# Patient Record
Sex: Male | Born: 1965
Health system: Southern US, Community
[De-identification: ages and names within clinical notes are randomized; demographics above are authoritative.]

## PROBLEM LIST (undated history)

## (undated) DIAGNOSIS — R7303 Prediabetes: Secondary | ICD-10-CM

## (undated) DIAGNOSIS — N529 Male erectile dysfunction, unspecified: Secondary | ICD-10-CM

## (undated) DIAGNOSIS — Z973 Presence of spectacles and contact lenses: Secondary | ICD-10-CM

## (undated) DIAGNOSIS — K219 Gastro-esophageal reflux disease without esophagitis: Secondary | ICD-10-CM

## (undated) DIAGNOSIS — G8929 Other chronic pain: Secondary | ICD-10-CM

## (undated) DIAGNOSIS — I10 Essential (primary) hypertension: Secondary | ICD-10-CM

## (undated) DIAGNOSIS — C61 Malignant neoplasm of prostate: Secondary | ICD-10-CM

## (undated) DIAGNOSIS — M1990 Unspecified osteoarthritis, unspecified site: Secondary | ICD-10-CM

## (undated) DIAGNOSIS — N1330 Unspecified hydronephrosis: Secondary | ICD-10-CM

## (undated) DIAGNOSIS — C801 Malignant (primary) neoplasm, unspecified: Secondary | ICD-10-CM

## (undated) DIAGNOSIS — M1711 Unilateral primary osteoarthritis, right knee: Secondary | ICD-10-CM

## (undated) HISTORY — PX: CHOLECYSTECTOMY: SHX55

## (undated) HISTORY — DX: Malignant (primary) neoplasm, unspecified: C80.1

## (undated) HISTORY — DX: Essential (primary) hypertension: I10

---

## 2003-10-16 ENCOUNTER — Encounter: Payer: Self-pay | Admitting: Emergency Medicine

## 2003-10-16 ENCOUNTER — Encounter: Payer: Self-pay | Admitting: Internal Medicine

## 2003-10-16 ENCOUNTER — Inpatient Hospital Stay (HOSPITAL_COMMUNITY): Admission: EM | Admit: 2003-10-16 | Discharge: 2003-10-18 | Payer: Self-pay | Admitting: Emergency Medicine

## 2003-10-17 ENCOUNTER — Encounter: Payer: Self-pay | Admitting: Cardiology

## 2003-12-14 ENCOUNTER — Emergency Department (HOSPITAL_COMMUNITY): Admission: EM | Admit: 2003-12-14 | Discharge: 2003-12-15 | Payer: Self-pay | Admitting: *Deleted

## 2003-12-30 ENCOUNTER — Emergency Department (HOSPITAL_COMMUNITY): Admission: AD | Admit: 2003-12-30 | Discharge: 2003-12-30 | Payer: Self-pay | Admitting: Family Medicine

## 2006-01-01 ENCOUNTER — Ambulatory Visit: Payer: Self-pay | Admitting: Nurse Practitioner

## 2006-01-13 ENCOUNTER — Ambulatory Visit: Payer: Self-pay | Admitting: Gastroenterology

## 2006-01-27 ENCOUNTER — Ambulatory Visit: Payer: Self-pay | Admitting: Nurse Practitioner

## 2006-01-30 ENCOUNTER — Ambulatory Visit: Payer: Self-pay | Admitting: *Deleted

## 2006-02-04 ENCOUNTER — Encounter (INDEPENDENT_AMBULATORY_CARE_PROVIDER_SITE_OTHER): Payer: Self-pay | Admitting: Specialist

## 2006-02-04 ENCOUNTER — Ambulatory Visit: Payer: Self-pay | Admitting: Gastroenterology

## 2006-02-04 HISTORY — PX: COLONOSCOPY: SHX174

## 2007-08-02 ENCOUNTER — Emergency Department (HOSPITAL_COMMUNITY): Admission: EM | Admit: 2007-08-02 | Discharge: 2007-08-02 | Payer: Self-pay | Admitting: Emergency Medicine

## 2007-08-30 ENCOUNTER — Emergency Department (HOSPITAL_COMMUNITY): Admission: EM | Admit: 2007-08-30 | Discharge: 2007-08-30 | Payer: Self-pay | Admitting: Emergency Medicine

## 2007-10-21 ENCOUNTER — Ambulatory Visit: Payer: Self-pay | Admitting: Internal Medicine

## 2008-01-14 ENCOUNTER — Ambulatory Visit: Payer: Self-pay | Admitting: Internal Medicine

## 2008-01-17 ENCOUNTER — Ambulatory Visit: Payer: Self-pay | Admitting: Internal Medicine

## 2008-01-20 ENCOUNTER — Ambulatory Visit (HOSPITAL_COMMUNITY): Admission: RE | Admit: 2008-01-20 | Discharge: 2008-01-20 | Payer: Self-pay | Admitting: General Surgery

## 2008-01-20 ENCOUNTER — Encounter (INDEPENDENT_AMBULATORY_CARE_PROVIDER_SITE_OTHER): Payer: Self-pay | Admitting: General Surgery

## 2008-01-20 HISTORY — PX: LAPAROSCOPIC CHOLECYSTECTOMY: SUR755

## 2008-04-21 ENCOUNTER — Ambulatory Visit: Payer: Self-pay | Admitting: Internal Medicine

## 2008-04-21 ENCOUNTER — Encounter (INDEPENDENT_AMBULATORY_CARE_PROVIDER_SITE_OTHER): Payer: Self-pay | Admitting: Nurse Practitioner

## 2008-04-21 LAB — CONVERTED CEMR LAB
ALT: 72 units/L — ABNORMAL HIGH (ref 0–53)
AST: 36 units/L (ref 0–37)
Albumin: 4.4 g/dL (ref 3.5–5.2)
Alkaline Phosphatase: 83 units/L (ref 39–117)
Basophils Absolute: 0.1 10*3/uL (ref 0.0–0.1)
Basophils Relative: 1 % (ref 0–1)
Eosinophils Absolute: 0.2 10*3/uL (ref 0.0–0.7)
Eosinophils Relative: 2 % (ref 0–5)
HCT: 49.8 % (ref 39.0–52.0)
LDL Cholesterol: 86 mg/dL (ref 0–99)
MCV: 90.5 fL (ref 78.0–100.0)
Neutrophils Relative %: 53 % (ref 43–77)
Platelets: 282 10*3/uL (ref 150–400)
Potassium: 5.2 meq/L (ref 3.5–5.3)
RDW: 13.5 % (ref 11.5–15.5)
Sodium: 137 meq/L (ref 135–145)
TSH: 0.9 microintl units/mL (ref 0.350–5.50)
Total Bilirubin: 0.5 mg/dL (ref 0.3–1.2)
Total Protein: 8.3 g/dL (ref 6.0–8.3)
Triglycerides: 115 mg/dL (ref <150)
VLDL: 23 mg/dL (ref 0–40)
WBC: 7.6 10*3/uL (ref 4.0–10.5)

## 2008-09-12 ENCOUNTER — Emergency Department (HOSPITAL_COMMUNITY): Admission: EM | Admit: 2008-09-12 | Discharge: 2008-09-13 | Payer: Self-pay | Admitting: Emergency Medicine

## 2009-10-10 ENCOUNTER — Emergency Department (HOSPITAL_COMMUNITY): Admission: EM | Admit: 2009-10-10 | Discharge: 2009-10-10 | Payer: Self-pay | Admitting: Emergency Medicine

## 2009-10-13 ENCOUNTER — Emergency Department (HOSPITAL_COMMUNITY): Admission: EM | Admit: 2009-10-13 | Discharge: 2009-10-13 | Payer: Self-pay | Admitting: Emergency Medicine

## 2010-02-26 ENCOUNTER — Observation Stay (HOSPITAL_COMMUNITY): Admission: EM | Admit: 2010-02-26 | Discharge: 2010-02-28 | Payer: Self-pay | Admitting: Emergency Medicine

## 2010-02-26 ENCOUNTER — Ambulatory Visit: Payer: Self-pay | Admitting: Internal Medicine

## 2010-02-28 ENCOUNTER — Encounter: Payer: Self-pay | Admitting: Internal Medicine

## 2011-03-23 LAB — COMPREHENSIVE METABOLIC PANEL
ALT: 40 U/L (ref 0–53)
AST: 31 U/L (ref 0–37)
Alkaline Phosphatase: 92 U/L (ref 39–117)
CO2: 25 mEq/L (ref 19–32)
Calcium: 9.1 mg/dL (ref 8.4–10.5)
Chloride: 100 mEq/L (ref 96–112)
GFR calc non Af Amer: >60 mL/min (ref >60)
Glucose, Bld: 81 mg/dL (ref 70–99)
Sodium: 137 mEq/L (ref 135–145)
Total Bilirubin: 1 mg/dL (ref 0.3–1.2)

## 2011-03-23 LAB — LIPID PANEL
HDL: 67 mg/dL (ref >39)
Total CHOL/HDL Ratio: 1.7 RATIO
Triglycerides: 67 mg/dL (ref <150)
VLDL: 13 mg/dL (ref 0–40)

## 2011-03-23 LAB — CBC
HCT: 47.5 % (ref 39.0–52.0)
Hemoglobin: 16.3 g/dL (ref 13.0–17.0)
Hemoglobin: 16.5 g/dL (ref 13.0–17.0)
MCHC: 34 g/dL (ref 30.0–36.0)
MCV: 90.7 fL (ref 78.0–100.0)
RBC: 5.15 MIL/uL (ref 4.22–5.81)
RBC: 5.36 MIL/uL (ref 4.22–5.81)

## 2011-03-23 LAB — POCT I-STAT, CHEM 8
Calcium, Ion: 1.03 mmol/L — ABNORMAL LOW (ref 1.12–1.32)
Chloride: 104 mEq/L (ref 96–112)
HCT: 53 % — ABNORMAL HIGH (ref 39.0–52.0)
Hemoglobin: 18 g/dL — ABNORMAL HIGH (ref 13.0–17.0)
Potassium: 3.4 mEq/L — ABNORMAL LOW (ref 3.5–5.1)

## 2011-03-23 LAB — BASIC METABOLIC PANEL
BUN: 11 mg/dL (ref 6–23)
CO2: 25 mEq/L (ref 19–32)
Calcium: 9 mg/dL (ref 8.4–10.5)
Chloride: 103 mEq/L (ref 96–112)
Chloride: 107 mEq/L (ref 96–112)
Creatinine, Ser: 1.13 mg/dL (ref 0.4–1.5)
GFR calc Af Amer: >60 mL/min (ref >60)
GFR calc non Af Amer: >60 mL/min (ref >60)
Glucose, Bld: 90 mg/dL (ref 70–99)
Potassium: 4.2 mEq/L (ref 3.5–5.1)
Sodium: 134 mEq/L — ABNORMAL LOW (ref 135–145)

## 2011-03-23 LAB — URINE DRUGS OF ABUSE SCREEN W ALC, ROUTINE (REF LAB)
Amphetamine Screen, Ur: NEGATIVE
Barbiturate Quant, Ur: NEGATIVE
Ethyl Alcohol: <10 mg/dL (ref <10)
Methadone: NEGATIVE
Phencyclidine (PCP): NEGATIVE

## 2011-03-23 LAB — LIPASE, BLOOD: Lipase: 26 U/L (ref 11–59)

## 2011-03-23 LAB — URINALYSIS, ROUTINE W REFLEX MICROSCOPIC
Bilirubin Urine: NEGATIVE
Ketones, ur: 15 mg/dL — AB
Protein, ur: NEGATIVE mg/dL
pH: 5.5 (ref 5.0–8.0)

## 2011-03-23 LAB — POCT CARDIAC MARKERS: Myoglobin, poc: 128 ng/mL (ref 12–200)

## 2011-03-23 LAB — CARDIAC PANEL(CRET KIN+CKTOT+MB+TROPI)
Relative Index: 1 (ref 0.0–2.5)
Relative Index: 1.1 (ref 0.0–2.5)
Total CK: 311 U/L — ABNORMAL HIGH (ref 7–232)

## 2011-03-23 LAB — OPIATE, QUANTITATIVE, URINE
Oxycodone, ur: NEGATIVE NG/ML
Oxymorphone: NEGATIVE NG/ML

## 2011-03-23 LAB — BRAIN NATRIURETIC PEPTIDE: Pro B Natriuretic peptide (BNP): <30 pg/mL (ref 0.0–100.0)

## 2011-05-13 NOTE — Op Note (Signed)
[name], [name]                ACCOUNT NO.:  [id number]   MEDICAL RECORD NO.:  [id number]          PATIENT TYPE:  AMB   LOCATION:  SDS                          FACILITY:  [facility]   PHYSICIAN:  Cherylynn Ridges, M.D.    DATE OF BIRTH:  [birth year]   DATE OF PROCEDURE:  01/20/2008  DATE OF DISCHARGE:                               OPERATIVE REPORT   PREOPERATIVE DIAGNOSES:  Symptomatic gallstones with liver lesion.   POSTOPERATIVE DIAGNOSES:  Symptomatic gallstones with liver lesion.   PROCEDURE:  Laparoscopic cholecystectomy with cholangiogram.   SURGEON:  Dr. Lindie Spruce.   ASSISTANT:  Dr.  Zachery Dakins.   ANESTHESIA:  General endotracheal.   ESTIMATED BLOOD LOSS:  Less than 20 mL.   COMPLICATIONS:  No complications.   CONDITION:  Good.   FINDINGS:  Gallbladder with adhesions and a normal intraoperative  cholangiogram.  A liver lesion could not be visualized.   INDICATIONS FOR OPERATION:  The patient is a 45 year old whose been  evaluated several times for symptomatic gallstones who now comes in for  an elective laparoscopic cholecystectomy and possible liver biopsy based  on the mass seen on the CT scan done back in August 2008.   OPERATION:  The patient was taken to the operating room, placed on table  in supine position.  After an adequate endotracheal anesthetic was  administered, he was prepped and draped in the usual sterile manner  exposing the midline and right upper quadrant.   The patient had a small partially reducible umbilical hernia.  We made a  supraumbilical curvilinear incision using a #11 blade taken down to the  midline opening.  We are able to get into the peritoneal cavity  partially through the periumbilical hernia which was dissected free and  the fascia controlled with Kocher clamps. We enlarged this opening with  the 15 blade and made a pursestring suture of #0 Vicryl around the  fascial opening. We bluntly dissected down to the peritoneal cavity with  a  Kelly clamp and once we had done this, we passed a Hassan cannula into  the peritoneal cavity which was secured in place with the pursestring  suture.   Carbon dioxide gas was insufflated into the peritoneal cavity up to a  maximal intra-abdominal pressure of 15 mmHg.  The patient was placed in  reverse Trendelenburg and the left-side was tilted down.  Two right  upper quadrant and 5-mm cannulas and a subxiphoid 11/12 mm cannula  passed under direct vision into the peritoneal cavity.  Once this was  done, we started the dissection.   The dome of the gallbladder was retracted towards the anterior abdominal  wall and the right upper quadrant.  There were omental adhesions to the  body of the gallbladder which were dissected away using electrocautery  and blunt dissection.  Further down towards the infundibulum, there were  adhesions pulling up on the duodenum which were dissected away again  with using careful electrocautery and blunt dissection taking care not  to injure the duodenum.  We were subsequently able to mobilize the  infundibulum and then dissect  out the triangle of Calot and hepatic  duodenal triangle identifying both the cystic duct and the cystic  artery.  These were clipped proximally and distally.  The gallbladder  side clip was placed and then a cholecystodocotomy made using  laparoscopic scissors through which a Cook catheter which had been  passed through the anterior abdominal wall was passed in order to  perform the cholangiogram.  The cholangiogram showed good flow into the  duodenum, good proximal filling, no intraductal filling defects and no  ductal dilatation.   Once the cholangiogram was completed, we removed the clip securing the  cholangiocatheter in place and then removed the catheter. We clipped the  distal cystic duct x3 and transected it. The cystic artery was dissected  and a clip proximal and distally with double clips and then transected.  We then  dissected out the gallbladder from its bed with minimal  difficulty not entering into the gallbladder at all.  We retrieved the  gallbladder from the supraumbilical site with minimal difficulty and no  need to enlarge the fascial opening.   Once the gallbladder was removed.  We used a fascial stitch which was in  place to secure the Brainards cannula to close off the fascial opening.  There was no remaining umbilical hernia, although there was entrapped, I  guess, omentum in the umbilicus itself.   Once this was completed, we irrigated with saline solution.  There was  good hemostasis in the hepatic bed.  The lateral cannula sites were  closed using Dermabond. A 0.25% Marcaine with epi was injected at all  sites.  The supraumbilical and subxiphoid sites were closed using  running subcuticular stitch of 4-0 Vicryl.  Again Dermabond was applied  to the skin level there and then Steri-Strips and Tegaderm applied.  All  needle counts, sponge counts and instrument counts were correct.      Cherylynn Ridges, M.D.  Electronically Signed     JOW/MEDQ  D:  01/20/2008  T:  01/20/2008  Job:  [id number]

## 2011-05-16 NOTE — Discharge Summary (Signed)
NAMEJAKORY, [name] NO.:  [id number]   MEDICAL RECORD NO.:  [id number]                   PATIENT TYPE:  INP   LOCATION:  3715                                 FACILITY:  [facility]   PHYSICIAN:  Franklyn Lor, MD                      DATE OF BIRTH:  1966/11/18   DATE OF ADMISSION:  10/16/2003  DATE OF DISCHARGE:  10/18/2003                                 DISCHARGE SUMMARY   CONSULTING PHYSICIAN:  Dr. Juanda Chance of [facility].   DISCHARGE DIAGNOSES:  Chest pain, nonanginal type, new onset.   DISCHARGE MEDICATIONS:  1. Protonix 40 mg 1 p.o. daily.  2. Tylenol 350 mg 1-2 tabs p.o. q.4-6h. as needed for pain.   HISTORY OF PRESENTING ILLNESS:  Mr. [name] is a pleasant, 45 year old  African-American male with no primary care who presented to [facility]  Emergency Department on October 16, 2003 with chief complaint of throbbing  chest pain x1 week.  He reported that the pain was substernal, it comes and  goes. It is not alleviated or exacerbated by any known triggers and does not  radiate. He stated that he woke, the day of admission with this chest pain,  and noted a generalized weakness and sense of feeling dizzy.  He denied  nausea, vomiting, or diarrhea; and denied sick contacts or recent travel.   ADMISSION PHYSICAL EXAMINATION:  VITAL SIGNS: Temperature 98.6, pulse 72,  blood pressure 138/84, respirations 22.  O2 saturation 99% on room air.  GENERAL:  In general the patient was pleasant African-American male in no  apparent distress.  HEENT:  Normal.  CARDIOVASCULAR:  Regular rate and rhythm.  Soft S1, prominent S2.  No  murmurs, rubs, or gallops.  No sternal tenderness.  LUNGS:  Lungs were clear to auscultation bilaterally.  There was no CVA  tenderness.  ABDOMEN:  Somewhat firm, nondistended, only vague diffuse tenderness,  positive bowel sounds.  EXTREMITIES: No edema in the extremities.  2+ pulses, no calf tenderness.   LABS:  EKG at the  time of admission showed normal sinus rhythm per Dr.  Juanda Chance; small inferior Q waves were noted but not considered abnormal.   HOSPITAL COURSE BY PROBLEM:  Problem 1:  CHEST PAIN.  Patient admitted, had  cardiac enzymes x3 which were negative.  Had a 2-D echocardiogram which  showed 55-65% ejection fraction, otherwise normal.  TSH was 1.722 which was  normal.  Urine drug screen negative.  Fasting lipid panel showed cholesterol  185, triglycerides 283, HDL cholesterol 43, LDL cholesterol 85.  Sodium 135,  potassium 4.6, chloride 103, CO2 27, glucose 107, BUN 12, creatinine 1.1,  calcium 9.3.  AST 44, ALT 86, alkaline phosphatase 79, total bilirubin 0.7.  PT 11.2, INR 0.7, PTT 31, D-dimmer 0.27.  White count 8.1, hemoglobin 15.1,  hematocrit 44.7, platelets 292.  The patient had chest  x-ray which showed  stable cardiomegaly, no acute abnormalities.   During the course of his stay the patient experienced only minimal chest  pain and was not clearly able to reproduce his signs or symptoms while an  inpatient.  He was cleared from a cardiac standpoint and Dr. Juanda Chance felt  that an outpatient Cardiolite stress test would be appropriate.    DISPOSITION:  1. The patient scheduled for outpatient Cardiolite on October 21 at 1     o'clock at [address], [facility].  2. The patient was to call [facility] at     [phone] for follow up appointment in 1-2 weeks.                                                Franklyn Lor, MD    TD/MEDQ  D:  10/28/2003  T:  10/28/2003  Job:  [id number]   cc:   Dr. Juanda Chance

## 2011-05-16 NOTE — Consult Note (Signed)
[name], [name] NO.:  [id number]   MEDICAL RECORD NO.:  [id number]                   PATIENT TYPE:  INP   LOCATION:  3715                                 FACILITY:  [facility]   PHYSICIAN:  Charlies Constable, M.D.                  DATE OF BIRTH:  [birth year]   DATE OF CONSULTATION:  10/16/2003  DATE OF DISCHARGE:                                   CONSULTATION   CONSULTING PHYSICIAN:  Dineen Kid. Reche Dixon, M.D.   REASON FOR CONSULTATION:  Evaluation of chest pain.   CLINICAL HISTORY:  Mr. [name] is a 45 year old African-American man with no  prior history of known heart disease who has been having intermittent chest  pain over the past week.  He describes the pain as a substernal throbbing  with sometimes radiation to his back.  The symptoms may last about 30  minutes and then resolve.  There appears to be no relation to exertion.  He  does say he gets short of breath, but has had no nausea or diaphoresis with  these.  He had worse pain today and came to the hospital emergency room and  was admitted for further evaluation.   PAST MEDICAL HISTORY:  Negative for hypertension, diabetes, and  hyperlipidemia.   CURRENT MEDICATIONS:  He is currently on no medications.   PAST SURGICAL HISTORY:  He has had no prior surgery.   SOCIAL HISTORY:  He lives in [place] with his wife.  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  He has been a previous three pack a year smoker  but he quit two years ago.   FAMILY HISTORY:  Positive in his mother died at age 36 of an arrhythmia  after having a pacemaker.  There is no other family history of heart  disease.   For details of family history, social history, and review of systems, please  see [name], P.A. [facility] complete note.   PHYSICAL EXAMINATION:  VITAL SIGNS:  Blood pressure 138/84, pulse 72 and  regular.  NECK:  There was no venous distention.  The carotid pulses were full without  bruits.  CHEST:   Clear.  CARDIAC:  Rhythm was regular.  The first and second heart sounds were  normal.  There were no murmurs or gallops.  CHEST:  Clear without rales or rhonchi.  ABDOMEN:  Soft with normal bowel sounds.  There is no hepatosplenomegaly.  EXTREMITIES:  The peripheral pulses were full and there is no peripheral  edema.  MUSCULOSKELETAL:  No deformities.  NEUROLOGIC:  No focal neurological signs.  SKIN:  Warm and dry.   LABORATORIES:  ECG showed small inferior Q-waves which were not felt to be  abnormal and there were very minor nonspecific ST-T changes.   Troponins were negative x3 and CK-MBs were negative x3.   IMPRESSION:  Chest pain somewhat atypical for  ischemia.   RECOMMENDATIONS:  The patient's symptoms are somewhat atypical for ischemia  in that they are nonexertional and throbbing in nature.  They do not sound  particularly suggestive of GERD or musculoskeletal symptoms either.  I agree  with serial ECGs, enzymes, and recommend that we do a 2-D echocardiogram to  evaluate him for structural heart disease, especially since his mother died  of a probable arrhythmia and if these tests are negative, then will plan an  outpatient Cardiolite scan to evaluate him for an ischemic etiology of his  symptoms.                                               Charlies Constable, M.D.    BB/MEDQ  D:  10/16/2003  T:  10/16/2003  Job:  [id number]

## 2011-09-18 LAB — CBC
HCT: 46.6
Hemoglobin: 15.9
MCHC: 34.1
MCV: 87.8
RBC: 5.31
RDW: 13.1

## 2011-09-18 LAB — DIFFERENTIAL
Basophils Relative: 1
Eosinophils Absolute: 0.2
Eosinophils Relative: 2
Monocytes Absolute: 0.9
Monocytes Relative: 12
Neutrophils Relative %: 54

## 2011-09-18 LAB — HEPATIC FUNCTION PANEL
AST: 53 — ABNORMAL HIGH
Albumin: 3.7
Bilirubin, Direct: 0.4 — ABNORMAL HIGH
Indirect Bilirubin: 0.6
Total Bilirubin: 1
Total Protein: 7.6

## 2011-09-18 LAB — BASIC METABOLIC PANEL
CO2: 31
Chloride: 98
Creatinine, Ser: 1.14
GFR calc Af Amer: >60

## 2011-10-10 LAB — I-STAT 8, (EC8 V) (CONVERTED LAB)
Acid-base deficit: 3 — ABNORMAL HIGH
BUN: 18
Bicarbonate: 23.6
Chloride: 104
HCT: 54 — ABNORMAL HIGH
Hemoglobin: 18.4 — ABNORMAL HIGH
Operator id: 279831
Sodium: 136

## 2011-10-10 LAB — DIFFERENTIAL
Basophils Absolute: 0
Lymphocytes Relative: 16
Lymphs Abs: 1.8
Neutrophils Relative %: 74

## 2011-10-10 LAB — AMYLASE: Amylase: 98

## 2011-10-10 LAB — POCT I-STAT CREATININE: Creatinine, Ser: 1.1

## 2011-10-10 LAB — HEPATIC FUNCTION PANEL
ALT: 34
Albumin: 3.9
Alkaline Phosphatase: 76
Total Protein: 7.5

## 2011-10-10 LAB — LIPASE, BLOOD: Lipase: 30

## 2011-10-10 LAB — CBC
Platelets: 268
WBC: 11 — ABNORMAL HIGH

## 2011-10-13 LAB — DIFFERENTIAL
Eosinophils Absolute: 0.1
Eosinophils Relative: 1
Lymphocytes Relative: 18
Lymphs Abs: 1.5
Monocytes Absolute: 0.8 — ABNORMAL HIGH
Monocytes Relative: 9

## 2011-10-13 LAB — URINALYSIS, ROUTINE W REFLEX MICROSCOPIC
Bilirubin Urine: NEGATIVE
Glucose, UA: NEGATIVE
Hgb urine dipstick: NEGATIVE
Ketones, ur: NEGATIVE
Nitrite: NEGATIVE
Specific Gravity, Urine: 1.021
pH: 6

## 2011-10-13 LAB — COMPREHENSIVE METABOLIC PANEL
ALT: 36
AST: 22
Albumin: 3.7
CO2: 25
Calcium: 9.5
Creatinine, Ser: 1.22
GFR calc Af Amer: >60
GFR calc non Af Amer: >60
Sodium: 135
Total Protein: 7

## 2011-10-13 LAB — LIPASE, BLOOD: Lipase: 29

## 2011-10-13 LAB — CBC
MCHC: 34.1
MCV: 89.9
Platelets: 290
RBC: 5.64

## 2012-09-13 ENCOUNTER — Encounter: Payer: Self-pay | Admitting: Gastroenterology

## 2016-03-17 ENCOUNTER — Emergency Department (INDEPENDENT_AMBULATORY_CARE_PROVIDER_SITE_OTHER)
Admission: EM | Admit: 2016-03-17 | Discharge: 2016-03-17 | Disposition: A | Discharge status: Home / Self Care | Payer: Self-pay | Source: Home / Self Care | Attending: Family Medicine | Admitting: Family Medicine

## 2016-03-17 ENCOUNTER — Encounter (HOSPITAL_COMMUNITY): Payer: Self-pay | Admitting: Emergency Medicine

## 2016-03-17 DIAGNOSIS — R0789 Other chest pain: Secondary | ICD-10-CM

## 2016-03-17 MED ORDER — IBUPROFEN 800 MG PO TABS: 800.0000 mg | ORAL_TABLET | Freq: Three times a day (TID) | ORAL | Status: DC

## 2016-03-17 MED ORDER — CYCLOBENZAPRINE HCL 5 MG PO TABS: 5.0000 mg | ORAL_TABLET | Freq: Three times a day (TID) | ORAL | Status: DC | PRN

## 2016-03-17 NOTE — ED Notes (Signed)
Reports right side pain that started around 11:oo am today.  Patient was vacuuming out a car when pain started.  Cough, deep breathing or movement makes pain extremely bad.  Last bm was today and normal. Denies urinary symptoms

## 2016-03-17 NOTE — ED Provider Notes (Signed)
CSN: [id number]     Arrival date & time 03/17/16  1729 History   First MD Initiated Contact with Patient 03/17/16 1855     Chief Complaint  Patient presents with  . Flank Pain   (Consider location/radiation/quality/duration/timing/severity/associated sxs/prior Treatment) Patient is a 50 y.o. male presenting with flank pain. The history is provided by the patient.  Flank Pain This is a new problem. The current episode started 6 to 12 hours ago (onset while cleaning car this am.). The problem has not changed since onset.Pertinent negatives include no abdominal pain and no shortness of breath.    History reviewed. No pertinent past medical history. Past Surgical History  Procedure Laterality Date  . Cholecystectomy     No family history on file. Social History  Substance Use Topics  . Smoking status: Never Smoker   . Smokeless tobacco: None  . Alcohol Use: No    Review of Systems  Constitutional: Negative.   HENT: Negative.   Respiratory: Negative for shortness of breath.   Cardiovascular: Negative.  Negative for palpitations and leg swelling.  Gastrointestinal: Negative.  Negative for abdominal pain.  Genitourinary: Positive for flank pain.  All other systems reviewed and are negative.   Allergies  Review of patient's allergies indicates no known allergies.  Home Medications   Prior to Admission medications   Medication Sig Start Date End Date Taking? Authorizing Provider  cyclobenzaprine (FLEXERIL) 5 MG tablet Take 1 tablet (5 mg total) by mouth 3 (three) times daily as needed for muscle spasms. 03/17/16   Billy Fischer, MD  ibuprofen (ADVIL,MOTRIN) 800 MG tablet Take 1 tablet (800 mg total) by mouth 3 (three) times daily. For side pain 03/17/16   Billy Fischer, MD   Meds Ordered and Administered this Visit  Medications - No data to display  BP 177/95 mmHg  Pulse 79  Temp(Src) 98.3 F (36.8 C) (Oral)  Resp 12  SpO2 96% No data found.   Physical Exam   Constitutional: He is oriented to person, place, and time. He appears well-developed and well-nourished. No distress.  Neck: Normal range of motion. Neck supple.  Cardiovascular: Normal rate, regular rhythm, normal heart sounds and intact distal pulses.   Pulmonary/Chest: Effort normal and breath sounds normal. He exhibits tenderness.  Abdominal: Soft. Bowel sounds are normal. He exhibits no distension. There is no tenderness.  Neurological: He is alert and oriented to person, place, and time.  Skin: Skin is warm and dry.  Nursing note and vitals reviewed.   ED Course  Procedures (including critical care time)  Labs Review Labs Reviewed - No data to display  Imaging Review No results found.   Visual Acuity Review  Right Eye Distance:   Left Eye Distance:   Bilateral Distance:    Right Eye Near:   Left Eye Near:    Bilateral Near:         MDM   1. Right-sided chest wall pain        Billy Fischer, MD 03/17/16 1924

## 2016-07-23 ENCOUNTER — Encounter (HOSPITAL_COMMUNITY): Payer: Self-pay | Admitting: Family Medicine

## 2016-07-23 ENCOUNTER — Ambulatory Visit (HOSPITAL_COMMUNITY)
Admission: EM | Admit: 2016-07-23 | Discharge: 2016-07-23 | Disposition: A | Discharge status: Home / Self Care | Payer: Self-pay | Attending: Family Medicine | Admitting: Family Medicine

## 2016-07-23 DIAGNOSIS — I10 Essential (primary) hypertension: Secondary | ICD-10-CM

## 2016-07-23 MED ORDER — AMLODIPINE BESYLATE 5 MG PO TABS: 5.0000 mg | ORAL_TABLET | Freq: Every day | ORAL | 0 refills | Status: DC

## 2016-07-23 NOTE — ED Triage Notes (Signed)
Pt here for hypertension. sts that he hasn't been on BP meds since 2002. sts last night he had some numbness to right side and dizziness and checked BP and was 170/75. sts sometimes he has headaches.

## 2016-07-23 NOTE — ED Provider Notes (Signed)
CSN: [id number]     Arrival date & time 07/23/16  1328 History   First MD Initiated Contact with Patient 07/23/16 1420     Chief Complaint  Patient presents with  . Hypertension   (Consider location/radiation/quality/duration/timing/severity/associated sxs/prior Treatment) Patient states he has been having intermittant numbness in hands and shoulders on occasion.  He is concerned about HTN.  He has no PCP.  He is not having any numbnesss today.   The history is provided by the patient.  Hypertension  This is a new problem. The current episode started less than 1 hour ago. The problem occurs constantly. The problem has not changed since onset.Nothing aggravates the symptoms. Nothing relieves the symptoms. He has tried nothing for the symptoms.    History reviewed. No pertinent past medical history. Past Surgical History:  Procedure Laterality Date  . CHOLECYSTECTOMY     History reviewed. No pertinent family history. Social History  Substance Use Topics  . Smoking status: Never Smoker  . Smokeless tobacco: Never Used  . Alcohol use No    Review of Systems  Constitutional: Negative.   HENT: Negative.   Eyes: Negative.   Respiratory: Negative.   Cardiovascular: Negative.   Gastrointestinal: Negative.   Endocrine: Negative.   Genitourinary: Negative.   Musculoskeletal: Negative.   Skin: Negative.   Allergic/Immunologic: Negative.   Neurological: Positive for numbness.  Hematological: Negative.   Psychiatric/Behavioral: Negative.     Allergies  Review of patient's allergies indicates no known allergies.  Home Medications   Prior to Admission medications   Medication Sig Start Date End Date Taking? Authorizing Provider  amLODipine (NORVASC) 5 MG tablet Take 1 tablet (5 mg total) by mouth daily. 07/23/16   Lysbeth Penner, FNP  cyclobenzaprine (FLEXERIL) 5 MG tablet Take 1 tablet (5 mg total) by mouth 3 (three) times daily as needed for muscle spasms. 03/17/16   Billy Fischer, MD  ibuprofen (ADVIL,MOTRIN) 800 MG tablet Take 1 tablet (800 mg total) by mouth 3 (three) times daily. For side pain 03/17/16   Billy Fischer, MD   Meds Ordered and Administered this Visit  Medications - No data to display  BP (!) 158/103 (BP Location: Right Arm)   Pulse 69   Temp 98.1 F (36.7 C) (Oral)   Resp 18   Ht 5\' 10"  (1.778 m)   Wt 230 lb (104.3 kg)   SpO2 98%   BMI 33.00 kg/m  No data found.   Physical Exam  Constitutional: He is oriented to person, place, and time. He appears well-developed and well-nourished.  HENT:  Head: Normocephalic and atraumatic.  Eyes: EOM are normal. Pupils are equal, round, and reactive to light.  Neck: Normal range of motion. Neck supple.  Cardiovascular: Normal rate, regular rhythm and normal heart sounds.   Pulmonary/Chest: Effort normal and breath sounds normal.  Abdominal: Soft. Bowel sounds are normal.  Musculoskeletal: Normal range of motion.  Neurological: He is alert and oriented to person, place, and time. He has normal reflexes.    Urgent Care Course   Clinical Course    Procedures (including critical care time)  Labs Review Labs Reviewed - No data to display  Imaging Review No results found.   Visual Acuity Review  Right Eye Distance:   Left Eye Distance:   Bilateral Distance:    Right Eye Near:   Left Eye Near:    Bilateral Near:         MDM   1. Essential hypertension  Amlodipine 5mg  one po qd #14.  Need to get appointment with PCP.     Lysbeth Penner, FNP 07/23/16 1431

## 2016-09-04 ENCOUNTER — Encounter: Payer: Self-pay | Admitting: Family Medicine

## 2016-09-04 ENCOUNTER — Ambulatory Visit (INDEPENDENT_AMBULATORY_CARE_PROVIDER_SITE_OTHER): Payer: Self-pay | Admitting: Family Medicine

## 2016-09-04 VITALS — BP 160/92 | HR 74 | Temp 98.1°F | Resp 16 | Ht 70.0 in | Wt 247.0 lb

## 2016-09-04 DIAGNOSIS — Z131 Encounter for screening for diabetes mellitus: Secondary | ICD-10-CM

## 2016-09-04 DIAGNOSIS — I10 Essential (primary) hypertension: Secondary | ICD-10-CM

## 2016-09-04 LAB — COMPLETE METABOLIC PANEL WITH GFR
ALBUMIN: 4 g/dL (ref 3.6–5.1)
ALT: 45 U/L (ref 9–46)
AST: 26 U/L (ref 10–35)
Alkaline Phosphatase: 86 U/L (ref 40–115)
BILIRUBIN TOTAL: 0.4 mg/dL (ref 0.2–1.2)
BUN: 15 mg/dL (ref 7–25)
CALCIUM: 9.4 mg/dL (ref 8.6–10.3)
CHLORIDE: 102 mmol/L (ref 98–110)
CO2: 27 mmol/L (ref 20–31)
Creat: 0.86 mg/dL (ref 0.70–1.33)
GFR, Est African American: >89 mL/min (ref >=60)
Glucose, Bld: 86 mg/dL (ref 65–99)
POTASSIUM: 4.1 mmol/L (ref 3.5–5.3)
Sodium: 135 mmol/L (ref 135–146)
Total Protein: 7.6 g/dL (ref 6.1–8.1)

## 2016-09-04 LAB — CBC WITH DIFFERENTIAL/PLATELET
BASOS ABS: 80 {cells}/uL (ref 0–200)
Basophils Relative: 1 %
EOS ABS: 160 {cells}/uL (ref 15–500)
Eosinophils Relative: 2 %
HCT: 43.9 % (ref 38.5–50.0)
Hemoglobin: 14.8 g/dL (ref 13.2–17.1)
LYMPHS PCT: 27 %
Lymphs Abs: 2160 cells/uL (ref 850–3900)
MCH: 29.2 pg (ref 27.0–33.0)
MCHC: 33.7 g/dL (ref 32.0–36.0)
MCV: 86.8 fL (ref 80.0–100.0)
MONOS PCT: 11 %
MPV: 10.3 fL (ref 7.5–12.5)
Monocytes Absolute: 880 cells/uL (ref 200–950)
Neutro Abs: 4720 cells/uL (ref 1500–7800)
Neutrophils Relative %: 59 %
PLATELETS: 296 10*3/uL (ref 140–400)
RBC: 5.06 MIL/uL (ref 4.20–5.80)
RDW: 13.9 % (ref 11.0–15.0)
WBC: 8 10*3/uL (ref 3.8–10.8)

## 2016-09-04 LAB — LIPID PANEL
CHOL/HDL RATIO: 1.9 ratio (ref <=5.0)
Cholesterol: 130 mg/dL (ref 125–200)
HDL: 67 mg/dL (ref >=40)
LDL Cholesterol: 38 mg/dL (ref <130)
Triglycerides: 127 mg/dL (ref <150)
VLDL: 25 mg/dL (ref <30)

## 2016-09-04 MED ORDER — AMLODIPINE BESYLATE 10 MG PO TABS
10.0000 mg | ORAL_TABLET | Freq: Every day | ORAL | 1 refills | Status: DC
Start: 2016-09-04 — End: 2017-01-02

## 2016-09-04 MED FILL — AMLODIPINE BESYLATE 10 MG T: 10 | 90 days supply | Qty: 90 | Fill #0

## 2016-09-04 NOTE — Patient Instructions (Addendum)
Come in for nurse visit for BP check in 2 weeks. Continue low salt diet Try to exercise 3-5 days a week.

## 2016-09-04 NOTE — Progress Notes (Signed)
[name], is a 50 y.o. male  [id number]  [id number]  DOB - 1966-05-26  CC:  Chief Complaint  Patient presents with  . Hypertension       HPI: [name] is a 50 y.o. male here to establish care. He has a history of untreated hypertension. He was seen at Urgent Care and was started on amlodipine 5 mg and referred here. He was only given a 2 week supply and has been out for about 3 weeks. His BP today off medication is 160/92. He denies other complaints except for a soft subq lump on his neck.   Health Maintenance: He is in need of Tdap, flu, colon cancer and prostate cancer screening. We will address that at a future visit. He denies tobacco, alcohol or drug use. He gets no regular exercise but does try to follow a low salt diet.   No Known Allergies Past Medical History:  Diagnosis Date  . Hypertension    Current Outpatient Prescriptions on File Prior to Visit  Medication Sig Dispense Refill  . cyclobenzaprine (FLEXERIL) 5 MG tablet Take 1 tablet (5 mg total) by mouth 3 (three) times daily as needed for muscle spasms. (Patient not taking: Reported on 09/04/2016) 30 tablet 0  . ibuprofen (ADVIL,MOTRIN) 800 MG tablet Take 1 tablet (800 mg total) by mouth 3 (three) times daily. For side pain (Patient not taking: Reported on 09/04/2016) 30 tablet 0   No current facility-administered medications on file prior to visit.    Family History  Problem Relation Age of Onset  . Cancer Father     prostate   . Cancer Maternal Grandfather     prostate   Social History   Social History  . Marital status: Married    Spouse name: N/A  . Number of children: N/A  . Years of education: N/A   Occupational History  . Not on file.   Social History Main Topics  . Smoking status: Former Research scientist (life sciences)  . Smokeless tobacco: Never Used  . Alcohol use No  . Drug use: No  . Sexual activity: Yes   Other Topics Concern  . Not on file   Social History Narrative  . No narrative on file     Review of Systems: Constitutional: Negative Skin: Negative except for lump on neck, present for 4-5 years.  HENT: Negative  Eyes: Negative  Neck: Negative Respiratory: Negative Cardiovascular: Negative Gastrointestinal: Negative Genitourinary: Negative  Musculoskeletal: Negative   Neurological: Negative for Hematological: Negative  Psychiatric/Behavioral: Negative    Objective:   Vitals:   09/04/16 0906  BP: (!) 160/92  Pulse: 74  Resp: 16  Temp: 98.1 F (36.7 C)    Physical Exam: Constitutional: Patient appears well-developed and well-nourished. No distress. HENT: Normocephalic, atraumatic, External right and left ear normal. Oropharynx is clear and moist.  Eyes: Conjunctivae and EOM are normal. PERRLA, no scleral icterus. Neck: Normal ROM. Neck supple. No lymphadenopathy, No thyromegaly. CVS: RRR, S1/S2 +, no murmurs, no gallops, no rubs Pulmonary: Effort and breath sounds normal, no stridor, rhonchi, wheezes, rales.  Abdominal: Soft. Normoactive BS,, no distension, tenderness, rebound or guarding.  Musculoskeletal: Normal range of motion. No edema and no tenderness.  Neuro: Alert.Normal muscle tone coordination. Non-focal Skin: Skin is warm and dry. No rash noted. Not diaphoretic. No erythema. No pallor.There is a soft subq lump aroung 1 inch in diameter, consistent with a lipoma.  Psychiatric: Normal mood and affect. Behavior, judgment, thought content normal.  Lab Results  Component  Value Date   WBC 9.2 02/28/2010   HGB 16.5 02/28/2010   HCT 48.6 02/28/2010   MCV 90.7 02/28/2010   PLT 255 02/28/2010   Lab Results  Component Value Date   CREATININE 1.07 02/28/2010   BUN 11 02/28/2010   NA 134 (L) 02/28/2010   K 3.8 02/28/2010   CL 103 02/28/2010   CO2 25 02/28/2010    Lab Results  Component Value Date   HGBA1C  02/26/2010    6.0 (NOTE) The ADA recommends the following therapeutic goal for glycemic control related to Hgb A1c measurement: Goal of  therapy: <6.5 Hgb A1c  Reference: American Diabetes Association: Clinical Practice Recommendations 2010, Diabetes Care, 2010, 33: (Suppl  1).   Lipid Panel     Component Value Date/Time   CHOL  02/26/2010 1311    113        ATP III CLASSIFICATION:  <200     mg/dL   Desirable  200-239  mg/dL   Borderline High  >=240    mg/dL   High          TRIG 67 02/26/2010 1311   HDL 67 02/26/2010 1311   CHOLHDL 1.7 02/26/2010 1311   VLDL 13 02/26/2010 1311   LDLCALC  02/26/2010 1311    33        Total Cholesterol/HDL:CHD Risk Coronary Heart Disease Risk Table                     Men   Women  1/2 Average Risk   3.4   3.3  Average Risk       5.0   4.4  2 X Average Risk   9.6   7.1  3 X Average Risk  23.4   11.0        Use the calculated Patient Ratio above and the CHD Risk Table to determine the patient's CHD Risk.        ATP III CLASSIFICATION (LDL):  <100     mg/dL   Optimal  100-129  mg/dL   Near or Above                    Optimal  130-159  mg/dL   Borderline  160-189  mg/dL   High  >190     mg/dL   Very High       Assessment and plan:   1. Essential hypertension  - COMPLETE METABOLIC PANEL WITH GFR - CBC with Differential - Lipid panel - Hemoglobin A1c -re-enforced low salt diet. -recommend exercising 3-5 days a week.  2. Screening for diabetes mellitus  - Hemoglobin A1c   Return in about 2 weeks (around 09/18/2016) for BP check with nurse..  The patient was given clear instructions to go to ER or return to medical center if symptoms don't improve, worsen or new problems develop. The patient verbalized understanding.    Micheline Chapman FNP  09/04/2016, 9:40 AM

## 2016-09-05 LAB — HEMOGLOBIN A1C
HEMOGLOBIN A1C: 5.7 % — AB (ref <5.7)
MEAN PLASMA GLUCOSE: 117 mg/dL

## 2016-09-05 NOTE — Progress Notes (Signed)
Called, patient unavailable at this time. Will try later. Thanks!

## 2016-09-19 ENCOUNTER — Ambulatory Visit: Payer: Self-pay

## 2016-09-19 VITALS — BP 135/85 | HR 82

## 2016-09-19 DIAGNOSIS — I10 Essential (primary) hypertension: Secondary | ICD-10-CM

## 2016-12-30 MED FILL — AMLODIPINE BESYLATE 10 MG T: 10 | 90 days supply | Qty: 90 | Fill #1

## 2017-01-02 ENCOUNTER — Encounter: Payer: Self-pay | Admitting: Family Medicine

## 2017-01-02 ENCOUNTER — Ambulatory Visit (INDEPENDENT_AMBULATORY_CARE_PROVIDER_SITE_OTHER): Payer: Self-pay | Admitting: Family Medicine

## 2017-01-02 VITALS — BP 140/78 | HR 76 | Temp 98.1°F | Resp 16 | Ht 70.0 in | Wt 257.0 lb

## 2017-01-02 DIAGNOSIS — R7303 Prediabetes: Secondary | ICD-10-CM

## 2017-01-02 DIAGNOSIS — H6123 Impacted cerumen, bilateral: Secondary | ICD-10-CM

## 2017-01-02 DIAGNOSIS — M25531 Pain in right wrist: Secondary | ICD-10-CM

## 2017-01-02 DIAGNOSIS — Z23 Encounter for immunization: Secondary | ICD-10-CM

## 2017-01-02 DIAGNOSIS — I10 Essential (primary) hypertension: Secondary | ICD-10-CM

## 2017-01-02 DIAGNOSIS — H6120 Impacted cerumen, unspecified ear: Secondary | ICD-10-CM | POA: Insufficient documentation

## 2017-01-02 LAB — BASIC METABOLIC PANEL
BUN: 11 mg/dL (ref 7–25)
CHLORIDE: 100 mmol/L (ref 98–110)
CO2: 29 mmol/L (ref 20–31)
CREATININE: 0.87 mg/dL (ref 0.70–1.33)
Calcium: 9.7 mg/dL (ref 8.6–10.3)
GLUCOSE: 49 mg/dL — AB (ref 65–99)
POTASSIUM: 4.8 mmol/L (ref 3.5–5.3)
Sodium: 137 mmol/L (ref 135–146)

## 2017-01-02 LAB — POCT GLYCOSYLATED HEMOGLOBIN (HGB A1C): Hemoglobin A1C: 5.7

## 2017-01-02 MED ORDER — KETOROLAC TROMETHAMINE 60 MG/2ML IM SOLN
60.0000 mg | Freq: Once | INTRAMUSCULAR | Status: AC
  Administered 2017-01-02: 60 mg via INTRAMUSCULAR

## 2017-01-02 MED ORDER — MELOXICAM 7.5 MG PO TABS: 7.5000 mg | ORAL_TABLET | Freq: Every day | ORAL | 0 refills | Status: DC

## 2017-01-02 MED ORDER — AMLODIPINE BESYLATE 10 MG PO TABS
10.0000 mg | ORAL_TABLET | Freq: Every day | ORAL | 1 refills | Status: DC
Start: 2017-01-02 — End: 2017-04-02

## 2017-01-02 MED FILL — ?MELOXICAM 7.5 MG TABLET: 7.5 | 30 days supply | Qty: 30 | Fill #0

## 2017-01-02 NOTE — Patient Instructions (Addendum)
Earwax Buildup Your ears make a substance called earwax. It may also be called cerumen. Sometimes, too much earwax builds up in your ear canal. This can cause ear pain and make it harder for you to hear. CAUSES This condition is caused by too much earwax production or buildup. RISK FACTORS The following factors may make you more likely to develop this condition:  Cleaning your ears often with swabs.  Having narrow ear canals.  Having earwax that is overly thick or sticky.  Having eczema.  Being dehydrated. SYMPTOMS Symptoms of this condition include:  Reduced hearing.  Ear drainage.  Ear pain.  Ear itch.  A feeling of fullness in the ear or feeling that the ear is plugged.  Ringing in the ear.  Coughing. DIAGNOSIS Your health care provider can diagnose this condition based on your symptoms and medical history. Your health care provider will also do an ear exam to look inside your ear with a scope (otoscope). You may also have a hearing test. TREATMENT Treatment for this condition includes:  Over-the-counter or prescription ear drops to soften the earwax.  Earwax removal by a health care provider. This may be done:  By flushing the ear with body-temperature water.  With a medical instrument that has a loop at the end (earwax curette).  With a suction device. HOME CARE INSTRUCTIONS  Take over-the-counter and prescription medicines only as told by your health care provider.  Do not put any objects, including an ear swab, into your ear. You can clean the opening of your ear canal with a washcloth.  Drink enough water to keep your urine clear or pale yellow.  If you have frequent earwax buildup or you use hearing aids, consider seeing your health care provider every 6-12 months for routine preventive ear cleanings. Keep all follow-up visits as told by your health care provider. SEEK MEDICAL CARE IF:  You have ear pain.  Your condition does not improve with  treatment.  You have hearing loss.  You have blood, pus, or other fluid coming from your ear. This information is not intended to replace advice given to you by your health care provider. Make sure you discuss any questions you have with your health care provider. Document Released: 01/22/2005 Document Revised: 04/07/2016 Document Reviewed: 08/01/2015 Elsevier Interactive Patient Education  2017 [place] [facility] Introduction Joint pain can be caused by many things. The joint can be bruised, infected, weak from aging, or sore from exercise. The pain will probably go away if you follow your doctor's instructions for home care. If your joint pain continues, more tests may be needed to help find the cause of your condition. Follow these instructions at home: Watch your condition for any changes. Follow these instructions as told to lessen the pain that you are feeling:  Take medicines only as told by your doctor.  Rest the sore joint for as long as told by your doctor. If your doctor tells you to, raise (elevate) the painful joint above the level of your heart while you are sitting or lying down.  Do not do things that cause pain or make the pain worse.  If told, put ice on the painful area:  Put ice in a plastic bag.  Place a towel between your skin and the bag.  Leave the ice on for 20 minutes, 2-3 times per day.  Wear an elastic bandage, splint, or sling as told by your doctor. Loosen the bandage or splint if your fingers  or toes lose feeling (become numb) and tingle, or if they turn cold and blue.  Begin exercising or stretching the joint as told by your doctor. Ask your doctor what types of exercise are safe for you.  Keep all follow-up visits as told by your doctor. This is important. Contact a doctor if:  Your pain gets worse and medicine does not help it.  Your joint pain does not get better in 3 days.  You have more bruising or swelling.  You have a  fever.  You lose 10 pounds (4.5 kg) or more without trying. Get help right away if:  You are not able to move the joint.  Your fingers or toes become numb or they turn cold and blue. This information is not intended to replace advice given to you by your health care provider. Make sure you discuss any questions you have with your health care provider. Document Released: 12/03/2009 Document Revised: 05/22/2016 Document Reviewed: 09/26/2014  2017 Elsevier  Managing Your Hypertension Hypertension is commonly called high blood pressure. Blood pressure is a measurement of how strongly your blood is pressing against the walls of your arteries. Arteries are blood vessels that carry blood from your heart throughout your body. Blood pressure does not stay the same. It rises when you are active, excited, or nervous. It lowers when you are sleeping or relaxed. If the numbers that measure your blood pressure stay above normal most of the time, you are at risk for health problems. Hypertension is a long-term (chronic) condition in which blood pressure is elevated. This condition often has no signs or symptoms. The cause of the condition is usually not known. What are blood pressure readings? A blood pressure reading is recorded as two numbers, such as "120 over 80" (or 120/80). The first ("top") number is called the systolic pressure. It is a measure of the pressure in your arteries as the heart beats. The second ("bottom") number is called the diastolic pressure. It is a measure of the pressure in your arteries as the heart relaxes between beats. What does my blood pressure reading mean? Blood pressure is classified into four stages. Based on your blood pressure reading, your health care provider may use the following stages to determine what type of treatment, if any, is needed. Systolic pressure and diastolic pressure are measured in a unit called mm Hg. Normal  Systolic pressure: below 123456.  Diastolic  pressure: below 80. Prehypertension  Systolic pressure: 123456.  Diastolic pressure: [id number]. Hypertension stage 1  Systolic pressure: A999333.  Diastolic pressure: A999333. Hypertension stage 2  Systolic pressure: 0000000 or above.  Diastolic pressure: 123XX123 or above. What health risks are associated with hypertension? Managing your hypertension is an important responsibility. Uncontrolled hypertension can lead to:  A heart attack.  A stroke.  A weakened blood vessel (aneurysm).  Heart failure.  Kidney damage.  Eye damage.  Metabolic syndrome.  Memory and concentration problems. What changes can I make to manage my hypertension? Hypertension can be managed effectively by making lifestyle changes and possibly by taking medicines. Your health care provider will help you come up with a plan to bring your blood pressure within a normal range. Your plan should include the following: Monitoring  Monitor your blood pressure at home as told by your health care provider. Your personal target blood pressure may vary depending on your medical conditions, your age, and other factors.  Have your blood pressure rechecked as told by your health care provider. Lifestyle  Lose weight if necessary.  Get at least 30-45 minutes of aerobic exercise at least 4 times a week.  Do not use any products that contain nicotine or tobacco, such as cigarettes and e-cigarettes. If you need help quitting, ask your health care provider.  Learn ways to reduce stress.  Control any chronic conditions, such as high cholesterol or diabetes. Eating and drinking  Follow the DASH diet. This diet is high in fruits, vegetables, and whole grains. It is low in salt, red meat, and added sugars.  Keep your sodium intake below 2,300 mg per day.  Limit alcoholic beverages. Communication  Review all the medicines you take with your health care provider because there may be side effects or interactions.  Talk  with your health care provider about your diet, exercise habits, and other lifestyle factors that may be contributing to hypertension.  See your health care provider regularly. Your health care provider can help you create and adjust your plan for managing hypertension. Will I need medicine to control my blood pressure? Your health care provider may prescribe medicine if lifestyle changes are not enough to get your blood pressure under control, and if one of the following is true:  You are 41-22 years of age, and your systolic blood pressure is 140 or higher.  You are 45 years of age or older, and your systolic blood pressure is 150 or higher.  Your diastolic blood pressure is 90 or higher.  You have diabetes, and your systolic blood pressure is over [id number] or your diastolic blood pressure is over 90.  You have kidney disease, and your blood pressure is above 140/90.  You have heart disease or a history of stroke, and your blood pressure is 140/90 or higher. Take medicines only as told by your health care provider. Follow the directions carefully. Blood pressure medicines must be taken as prescribed. The medicine does not work as well when you skip doses. Skipping doses also puts you at risk for problems. Contact a health care provider if:  You think you are having a reaction to medicines you have taken.  You have repeated (recurrent) headaches.  You feel dizzy.  You have swelling in your ankles.  You have trouble with your vision. Get help right away if:  You develop a severe headache or confusion.  You have unusual weakness or numbness, or you feel faint.  You have severe pain in your chest or abdomen.  You vomit repeatedly.  You have trouble breathing. This information is not intended to replace advice given to you by your health care provider. Make sure you discuss any questions you have with your health care provider. Document Released: 09/08/2012 Document Revised:  08/19/2016 Document Reviewed: 03/14/2016 Elsevier Interactive Patient Education  2017 [employer].

## 2017-01-02 NOTE — Progress Notes (Signed)
Subjective:    Patient ID: [name], male    DOB: 1966-07-28, 51 y.o.   MRN: [id number]  Hypertension  This is a chronic problem. The current episode started more than 1 year ago. The problem is controlled. Pertinent negatives include no anxiety, blurred vision, chest pain, headaches, malaise/fatigue, neck pain, orthopnea, palpitations, peripheral edema, PND, shortness of breath or sweats. There are no associated agents to hypertension. Risk factors for coronary artery disease include obesity and sedentary lifestyle. Past treatments include calcium channel blockers. There are no compliance problems.  There is no history of angina, kidney disease, CAD/MI, CVA, heart failure, left ventricular hypertrophy, PVD, renovascular disease, retinopathy or a thyroid problem. There is no history of chronic renal disease, coarctation of the aorta, hyperaldosteronism, hypercortisolism, hyperparathyroidism, a hypertension causing med, pheochromocytoma or sleep apnea.  Arm Pain   The pain is present in the right forearm. The quality of the pain is described as aching. The pain does not radiate. The pain is at a severity of 7/10. The pain is moderate. The pain has been constant since the incident. Pertinent negatives include no chest pain, muscle weakness, numbness or tingling. He has tried nothing for the symptoms.    Past Medical History:  Diagnosis Date  . Hypertension    Social History   Social History  . Marital status: Married    Spouse name: N/A  . Number of children: N/A  . Years of education: N/A   Occupational History  . Not on file.   Social History Main Topics  . Smoking status: Former Research scientist (life sciences)  . Smokeless tobacco: Never Used  . Alcohol use No  . Drug use: No  . Sexual activity: Yes   Other Topics Concern  . Not on file   Social History Narrative  . No narrative on file   No Known Allergies Immunization History  Administered Date(s) Administered  . Tdap 01/02/2017   Review  of Systems  Constitutional: Negative.  Negative for malaise/fatigue.  HENT: Negative.   Eyes: Negative.  Negative for blurred vision.  Respiratory: Negative.  Negative for shortness of breath.   Cardiovascular: Negative.  Negative for chest pain, palpitations, orthopnea and PND.  Gastrointestinal: Negative.   Endocrine: Negative.   Genitourinary: Negative.   Musculoskeletal: Negative.  Negative for neck pain.  Skin: Negative.   Neurological: Negative.  Negative for tingling, numbness and headaches.  Psychiatric/Behavioral: Negative.        Objective:   Physical Exam  Constitutional: He is oriented to person, place, and time. He appears well-developed.  HENT:  Head: Normocephalic and atraumatic.  Right Ear: External ear normal.  Left Ear: External ear normal.  Nose: Nose normal.  Mouth/Throat: Oropharynx is clear and moist.  Cerumen impaction bilaterally  Eyes: Conjunctivae and EOM are normal. Pupils are equal, round, and reactive to light.  Neck: Normal range of motion. Neck supple.  Cardiovascular: Normal rate, regular rhythm, normal heart sounds and intact distal pulses.   Pulmonary/Chest: Effort normal and breath sounds normal.  Abdominal: Soft. Bowel sounds are normal.  Musculoskeletal:       Right elbow: He exhibits decreased range of motion.  Neurological: He is alert and oriented to person, place, and time. He has normal reflexes.  Skin: Skin is warm and dry.  Psychiatric: He has a normal mood and affect. His behavior is normal. Judgment and thought content normal.      BP 140/78 (BP Location: Right Arm, Patient Position: Sitting, Cuff Size: Large)   Pulse  76   Temp 98.1 F (36.7 C) (Oral)   Resp 16   Ht 5\' 10"  (1.778 m)   Wt 257 lb (116.6 kg)   SpO2 98%   BMI 36.88 kg/m  Assessment & Plan:    1. Essential hypertension Blood pressure is above goal on current medication regimen. The patient is asked to make an attempt to improve diet and exercise patterns to  aid in medical management of this problem. - Basic Metabolic Panel - amLODipine (NORVASC) 10 MG tablet; Take 1 tablet (10 mg total) by mouth daily.  Dispense: 90 tablet; Refill: 1  2. Prediabetes Recommend a lowfat, low carbohydrate diet divided over 5-6 small meals, increase water intake to 6-8 glasses, and 150 minutes per week of cardiovascular exercise.   - HgB A1c  3. Bilateral impacted cerumen TM visualized after ear irrigation.  - Ear Lavage  4. Pain, joint, forearm, right - ketorolac (TORADOL) injection 60 mg; Inject 2 mLs (60 mg total) into the muscle once. - meloxicam (MOBIC) 7.5 MG tablet; Take 1 tablet (7.5 mg total) by mouth daily.  Dispense: 30 tablet; Refill: 0 - Sedimentation Rate - C-reactive protein  5. Need for Tdap vaccination - Tdap vaccine greater than or equal to 7yo IM    Preventative maintenance:  Prostate exam recommended in 3 months   RTC: 3 months for hypertension   Troyce Gieske M, FNP

## 2017-01-03 ENCOUNTER — Encounter (HOSPITAL_COMMUNITY): Payer: Self-pay | Admitting: *Deleted

## 2017-01-03 ENCOUNTER — Emergency Department (HOSPITAL_COMMUNITY)
Admission: EM | Admit: 2017-01-03 | Discharge: 2017-01-03 | Disposition: A | Discharge status: Home / Self Care | Payer: Self-pay | Attending: Emergency Medicine | Admitting: Emergency Medicine

## 2017-01-03 DIAGNOSIS — M7711 Lateral epicondylitis, right elbow: Secondary | ICD-10-CM | POA: Insufficient documentation

## 2017-01-03 DIAGNOSIS — I10 Essential (primary) hypertension: Secondary | ICD-10-CM | POA: Insufficient documentation

## 2017-01-03 DIAGNOSIS — Z87891 Personal history of nicotine dependence: Secondary | ICD-10-CM | POA: Insufficient documentation

## 2017-01-03 DIAGNOSIS — Z79899 Other long term (current) drug therapy: Secondary | ICD-10-CM | POA: Insufficient documentation

## 2017-01-03 LAB — SEDIMENTATION RATE: Sed Rate: 18 mm/hr (ref 0–20)

## 2017-01-03 MED ORDER — NAPROXEN 500 MG PO TABS: 500.0000 mg | ORAL_TABLET | Freq: Two times a day (BID) | ORAL | 0 refills | Status: DC

## 2017-01-03 MED ORDER — METHOCARBAMOL 500 MG PO TABS: 500.0000 mg | ORAL_TABLET | Freq: Two times a day (BID) | ORAL | 0 refills | Status: DC

## 2017-01-03 NOTE — ED Triage Notes (Addendum)
Pt states R arm pain from elbow to hand since Monday.  Denies injury.  States "It feels like a tooth ache in my arm".  Works at a [employer]. Seen at [facility] outpatient yesterday and was given a shot with no relief.

## 2017-01-03 NOTE — Discharge Instructions (Addendum)
Your pain is suspected to be connected to inflammation from overuse of the arm.   The treatment for this mostly involves resting the arm and using it less. Some activity is good, however, and can prevent the opposite problem of stiffness Take 500 mg of naproxen every 12 hours or 800 mg of ibuprofen every 8 hours for the next 3 days. Take these medications with food to avoid upset stomach. Robaxin is a muscle relaxer and may help loosen stiff muscles. Do not take the Robaxin while driving or performing other dangerous activities. Be sure to perform the attached exercises starting with three times a week and working up to performing them daily. This is an essential part of preventing long term problems. Follow up with a primary care provider for any future management of these complaints.  Please note that if you begin taking the ibuprofen or naproxen, you should stop taking the meloxicam.

## 2017-01-03 NOTE — ED Notes (Signed)
Declined W/C at D/C and was escorted to lobby by RN. 

## 2017-01-03 NOTE — ED Provider Notes (Signed)
[facility] DEPT Provider Note   CSN: [id number] Arrival date & time: 01/03/17  [id number]  By signing my name below, I, Sonum Patel, attest that this documentation has been prepared under the direction and in the presence of Jakeel Starliper, PA-C. Electronically Signed: Sonum Patel, Education administrator. 01/03/17. 10:17 AM.  History   Chief Complaint Chief Complaint  Patient presents with  . Arm Pain    The history is provided by the patient. No language interpreter was used.    HPI Comments: Dev Bilderback is a 51 y.o. male who presents to the Emergency Department complaining of unchanged, aching right upper forearm and elbow pain that began about 5 days ago. He states the pain is worse with bending or stretching the affected area. He states he recently started a new job at a car [employer] which involves repetitive motions of the affected extremity. He is right hand dominant. He was seen by his PCP yesterday and was given a Toradol injection and discharged home with meloxicam. He denies swelling, trauma, numbness, weakness, or any other complaints.     Past Medical History:  Diagnosis Date  . Hypertension     Patient Active Problem List   Diagnosis Date Noted  . Prediabetes 01/02/2017  . Cerumen impaction 01/02/2017  . Pain, joint, forearm, right 01/02/2017  . Essential hypertension 09/04/2016    Past Surgical History:  Procedure Laterality Date  . CHOLECYSTECTOMY         Home Medications    Prior to Admission medications   Medication Sig Start Date End Date Taking? Authorizing Provider  amLODipine (NORVASC) 10 MG tablet Take 1 tablet (10 mg total) by mouth daily. 01/02/17   Dorena Dew, FNP  meloxicam (MOBIC) 7.5 MG tablet Take 1 tablet (7.5 mg total) by mouth daily. 01/02/17   Dorena Dew, FNP  methocarbamol (ROBAXIN) 500 MG tablet Take 1 tablet (500 mg total) by mouth 2 (two) times daily. 01/03/17   Le Faulcon C Wilberta Dorvil, PA-C  naproxen (NAPROSYN) 500 MG tablet Take 1 tablet (500 mg total) by mouth  2 (two) times daily. 01/03/17   Lorayne Bender, PA-C    Family History Family History  Problem Relation Age of Onset  . Cancer Father     prostate   . Cancer Maternal Grandfather     prostate    Social History Social History  Substance Use Topics  . Smoking status: Former Research scientist (life sciences)  . Smokeless tobacco: Never Used  . Alcohol use No     Allergies   Patient has no known allergies.   Review of Systems Review of Systems  Musculoskeletal: Positive for arthralgias and myalgias. Negative for joint swelling.  Neurological: Negative for weakness and numbness.     Physical Exam Updated Vital Signs BP 142/86 (BP Location: Right Arm)   Pulse 83   Temp 97.7 F (36.5 C) (Oral)   Resp 16   Ht 5\' 10"  (1.778 m)   Wt 257 lb (116.6 kg)   SpO2 98%   BMI 36.88 kg/m   Physical Exam  Constitutional: He appears well-developed and well-nourished. No distress.  HENT:  Head: Normocephalic and atraumatic.  Eyes: Conjunctivae are normal.  Neck: Neck supple.  Cardiovascular: Normal rate, regular rhythm and intact distal pulses.   Pulmonary/Chest: Effort normal.  Musculoskeletal: He exhibits tenderness. He exhibits no edema or deformity.  Tenderness over lateral surface of right elbow. Full ROM in the right elbow, wrist, and hand. Flexion and extension of the right fingers is intact. ROM of  right wrist through cardinal directions intact. No swelling, erythema, increased warmth, or pain out of proportion.   Neurological: He is alert. He has normal strength. No sensory deficit.  No sensory deficits. Strength is 5/5. Can spread fingers of the right hand against resistance.  Skin: Skin is warm and dry. Capillary refill takes less than 2 seconds. He is not diaphoretic.  Psychiatric: He has a normal mood and affect. His behavior is normal.  Nursing note and vitals reviewed.    ED Treatments / Results  DIAGNOSTIC STUDIES: Oxygen Saturation is 98% on RA, normal by my interpretation.     COORDINATION OF CARE: 10:23 AM Discussed treatment plan with pt at bedside and pt agreed to plan.    Labs (all labs ordered are listed, but only abnormal results are displayed) Labs Reviewed - No data to display  EKG  EKG Interpretation None       Radiology No results found.  Procedures Procedures (including critical care time)  Medications Ordered in ED Medications - No data to display   Initial Impression / Assessment and Plan / ED Course  I have reviewed the triage vital signs and the nursing notes.  Pertinent labs & imaging results that were available during my care of the patient were reviewed by me and considered in my medical decision making (see chart for details).  Clinical Course     Patient presents with right elbow and upper forearm pain. Suspect lateral epicondylitis. Very low suspicion for septic joint or DVT. Home therapy and return precautions discussed. PCP follow-up for chronic management. Patient voiced understanding of all instructions and is comfortable with discharge.   Final Clinical Impressions(s) / ED Diagnoses   Final diagnoses:  Lateral epicondylitis of right elbow    New Prescriptions Discharge Medication List as of 01/03/2017 10:32 AM    START taking these medications   Details  methocarbamol (ROBAXIN) 500 MG tablet Take 1 tablet (500 mg total) by mouth 2 (two) times daily., Starting Sat 01/03/2017, Print    naproxen (NAPROSYN) 500 MG tablet Take 1 tablet (500 mg total) by mouth 2 (two) times daily., Starting Sat 01/03/2017, Print       I personally performed the services described in this documentation, which was scribed in my presence. The recorded information has been reviewed and is accurate.   Lorayne Bender, PA-C 01/03/17 [place], MD 01/03/17 [phone]

## 2017-01-05 LAB — POCT URINALYSIS DIP (DEVICE)
BILIRUBIN URINE: NEGATIVE
Glucose, UA: NEGATIVE mg/dL
Hgb urine dipstick: NEGATIVE
KETONES UR: NEGATIVE mg/dL
Leukocytes, UA: NEGATIVE
Nitrite: NEGATIVE
PH: 7.5 (ref 5.0–8.0)
Protein, ur: NEGATIVE mg/dL
SPECIFIC GRAVITY, URINE: 1.02 (ref 1.005–1.030)
Urobilinogen, UA: 1 mg/dL (ref 0.0–1.0)

## 2017-01-05 LAB — C-REACTIVE PROTEIN: CRP: 1 mg/L (ref <8.0)

## 2017-01-05 MED FILL — METHOCARBAMOL 500 MG TABLET: 500 | 10 days supply | Qty: 20 | Fill #0

## 2017-04-02 ENCOUNTER — Ambulatory Visit (INDEPENDENT_AMBULATORY_CARE_PROVIDER_SITE_OTHER): Payer: Self-pay | Admitting: Family Medicine

## 2017-04-02 ENCOUNTER — Encounter: Payer: Self-pay | Admitting: Family Medicine

## 2017-04-02 VITALS — BP 136/86 | HR 73 | Temp 97.7°F | Resp 16 | Ht 70.0 in | Wt 263.0 lb

## 2017-04-02 DIAGNOSIS — I10 Essential (primary) hypertension: Secondary | ICD-10-CM

## 2017-04-02 LAB — POCT URINALYSIS DIP (DEVICE)
BILIRUBIN URINE: NEGATIVE
GLUCOSE, UA: NEGATIVE mg/dL
HGB URINE DIPSTICK: NEGATIVE
Ketones, ur: NEGATIVE mg/dL
LEUKOCYTES UA: NEGATIVE
Nitrite: NEGATIVE
Protein, ur: NEGATIVE mg/dL
SPECIFIC GRAVITY, URINE: 1.015 (ref 1.005–1.030)
Urobilinogen, UA: 0.2 mg/dL (ref 0.0–1.0)
pH: 6 (ref 5.0–8.0)

## 2017-04-02 MED ORDER — AMLODIPINE BESYLATE 10 MG PO TABS: 10.0000 mg | ORAL_TABLET | Freq: Every day | ORAL | 1 refills | Status: DC

## 2017-04-02 MED FILL — AMLODIPINE BESYLATE 10 MG T: 10 | 90 days supply | Qty: 90 | Fill #0

## 2017-04-02 NOTE — Patient Instructions (Signed)
Continue Amlodipine 10 mg daily. Recommend DASH diet.   DASH Eating Plan DASH stands for "Dietary Approaches to Stop Hypertension." The DASH eating plan is a healthy eating plan that has been shown to reduce high blood pressure (hypertension). It may also reduce your risk for type 2 diabetes, heart disease, and stroke. The DASH eating plan may also help with weight loss. What are tips for following this plan? General guidelines   Avoid eating more than 2,300 mg (milligrams) of salt (sodium) a day. If you have hypertension, you may need to reduce your sodium intake to 1,500 mg a day.  Limit alcohol intake to no more than 1 drink a day for nonpregnant women and 2 drinks a day for men. One drink equals 12 oz of beer, 5 oz of wine, or 1 oz of hard liquor.  Work with your health care provider to maintain a healthy body weight or to lose weight. Ask what an ideal weight is for you.  Get at least 30 minutes of exercise that causes your heart to beat faster (aerobic exercise) most days of the week. Activities may include walking, swimming, or biking.  Work with your health care provider or diet and nutrition specialist (dietitian) to adjust your eating plan to your individual calorie needs. Reading food labels   Check food labels for the amount of sodium per serving. Choose foods with less than 5 percent of the Daily Value of sodium. Generally, foods with less than 300 mg of sodium per serving fit into this eating plan.  To find whole grains, look for the word "whole" as the first word in the ingredient list. Shopping   Buy products labeled as "low-sodium" or "no salt added."  Buy fresh foods. Avoid canned foods and premade or frozen meals. Cooking   Avoid adding salt when cooking. Use salt-free seasonings or herbs instead of table salt or sea salt. Check with your health care provider or pharmacist before using salt substitutes.  Do not fry foods. Cook foods using healthy methods such as  baking, boiling, grilling, and broiling instead.  Cook with heart-healthy oils, such as olive, canola, soybean, or sunflower oil. Meal planning    Eat a balanced diet that includes:  5 or more servings of fruits and vegetables each day. At each meal, try to fill half of your plate with fruits and vegetables.  Up to 6-8 servings of whole grains each day.  Less than 6 oz of lean meat, poultry, or fish each day. A 3-oz serving of meat is about the same size as a deck of cards. One egg equals 1 oz.  2 servings of low-fat dairy each day.  A serving of nuts, seeds, or beans 5 times each week.  Heart-healthy fats. Healthy fats called Omega-3 fatty acids are found in foods such as flaxseeds and coldwater fish, like sardines, salmon, and mackerel.  Limit how much you eat of the following:  Canned or prepackaged foods.  Food that is high in trans fat, such as fried foods.  Food that is high in saturated fat, such as fatty meat.  Sweets, desserts, sugary drinks, and other foods with added sugar.  Full-fat dairy products.  Do not salt foods before eating.  Try to eat at least 2 vegetarian meals each week.  Eat more home-cooked food and less restaurant, buffet, and fast food.  When eating at a restaurant, ask that your food be prepared with less salt or no salt, if possible. What foods are recommended?  The items listed may not be a complete list. Talk with your dietitian about what dietary choices are best for you. Grains  Whole-grain or whole-wheat bread. Whole-grain or whole-wheat pasta. Brown rice. [place]. Bulgur. Whole-grain and low-sodium cereals. Pita bread. Low-fat, low-sodium crackers. Whole-wheat flour tortillas. Vegetables  Fresh or frozen vegetables (raw, steamed, roasted, or grilled). Low-sodium or reduced-sodium tomato and vegetable juice. Low-sodium or reduced-sodium tomato sauce and tomato paste. Low-sodium or reduced-sodium canned vegetables. Fruits  All  fresh, dried, or frozen fruit. Canned fruit in natural juice (without added sugar). Meat and other protein foods  Skinless chicken or [place]. Ground chicken or [place]. Pork with fat trimmed off. Fish and seafood. Egg whites. Dried beans, peas, or lentils. Unsalted nuts, nut butters, and seeds. Unsalted canned beans. Lean cuts of beef with fat trimmed off. Low-sodium, lean deli meat. Dairy  Low-fat (1%) or fat-free (skim) milk. Fat-free, low-fat, or reduced-fat cheeses. Nonfat, low-sodium ricotta or cottage cheese. Low-fat or nonfat yogurt. Low-fat, low-sodium cheese. Fats and oils  Soft margarine without trans fats. Vegetable oil. Low-fat, reduced-fat, or light mayonnaise and salad dressings (reduced-sodium). Canola, safflower, olive, soybean, and sunflower oils. Avocado. Seasoning and other foods  Herbs. Spices. Seasoning mixes without salt. Unsalted popcorn and pretzels. Fat-free sweets. What foods are not recommended? The items listed may not be a complete list. Talk with your dietitian about what dietary choices are best for you. Grains  Baked goods made with fat, such as croissants, muffins, or some breads. Dry pasta or rice meal packs. Vegetables  Creamed or fried vegetables. Vegetables in a cheese sauce. Regular canned vegetables (not low-sodium or reduced-sodium). Regular canned tomato sauce and paste (not low-sodium or reduced-sodium). Regular tomato and vegetable juice (not low-sodium or reduced-sodium). Angie Fava. Olives. Fruits  Canned fruit in a light or heavy syrup. Fried fruit. Fruit in cream or butter sauce. Meat and other protein foods  Fatty cuts of meat. Ribs. Fried meat. Berniece Salines. Sausage. Bologna and other processed lunch meats. Salami. Fatback. Hotdogs. Bratwurst. Salted nuts and seeds. Canned beans with added salt. Canned or smoked fish. Whole eggs or egg yolks. Chicken or [place] with skin. Dairy  Whole or 2% milk, cream, and half-and-half. Whole or full-fat cream cheese.  Whole-fat or sweetened yogurt. Full-fat cheese. Nondairy creamers. Whipped toppings. Processed cheese and cheese spreads. Fats and oils  Butter. Stick margarine. Lard. Shortening. Ghee. Bacon fat. Tropical oils, such as coconut, palm kernel, or palm oil. Seasoning and other foods  Salted popcorn and pretzels. Onion salt, garlic salt, seasoned salt, table salt, and sea salt. Worcestershire sauce. Tartar sauce. Barbecue sauce. Teriyaki sauce. Soy sauce, including reduced-sodium. Steak sauce. Canned and packaged gravies. Fish sauce. Oyster sauce. Cocktail sauce. Horseradish that you find on the shelf. Ketchup. Mustard. Meat flavorings and tenderizers. Bouillon cubes. Hot sauce and Tabasco sauce. Premade or packaged marinades. Premade or packaged taco seasonings. Relishes. Regular salad dressings. Where to find more information:  National Heart, Lung, and [place]: https://wilson-eaton.com/  American Heart Association: www.heart.org Summary  The DASH eating plan is a healthy eating plan that has been shown to reduce high blood pressure (hypertension). It may also reduce your risk for type 2 diabetes, heart disease, and stroke.  With the DASH eating plan, you should limit salt (sodium) intake to 2,300 mg a day. If you have hypertension, you may need to reduce your sodium intake to 1,500 mg a day.  When on the DASH eating plan, aim to eat more fresh fruits and vegetables, whole  grains, lean proteins, low-fat dairy, and heart-healthy fats.  Work with your health care provider or diet and nutrition specialist (dietitian) to adjust your eating plan to your individual calorie needs. This information is not intended to replace advice given to you by your health care provider. Make sure you discuss any questions you have with your health care provider. Document Released: 12/04/2011 Document Revised: 12/08/2016 Document Reviewed: 12/08/2016 Elsevier Interactive Patient Education  2017 [employer].

## 2017-04-02 NOTE — Progress Notes (Signed)
Subjective:    Patient ID: [name], male    DOB: 27-Mar-1966, 51 y.o.   MRN: [id number]  Hypertension  This is a chronic ([name] presents for a follow up of hypertension. ) problem. The current episode started more than 1 year ago. Progression since onset: He has been taking medication consistently and does not check blood pressure at home.  The problem is controlled. Pertinent negatives include no anxiety, blurred vision, headaches, malaise/fatigue, neck pain, orthopnea, palpitations, peripheral edema, PND, shortness of breath or sweats. There are no associated agents to hypertension. Risk factors for coronary artery disease include obesity and sedentary lifestyle. Past treatments include calcium channel blockers. There are no compliance problems.  There is no history of angina, kidney disease, CAD/MI, CVA, heart failure, left ventricular hypertrophy, PVD or retinopathy. There is no history of chronic renal disease, coarctation of the aorta, hyperaldosteronism, hypercortisolism, hyperparathyroidism, a hypertension causing med, pheochromocytoma, renovascular disease, sleep apnea or a thyroid problem.    Past Medical History:  Diagnosis Date  . Hypertension    Social History   Social History  . Marital status: Married    Spouse name: N/A  . Number of children: N/A  . Years of education: N/A   Occupational History  . Not on file.   Social History Main Topics  . Smoking status: Former Research scientist (life sciences)  . Smokeless tobacco: Never Used  . Alcohol use No  . Drug use: No  . Sexual activity: Yes   Other Topics Concern  . Not on file   Social History Narrative  . No narrative on file   No Known Allergies Immunization History  Administered Date(s) Administered  . Tdap 01/02/2017   Review of Systems  Constitutional: Negative.  Negative for malaise/fatigue.  HENT: Negative.   Eyes: Negative.  Negative for blurred vision.  Respiratory: Negative.  Negative for shortness of breath.    Cardiovascular: Negative.  Negative for palpitations, orthopnea and PND.  Gastrointestinal: Negative.   Endocrine: Negative.   Genitourinary: Negative.   Musculoskeletal: Negative.  Negative for neck pain.  Skin: Negative.   Neurological: Negative.  Negative for headaches.  Psychiatric/Behavioral: Negative.        Objective:   Physical Exam  Constitutional: He is oriented to person, place, and time. He appears well-developed.  HENT:  Head: Normocephalic and atraumatic.  Right Ear: External ear normal.  Left Ear: External ear normal.  Nose: Nose normal.  Mouth/Throat: Oropharynx is clear and moist.  Cerumen impaction bilaterally  Eyes: Conjunctivae and EOM are normal. Pupils are equal, round, and reactive to light.  Neck: Normal range of motion. Neck supple.  Cardiovascular: Normal rate, regular rhythm, normal heart sounds and intact distal pulses.   Pulmonary/Chest: Effort normal and breath sounds normal.  Abdominal: Soft. Bowel sounds are normal.  Musculoskeletal:       Right elbow: He exhibits decreased range of motion.  Neurological: He is alert and oriented to person, place, and time. He has normal reflexes.  Skin: Skin is warm and dry.  Psychiatric: He has a normal mood and affect. His behavior is normal. Judgment and thought content normal.      BP (!) 142/85 (BP Location: Right Arm, Patient Position: Sitting, Cuff Size: Large)   Pulse 73   Temp 97.7 F (36.5 C) (Oral)   Resp 16   Ht 5\' 10"  (1.778 m)   Wt 263 lb (119.3 kg)   SpO2 98%   BMI 37.74 kg/m  Assessment & Plan:  1. Essential hypertension Blood pressure is above goal on current medication regimen. The patient is asked to make an attempt to improve diet and exercise patterns to aid in medical management of this problem.Recommend a lowfat, low carbohydrate diet divided over 5-6 small meals, increase water intake to 6-8 glasses, and 150 minutes per week of cardiovascular exercise.  - amLODipine (NORVASC)  10 MG tablet; Take 1 tablet (10 mg total) by mouth daily.  Dispense: 90 tablet; Refill: 1   Preventative maintenance:  Prostate exam recommended in 6 months   RTC: 6 months for hypertension   Hollis,Lachina M, FNP

## 2017-06-20 ENCOUNTER — Encounter (HOSPITAL_COMMUNITY): Payer: Self-pay | Admitting: *Deleted

## 2017-06-20 ENCOUNTER — Emergency Department (HOSPITAL_COMMUNITY)
Admission: EM | Admit: 2017-06-20 | Discharge: 2017-06-20 | Disposition: A | Discharge status: Home / Self Care | Payer: Self-pay | Attending: Emergency Medicine | Admitting: Emergency Medicine

## 2017-06-20 ENCOUNTER — Emergency Department (HOSPITAL_COMMUNITY): Payer: Self-pay

## 2017-06-20 DIAGNOSIS — M25461 Effusion, right knee: Secondary | ICD-10-CM | POA: Insufficient documentation

## 2017-06-20 DIAGNOSIS — I10 Essential (primary) hypertension: Secondary | ICD-10-CM | POA: Insufficient documentation

## 2017-06-20 DIAGNOSIS — Z87891 Personal history of nicotine dependence: Secondary | ICD-10-CM | POA: Insufficient documentation

## 2017-06-20 MED ORDER — DICLOFENAC SODIUM 50 MG PO TBEC: 50.0000 mg | DELAYED_RELEASE_TABLET | Freq: Two times a day (BID) | ORAL | 0 refills | Status: DC

## 2017-06-20 NOTE — Discharge Instructions (Signed)
Stop the over the counter medications you are taking for pain and use the medication we give you. Call Dr. Debroah Loop office on Monday for follow up. Return here sooner for worsening symptoms.

## 2017-06-20 NOTE — ED Triage Notes (Signed)
The pt is c/o rt knee pain with swelling for 2 weeks  No known injury

## 2017-06-20 NOTE — ED Provider Notes (Signed)
[name] DEPT Provider Note   CSN: [id number] Arrival date & time: 06/20/17  0086  By signing my name below, I, Jeanell Sparrow, attest that this documentation has been prepared under the direction and in the presence of non-physician practitioner, Debroah Baller, NP. Electronically Signed: Jeanell Sparrow, Scribe. 06/20/2017. 9:27 PM.  History   Chief Complaint Chief Complaint  Patient presents with  . Knee Pain   The history is provided by the patient. No language interpreter was used.  Knee Pain   This is a new problem. The current episode started more than 1 week ago. The problem occurs constantly. The problem has not changed since onset.The pain is present in the right knee. The pain is moderate. Treatments tried: brace.   HPI Comments: [name] is a 51 y.o. male with a PMHx of HTN who presents to the Emergency Department complaining of constant moderate right knee pain that started about 2 weeks ago. No known injury. He used a brace and Aleve PTA. His pain is exacerbated by movement and wearing knee brace (after initial relief). He reports that the knee brace was to small and cause a scratch on his leg.  He has gradually worsening right knee swelling. Denies any other complaints at this time.  Past Medical History:  Diagnosis Date  . Hypertension     Patient Active Problem List   Diagnosis Date Noted  . Prediabetes 01/02/2017  . Essential hypertension 09/04/2016    Past Surgical History:  Procedure Laterality Date  . CHOLECYSTECTOMY       Home Medications    Prior to Admission medications   Medication Sig Start Date End Date Taking? Authorizing Provider  amLODipine (NORVASC) 10 MG tablet Take 1 tablet (10 mg total) by mouth daily. 04/02/17   Dorena Dew, FNP  diclofenac (VOLTAREN) 50 MG EC tablet Take 1 tablet (50 mg total) by mouth 2 (two) times daily. 06/20/17   Ashley Murrain, NP    Family History Family History  Problem Relation Age of Onset  . Cancer  Father        prostate   . Cancer Maternal Grandfather        prostate    Social History Social History  Substance Use Topics  . Smoking status: Former Research scientist (life sciences)  . Smokeless tobacco: Never Used  . Alcohol use No     Allergies   Patient has no known allergies.   Review of Systems Review of Systems  Constitutional: Negative for fever.  Musculoskeletal: Positive for joint swelling and myalgias (right knee).     Physical Exam Updated Vital Signs BP (!) 153/86 (BP Location: Right Arm)   Pulse 86   Temp 97.9 F (36.6 C) (Oral)   Resp 18   Ht 5\' 10"  (1.778 m)   Wt 250 lb (113.4 kg)   SpO2 96%   BMI 35.87 kg/m   Physical Exam  Constitutional: He appears well-developed and well-nourished. No distress.  HENT:  Head: Normocephalic and atraumatic.  Eyes: Conjunctivae are normal.  Neck: Neck supple.  Cardiovascular: Normal rate.   Pedal pulses are 2+  Pulmonary/Chest: Effort normal.  Musculoskeletal:       Right knee: He exhibits decreased range of motion, swelling and effusion. He exhibits no deformity, no erythema and normal patellar mobility. Tenderness (with flexion) found.  RLE: Healing abrasion to the anterior right lower leg (from pressure of knee brace). Swelling of the right knee. Pain with flexion of the knee. Pedal pulses 2+.  Neurological: He  is alert.  Skin: Skin is warm and dry.  Psychiatric: He has a normal mood and affect. His behavior is normal.  Nursing note and vitals reviewed.    ED Treatments / Results  DIAGNOSTIC STUDIES: Oxygen Saturation is 96% on RA, normal by my interpretation.    COORDINATION OF CARE: 9:31 PM- Pt advised of plan for treatment and pt agrees.  Labs (all labs ordered are listed, but only abnormal results are displayed) Labs Reviewed - No data to display   Radiology Dg Knee Complete 4 Views Right  Result Date: 06/20/2017 CLINICAL DATA:  Right knee pain for 2 weeks, no known injury, some swelling. EXAM: RIGHT KNEE -  COMPLETE 4+ VIEW COMPARISON:  None. FINDINGS: Osseous alignment appears normal. No acute or suspicious osseous finding. No significant degenerative change. Joint effusion noted in the suprapatellar bursa. IMPRESSION: 1. Joint effusion within the suprapatellar bursa. 2. No acute or significant osseous abnormality. Electronically Signed   By: Franki Cabot M.D.   On: 06/20/2017 20:48    Procedures Procedures (including critical care time)  Medications Ordered in ED Medications - No data to display   Initial Impression / Assessment and Plan / ED Course  I have reviewed the triage vital signs and the nursing notes. Patient X-Ray negative for obvious fracture or dislocation. There is a joint effusion. Pain managed in ED. Pt advised to follow up with orthopedics. Knee immobilizer applied while in ED, conservative therapy recommended and discussed. Patient will be dc home & is agreeable with above plan. Patient remains neurovascularly intact prior to d/c.   Final Clinical Impressions(s) / ED Diagnoses   Final diagnoses:  Knee effusion, right    New Prescriptions New Prescriptions   DICLOFENAC (VOLTAREN) 50 MG EC TABLET    Take 1 tablet (50 mg total) by mouth 2 (two) times daily.  I personally performed the services described in this documentation, which was scribed in my presence. The recorded information has been reviewed and is accurate.    Debroah Baller [place], [place] 06/20/17 2155    Duffy Bruce, MD 06/21/17 [phone]

## 2017-06-29 ENCOUNTER — Ambulatory Visit: Payer: Self-pay | Admitting: Family Medicine

## 2017-07-04 ENCOUNTER — Ambulatory Visit (HOSPITAL_COMMUNITY)
Admission: EM | Admit: 2017-07-04 | Discharge: 2017-07-04 | Disposition: A | Discharge status: Home / Self Care | Payer: Self-pay | Attending: Family Medicine | Admitting: Family Medicine

## 2017-07-04 ENCOUNTER — Encounter (HOSPITAL_COMMUNITY): Payer: Self-pay | Admitting: *Deleted

## 2017-07-04 DIAGNOSIS — M25562 Pain in left knee: Secondary | ICD-10-CM

## 2017-07-04 MED ORDER — DICLOFENAC SODIUM 50 MG PO TBEC
50.0000 mg | DELAYED_RELEASE_TABLET | Freq: Two times a day (BID) | ORAL | 0 refills | Status: DC
Start: 2017-07-04 — End: 2017-11-27

## 2017-07-04 NOTE — ED Triage Notes (Signed)
Pt  Reports  Pain r  Knee       X   Several    Weeks   Denies   Any  specefic  Injury   Pt ambulated to  Exam room with  Slow  Steady  Gait

## 2017-07-04 NOTE — ED Provider Notes (Signed)
  [place]   [id number] 07/04/17 Arrival Time: 1221  ASSESSMENT & PLAN:  Today you were diagnosed with the following: 1. Acute pain of left knee    You have been prescribed prescription medications this visit.   Meds ordered this encounter  Medications  . diclofenac (VOLTAREN) 50 MG EC tablet    Sig: Take 1 tablet (50 mg total) by mouth 2 (two) times daily.    Dispense:  15 tablet    Refill:  0   Please take all medications as directed. I would recommend follow up with an orthopaedist to further evaluate why you are having recurrent knee pain and swelling.  If you are not improving over the next few days or feel you are worsening please follow up here or the Emergency Department if you are unable to see your regular doctor.   Reviewed expectations re: course of current medical issues. Questions answered. Outlined signs and symptoms indicating need for more acute intervention. Patient verbalized understanding. After Visit Summary given.  SUBJECTIVE:  [name] is a 51 y.o. male who presents with complaint of L knee pain. Seen in the ED recently and given NSAID which helped significantly. Now return of pain and swelling. No changes from last episode. Ambulatory. Afebrile. No skin changes reported. No OTC treatment.  ROS: As per HPI.   OBJECTIVE:  Vitals:   07/04/17 1305  BP: 130/70  Pulse: 78  Resp: 18  Temp: 98.6 F (37 C)  TempSrc: Oral  SpO2: 100%    General appearance: alert; no distress Extremities: R knee with some generalized swelling and poorly localized tenderness; FROM but reports discomfort with extension; no erythema MSK: symmetrical with no gross deformities Skin: warm and dry Neurologic: normal gait  No Known Allergies  PMHx, SurgHx, SocialHx, Medications, and Allergies were reviewed in the Visit Navigator and updated as appropriate.      Vanessa Kick, MD 07/04/17 1329

## 2017-07-04 NOTE — Discharge Instructions (Signed)
Today you were diagnosed with the following: 1. Acute pain of left knee    You have been prescribed prescription medications this visit.   Meds ordered this encounter  Medications   diclofenac (VOLTAREN) 50 MG EC tablet    Sig: Take 1 tablet (50 mg total) by mouth 2 (two) times daily.    Dispense:  15 tablet    Refill:  0   Please take all medications as directed. I would recommend follow up with an orthopaedist to further evaluate why you are having recurrent knee pain and swelling.  If you are not improving over the next few days or feel you are worsening please follow up here or the Emergency Department if you are unable to see your regular doctor.

## 2017-07-15 MED FILL — AMLODIPINE BESYLATE 10 MG T: 10 | 90 days supply | Qty: 90 | Fill #1

## 2017-09-12 ENCOUNTER — Emergency Department (HOSPITAL_COMMUNITY)
Admission: EM | Admit: 2017-09-12 | Discharge: 2017-09-12 | Disposition: A | Discharge status: Home / Self Care | Payer: 59 | Attending: Emergency Medicine | Admitting: Emergency Medicine

## 2017-09-12 ENCOUNTER — Encounter (HOSPITAL_COMMUNITY): Payer: Self-pay | Admitting: *Deleted

## 2017-09-12 DIAGNOSIS — Z9049 Acquired absence of other specified parts of digestive tract: Secondary | ICD-10-CM | POA: Diagnosis not present

## 2017-09-12 DIAGNOSIS — R7303 Prediabetes: Secondary | ICD-10-CM | POA: Diagnosis not present

## 2017-09-12 DIAGNOSIS — M25561 Pain in right knee: Secondary | ICD-10-CM | POA: Insufficient documentation

## 2017-09-12 DIAGNOSIS — G8929 Other chronic pain: Secondary | ICD-10-CM

## 2017-09-12 DIAGNOSIS — Z87891 Personal history of nicotine dependence: Secondary | ICD-10-CM | POA: Diagnosis not present

## 2017-09-12 DIAGNOSIS — I10 Essential (primary) hypertension: Secondary | ICD-10-CM | POA: Insufficient documentation

## 2017-09-12 DIAGNOSIS — Z79899 Other long term (current) drug therapy: Secondary | ICD-10-CM | POA: Diagnosis not present

## 2017-09-12 MED ORDER — LIDOCAINE HCL (PF) 1 % IJ SOLN
INTRAMUSCULAR | Status: AC
  Filled 2017-09-12: qty 30

## 2017-09-12 NOTE — ED Notes (Signed)
Dr Lenna Sciara in room. Pt's right knee does not look to be significantly larger than the left. Dr Lenna Sciara given lidocaine and syringes to aspirate fluid from knee. No fluid in knee to aspirate. Band aid in place over incision.

## 2017-09-12 NOTE — ED Triage Notes (Signed)
To ED for eval of right knee pain and swelling. States this has become worse over the past couple of weeks. No injury. Hx of same and fluid removed years ago. Ambulatory with a limp

## 2017-09-12 NOTE — ED Provider Notes (Signed)
[facility] DEPT Provider Note   CSN: [id number] Arrival date & time: 09/12/17  1436     History   Chief Complaint Chief Complaint  Patient presents with  . Knee Pain    HPI [name] is a 51 y.o. male.Complains of right knee pain and intermittent swelling of right knee for the past 3 months. No trauma no fever no nausea or vomiting. Treated with Aleve with partial relief. Pain is worse with weightbearing improve with nonweightbearing. He feels as a fluid in his right knee. He had fluid drained off of his right knee 12th 14 years ago  HPI  Past Medical History:  Diagnosis Date  . Hypertension     Patient Active Problem List   Diagnosis Date Noted  . Prediabetes 01/02/2017  . Essential hypertension 09/04/2016    Past Surgical History:  Procedure Laterality Date  . CHOLECYSTECTOMY         Home Medications    Prior to Admission medications   Medication Sig Start Date End Date Taking? Authorizing Provider  amLODipine (NORVASC) 10 MG tablet Take 1 tablet (10 mg total) by mouth daily. 04/02/17   Dorena Dew, FNP  diclofenac (VOLTAREN) 50 MG EC tablet Take 1 tablet (50 mg total) by mouth 2 (two) times daily. 07/04/17   Vanessa Kick, MD    Family History Family History  Problem Relation Age of Onset  . Cancer Father        prostate   . Cancer Maternal Grandfather        prostate    Social History Social History  Substance Use Topics  . Smoking status: Former Research scientist (life sciences)  . Smokeless tobacco: Never Used  . Alcohol use No   No illicit drug use  Allergies   Patient has no known allergies.   Review of Systems Review of Systems  Constitutional: Negative.   Gastrointestinal: Negative.   Musculoskeletal: Positive for arthralgias.       Right knee pain  Skin: Negative.      Physical Exam Updated Vital Signs BP (!) 164/79 (BP Location: Left Arm)   Pulse 70   Temp 98.7 F (37.1 C) (Oral)   Resp 17   SpO2 96%   Physical Exam  Constitutional:  He appears well-developed and well-nourished. No distress.  HENT:  Head: Normocephalic and atraumatic.  Eyes: EOM are normal.  Neck: Normal range of motion. Neck supple.  Pulmonary/Chest: Effort normal.  Abdominal: He exhibits no distension.  Musculoskeletal:  Right lower extremity no redness no swelling he is mildly tender at anterior knee. No definite effusion. DP pulse 2+. Good capillary refill. All other extremities without redness swelling or tenderness neurovascularly intact  Nursing note and vitals reviewed.    ED Treatments / Results  Labs (all labs ordered are listed, but only abnormal results are displayed) Labs Reviewed - No data to display  EKG  EKG Interpretation None       Radiology No results found.  Procedures .Joint Aspiration/Arthrocentesis Date/Time: 09/12/2017 4:33 PM Performed by: Orlie Dakin Authorized by: Orlie Dakin   Consent:    Consent obtained:  Verbal   Consent given by:  Patient   Risks discussed:  Infection, pain and incomplete drainage   Alternatives discussed:  No treatment and observation Location:    Location:  Knee   Knee:  R knee Anesthesia (see MAR for exact dosages):    Anesthesia method:  Local infiltration   Local anesthetic:  Lidocaine 1% w/o epi Procedure details:  Preparation: Patient was prepped and draped in usual sterile fashion     Needle gauge:  18 G   Ultrasound guidance: no     Approach:  Medial   Aspirate amount:  0   Steroid injected: no     Specimen collected: no   Post-procedure details:    Dressing:  Adhesive bandage   Patient tolerance of procedure:  Tolerated well, no immediate complications   (including critical care time)  Medications Ordered in ED Medications  lidocaine (PF) (XYLOCAINE) 1 % injection (not administered)     Initial Impression / Assessment and Plan / ED Course  I have reviewed the triage vital signs and the nursing notes.  Pertinent labs & imaging results that were  available during my care of the patient were reviewed by me and considered in my medical decision making (see chart for details).     Attempted arthrocentesis of right knee, no fluid return. Plan Tylenol for pain. Orthopedic referral.  Final Clinical Impressions(s) / ED Diagnoses  #1Right knee pain Final diagnoses:  None  #2Elevated blood pressure  New Prescriptions New Prescriptions   No medications on file     Orlie Dakin, MD 09/12/17 1651

## 2017-09-12 NOTE — Discharge Instructions (Signed)
Take Tylenol as directed for pain. Keep your scheduled appointment with your primary care physician next month. If you like you can call Dr.Swintek to schedule an office appointment regarding your painful right knee. Take your blood pressure medicine when you get home today.

## 2017-10-02 ENCOUNTER — Ambulatory Visit: Payer: Self-pay | Admitting: Family Medicine

## 2017-10-05 ENCOUNTER — Ambulatory Visit: Payer: Self-pay | Admitting: Family Medicine

## 2017-10-09 ENCOUNTER — Ambulatory Visit (INDEPENDENT_AMBULATORY_CARE_PROVIDER_SITE_OTHER): Payer: 59 | Admitting: Family Medicine

## 2017-10-09 ENCOUNTER — Encounter: Payer: Self-pay | Admitting: Family Medicine

## 2017-10-09 VITALS — BP 139/89 | HR 79 | Temp 98.2°F | Resp 16 | Ht 70.0 in | Wt 249.0 lb

## 2017-10-09 DIAGNOSIS — M25561 Pain in right knee: Secondary | ICD-10-CM | POA: Diagnosis not present

## 2017-10-09 DIAGNOSIS — Z23 Encounter for immunization: Secondary | ICD-10-CM | POA: Diagnosis not present

## 2017-10-09 DIAGNOSIS — G8929 Other chronic pain: Secondary | ICD-10-CM | POA: Diagnosis not present

## 2017-10-09 DIAGNOSIS — M25461 Effusion, right knee: Secondary | ICD-10-CM

## 2017-10-09 DIAGNOSIS — I10 Essential (primary) hypertension: Secondary | ICD-10-CM | POA: Diagnosis not present

## 2017-10-09 LAB — BASIC METABOLIC PANEL WITH GFR
BUN: 18 mg/dL (ref 7–25)
CO2: 28 mmol/L (ref 20–32)
CREATININE: 0.82 mg/dL (ref 0.70–1.33)
Calcium: 9.9 mg/dL (ref 8.6–10.3)
Chloride: 99 mmol/L (ref 98–110)
GFR, EST AFRICAN AMERICAN: 119 mL/min/{1.73_m2} (ref >=60)
GFR, Est Non African American: 102 mL/min/{1.73_m2} (ref >=60)
GLUCOSE: 87 mg/dL (ref 65–99)
Potassium: 4.7 mmol/L (ref 3.5–5.3)
Sodium: 133 mmol/L — ABNORMAL LOW (ref 135–146)

## 2017-10-09 MED ORDER — KETOROLAC TROMETHAMINE 60 MG/2ML IM SOLN
60.0000 mg | Freq: Once | INTRAMUSCULAR | Status: AC
  Administered 2017-10-09: 60 mg via INTRAMUSCULAR

## 2017-10-09 MED ORDER — MELOXICAM 7.5 MG PO TABS: 7.5000 mg | ORAL_TABLET | Freq: Every day | ORAL | 0 refills | Status: DC

## 2017-10-09 MED ORDER — AMLODIPINE BESYLATE 10 MG PO TABS: 10.0000 mg | ORAL_TABLET | Freq: Every day | ORAL | 1 refills | Status: DC

## 2017-10-09 NOTE — Progress Notes (Signed)
Subjective:    [name] is a 51 y.o. male with a history of hypertension presents complaining of right knee pain and swelling over the past several months. He has been evaluated in the emegency department for this issue. He had a right  knee xray on 06/20/2017, which showed joint effusion within the suprapatellar bursa. He denies a previous injury. He currently rates pain intensity at 7/10. He has been taking extra strength Tylenol periodically without relief. He describes pain as aching and intermittent. Pain is worsened by going up and down stairs, pivoting, and prolonged standing.  Past Medical History:  Diagnosis Date  . Hypertension    Social History   Social History  . Marital status: Married    Spouse name: N/A  . Number of children: N/A  . Years of education: N/A   Occupational History  . Not on file.   Social History Main Topics  . Smoking status: Former Research scientist (life sciences)  . Smokeless tobacco: Never Used  . Alcohol use No  . Drug use: No  . Sexual activity: Yes   Other Topics Concern  . Not on file   Social History Narrative  . No narrative on file   Immunization History  Administered Date(s) Administered  . Influenza,inj,Quad PF,6+ Mos 10/09/2017  . Tdap 01/02/2017    Review of Systems Review of Systems  Constitutional: Negative.   HENT: Negative.   Eyes: Negative.   Respiratory: Negative.   Cardiovascular: Negative.  Negative for chest pain and palpitations.  Genitourinary: Negative.   Musculoskeletal: Positive for joint pain (right knee).  Skin: Negative.   Neurological: Negative.   Endo/Heme/Allergies: Negative.   Psychiatric/Behavioral: Negative.     Objective:     Physical Exam  Constitutional: He is oriented to person, place, and time.  HENT:  Head: Normocephalic and atraumatic.  Right Ear: External ear normal.  Left Ear: External ear normal.  Nose: Nose normal.  Mouth/Throat: Oropharynx is clear and moist.  Eyes: Pupils are equal, round, and  reactive to light.  Cardiovascular: Normal rate, regular rhythm, normal heart sounds and intact distal pulses.   Pulmonary/Chest: Effort normal and breath sounds normal.  Abdominal: Soft. Bowel sounds are normal.  Musculoskeletal:       Right knee: He exhibits decreased range of motion and swelling (pre patella).  Neurological: He is alert and oriented to person, place, and time. Gait normal.  Skin: Skin is warm and dry.  Psychiatric: Mood, memory, affect and judgment normal.    X-ray right knee: no fracture, dislocation, swelling or degenerative changes noted and joint effusion of prepatellar bursa    Assessment:  BP 139/89 (BP Location: Right Arm, Patient Position: Sitting, Cuff Size: Normal)   Pulse 79   Temp 98.2 F (36.8 C) (Oral)   Resp 16   Ht 5\' 10"  (1.778 m)   Wt 249 lb (112.9 kg)   SpO2 96%   BMI 35.73 kg/m   Plan:  1. Chronic pain of right knee Will start a trial of Meloxicam 7.5 mg daily with food.  Elevate knee while at rest Apply warm, moist compresses to knee Will send a referral to orthopedic specialist for further workup and evaluation.  May benefit from steroid injection and/or PT - AMB referral to orthopedics - ketorolac (TORADOL) injection 60 mg; Inject 2 mLs (60 mg total) into the muscle once. - meloxicam (MOBIC) 7.5 MG tablet; Take 1 tablet (7.5 mg total) by mouth daily.  Dispense: 30 tablet; Refill: 0 -   2. Swelling  of right knee joint - ketorolac (TORADOL) injection 60 mg; Inject 2 mLs (60 mg total) into the muscle once. - meloxicam (MOBIC) 7.5 MG tablet; Take 1 tablet (7.5 mg total) by mouth daily.  Dispense: 30 tablet; Refill: 0  3. Essential hypertension Blood pressure is at goal on current medication regimen, no medication changes warranted.   Will review renal functioning as results become available  - amLODipine (NORVASC) 10 MG tablet; Take 1 tablet (10 mg total) by mouth daily.  Dispense: 90 tablet; Refill: 1  4. Influenza vaccination  given - Flu Vaccine QUAD 6+ mos PF IM (Fluarix Quad PF)      RTC: 6 months for hypertension   Donia Pounds  MSN, FNP-C Patient [place] [address] [place],  [id number] [phone]

## 2017-10-09 NOTE — Patient Instructions (Addendum)
You have received Toradol 60 mg IM without complication for right knee pain Start Meloxicam 7.5 mg daily with good  Elevate right knee to heart level while at rest Apply warm, moist compresses 20 minutes 4 times per day as neded.   I have sent a referral to orthopedic physician    Knee Pain, Adult Many things can cause knee pain. The pain often goes away on its own with time and rest. If the pain does not go away, tests may be done to find out what is causing the pain. Follow these instructions at home: Activity  Rest your knee.  Do not do things that cause pain.  Avoid activities where both feet leave the ground at the same time (high-impact activities). Examples are running, jumping rope, and doing jumping jacks. General instructions  Take medicines only as told by your doctor.  Raise (elevate) your knee when you are resting. Make sure your knee is higher than your heart.  Sleep with a pillow under your knee.  If told, put ice on the knee: ? Put ice in a plastic bag. ? Place a towel between your skin and the bag. ? Leave the ice on for 20 minutes, 2-3 times a day.  Ask your doctor if you should wear an elastic knee support.  Lose weight if you are overweight. Being overweight can make your knee hurt more.  Do not use any tobacco products. These include cigarettes, chewing tobacco, or electronic cigarettes. If you need help quitting, ask your doctor. Smoking may slow down healing. Contact a doctor if:  The pain does not stop.  The pain changes or gets worse.  You have a fever along with knee pain.  Your knee gives out or locks up.  Your knee swells, and becomes worse. Get help right away if:  Your knee feels warm.  You cannot move your knee.  You have very bad knee pain.  You have chest pain.  You have trouble breathing. Summary  Many things can cause knee pain. The pain often goes away on its own with time and rest.  Avoid activities that put stress on  your knee. These include running and jumping rope.  Get help right away if you cannot move your knee, or if your knee feels warm, or if you have trouble breathing. This information is not intended to replace advice given to you by your health care provider. Make sure you discuss any questions you have with your health care provider. Document Released: 03/13/2009 Document Revised: 12/09/2016 Document Reviewed: 12/09/2016 Elsevier Interactive Patient Education  2017 [employer].

## 2017-10-11 DIAGNOSIS — M25561 Pain in right knee: Principal | ICD-10-CM

## 2017-10-11 DIAGNOSIS — G8929 Other chronic pain: Secondary | ICD-10-CM | POA: Insufficient documentation

## 2017-10-11 LAB — POCT URINALYSIS DIP (DEVICE)
Bilirubin Urine: NEGATIVE
Glucose, UA: NEGATIVE mg/dL
HGB URINE DIPSTICK: NEGATIVE
Ketones, ur: NEGATIVE mg/dL
Leukocytes, UA: NEGATIVE
NITRITE: NEGATIVE
PH: 5.5 (ref 5.0–8.0)
PROTEIN: NEGATIVE mg/dL
Specific Gravity, Urine: 1.025 (ref 1.005–1.030)
Urobilinogen, UA: 0.2 mg/dL (ref 0.0–1.0)

## 2017-10-16 ENCOUNTER — Ambulatory Visit (INDEPENDENT_AMBULATORY_CARE_PROVIDER_SITE_OTHER): Payer: 59 | Admitting: Family

## 2017-10-16 DIAGNOSIS — M1711 Unilateral primary osteoarthritis, right knee: Secondary | ICD-10-CM | POA: Diagnosis not present

## 2017-10-16 MED ORDER — METHYLPREDNISOLONE ACETATE 40 MG/ML IJ SUSP
40.0000 mg | INTRAMUSCULAR | Status: AC | PRN
  Administered 2017-10-16: 40 mg via INTRA_ARTICULAR

## 2017-10-16 MED ORDER — LIDOCAINE HCL 1 % IJ SOLN
5.0000 mL | INTRAMUSCULAR | Status: AC | PRN
  Administered 2017-10-16: 5 mL

## 2017-10-16 NOTE — Progress Notes (Signed)
Office Visit Note   Patient: [name]           Date of Birth: [birth year]           MRN: [id number] Visit Date: 10/16/2017              Requested by: Dorena Dew, FNP [address], [place] [address] PCP: Dorena Dew, FNP  No chief complaint on file.     HPI: The patient is a 51 year old gentleman who presents today complaining of right knee pain, this has been worsening since June of this year. No associated injury. Complains of start up stiffness, aching pain primarily along medial joint line. Pain in bed at night. No mechanical symptoms. Intermittent swelling, no erythema or warmth.   Previous radiographs reviewed with medial joint space narrowing.   Assessment & Plan: Visit Diagnoses:  1. Primary osteoarthritis of right knee     Plan: Depomedrol injection. Follow up in office as needed. 4 weeks if no improvement.   Follow-Up Instructions: No Follow-up on file.   Right Knee Exam   Tenderness  The patient is experiencing tenderness in the medial joint line.  Range of Motion  The patient has normal right knee ROM.  Muscle Strength   The patient has normal right knee strength.  Tests  Varus: negative Valgus: negative  Other  Erythema: absent Swelling: mild Other tests: no effusion present      Patient is alert, oriented, no adenopathy, well-dressed, normal affect, normal respiratory effort.   Imaging: No results found. No images are attached to the encounter.  Labs: Lab Results  Component Value Date   HGBA1C 5.7 01/02/2017   HGBA1C 5.7 (H) 09/04/2016   HGBA1C  02/26/2010    6.0 (NOTE) The ADA recommends the following therapeutic goal for glycemic control related to Hgb A1c measurement: Goal of therapy: <6.5 Hgb A1c  Reference: American Diabetes Association: Clinical Practice Recommendations 2010, Diabetes Care, 2010, 33: (Suppl  1).   ESRSEDRATE 18 01/02/2017   CRP 1.0 01/02/2017    Orders:  No orders of the  defined types were placed in this encounter.  No orders of the defined types were placed in this encounter.    Procedures: Large Joint Inj Date/Time: 10/16/2017 2:09 PM Performed by: Dondra Prader R Authorized by: Dondra Prader R   Consent Given by:  Patient Site marked: the procedure site was marked   Timeout: prior to procedure the correct patient, procedure, and site was verified   Indications:  Pain and diagnostic evaluation Location:  Knee Site:  R knee Needle Size:  22 G Needle Length:  1.5 inches Ultrasound Guidance: No   Fluoroscopic Guidance: No   Arthrogram: No   Medications:  5 mL lidocaine 1 %; 40 mg methylPREDNISolone acetate 40 MG/ML Aspiration Attempted: No   Patient tolerance:  Patient tolerated the procedure well with no immediate complications    Clinical Data: No additional findings.  ROS:  All other systems negative, except as noted in the HPI. Review of Systems  Constitutional: Negative for chills and fever.  Musculoskeletal: Positive for arthralgias and joint swelling.    Objective: Vital Signs: There were no vitals taken for this visit.  Specialty Comments:  No specialty comments available.  PMFS History: Patient Active Problem List   Diagnosis Date Noted  . Chronic pain of right knee 10/11/2017  . Prediabetes 01/02/2017  . Essential hypertension 09/04/2016   Past Medical History:  Diagnosis Date  .  Hypertension     Family History  Problem Relation Age of Onset  . Cancer Father        prostate   . Cancer Maternal Grandfather        prostate    Past Surgical History:  Procedure Laterality Date  . CHOLECYSTECTOMY     Social History   Occupational History  . Not on file.   Social History Main Topics  . Smoking status: Former Research scientist (life sciences)  . Smokeless tobacco: Never Used  . Alcohol use No  . Drug use: No  . Sexual activity: Yes

## 2017-11-02 ENCOUNTER — Other Ambulatory Visit: Payer: Self-pay | Admitting: Family Medicine

## 2017-11-02 DIAGNOSIS — I10 Essential (primary) hypertension: Secondary | ICD-10-CM

## 2017-11-02 MED FILL — AMLODIPINE BESYLATE 10 MG T: 10 | 90 days supply | Qty: 90 | Fill #0

## 2017-11-27 ENCOUNTER — Ambulatory Visit (HOSPITAL_COMMUNITY)
Admission: EM | Admit: 2017-11-27 | Discharge: 2017-11-27 | Disposition: A | Discharge status: Home / Self Care | Payer: 59 | Attending: Emergency Medicine | Admitting: Emergency Medicine

## 2017-11-27 ENCOUNTER — Encounter (HOSPITAL_COMMUNITY): Payer: Self-pay | Admitting: Family Medicine

## 2017-11-27 DIAGNOSIS — G8929 Other chronic pain: Secondary | ICD-10-CM | POA: Diagnosis not present

## 2017-11-27 DIAGNOSIS — M25561 Pain in right knee: Secondary | ICD-10-CM

## 2017-11-27 DIAGNOSIS — M25461 Effusion, right knee: Secondary | ICD-10-CM

## 2017-11-27 MED ORDER — MELOXICAM 7.5 MG PO TABS: 7.5000 mg | ORAL_TABLET | Freq: Every day | ORAL | 0 refills | Status: AC

## 2017-11-27 NOTE — ED Provider Notes (Signed)
[facility]    CSN: [id number] Arrival date & time: 11/27/17  1723     History   Chief Complaint Chief Complaint  Patient presents with  . Knee Pain    HPI [name] is a 51 y.o. male presenting with chronic right knee pain. He has had osetoarthrtis of his right knee for a while. His pain increased significantly this week on Monday. Today has been the worst. He has take Tylenol without relief. He received a steroid injection on 10/13/17 with [facility] and was given meloxicam. This combo provided him with significant relief. He states he is out of meloxicam. Pain worsens with weight bearing activities and walking. No recent injury to cause the increased pain.  HPI  Past Medical History:  Diagnosis Date  . Hypertension     Patient Active Problem List   Diagnosis Date Noted  . Chronic pain of right knee 10/11/2017  . Prediabetes 01/02/2017  . Essential hypertension 09/04/2016    Past Surgical History:  Procedure Laterality Date  . CHOLECYSTECTOMY         Home Medications    Prior to Admission medications   Medication Sig Start Date End Date Taking? Authorizing Provider  amLODipine (NORVASC) 10 MG tablet Take 1 tablet (10 mg total) by mouth daily. 10/09/17   Dorena Dew, FNP  amLODipine (NORVASC) 10 MG tablet TAKE 1 TABLET BY MOUTH DAILY. 11/02/17   Dorena Dew, FNP  meloxicam (MOBIC) 7.5 MG tablet Take 1 tablet (7.5 mg total) by mouth daily for 14 days. 11/27/17 12/11/17  Azaya Goedde, Elesa Hacker, PA-C    Family History Family History  Problem Relation Age of Onset  . Cancer Father        prostate   . Cancer Maternal Grandfather        prostate    Social History Social History   Tobacco Use  . Smoking status: Former Research scientist (life sciences)  . Smokeless tobacco: Never Used  Substance Use Topics  . Alcohol use: No  . Drug use: No     Allergies   Patient has no known allergies.   Review of Systems Review of Systems  Constitutional:  Negative for fatigue and fever.  Musculoskeletal: Positive for arthralgias and joint swelling.  Skin: Negative for color change and rash.     Physical Exam Triage Vital Signs ED Triage Vitals  Enc Vitals Group     BP 11/27/17 1746 (!) 172/79     Pulse Rate 11/27/17 1746 83     Resp 11/27/17 1746 18     Temp 11/27/17 1746 98.6 F (37 C)     Temp src --      SpO2 11/27/17 1746 95 %     Weight --      Height --      Head Circumference --      Peak Flow --      Pain Score 11/27/17 1745 9     Pain Loc --      Pain Edu? --      Excl. in [place]? --    No data found.  Updated Vital Signs BP (!) 172/79   Pulse 83   Temp 98.6 F (37 C)   Resp 18   SpO2 95%    Physical Exam  Constitutional: He is oriented to person, place, and time. He appears well-developed and well-nourished.  Overweight male in no acute distress  Musculoskeletal:  Right knee: mild joint swelling, no erythema. Pain felt along medial  joint line. ROM 0-115 on right, 0-130 on left. Negative Mcmurrays. No varus or valgus stress. Dorsalis pedis 2 +, Cap refill < 2  Neurological: He is alert and oriented to person, place, and time.  Skin: Skin is warm and dry. No rash noted. No erythema.     UC Treatments / Results  Labs (all labs ordered are listed, but only abnormal results are displayed) Labs Reviewed - No data to display  EKG  EKG Interpretation None       Radiology No results found.  Procedures Procedures (including critical care time)  Medications Ordered in UC Medications - No data to display   Initial Impression / Assessment and Plan / UC Course  I have reviewed the triage vital signs and the nursing notes.  Pertinent labs & imaging results that were available during my care of the patient were reviewed by me and considered in my medical decision making (see chart for details).     Patient experiencing worsening pain from osteoarthritis of right knee. Xray on 10/13/17 reveals medial  joint narrowing. No imaging obtained today. Refilled meloxicam for 2 weeks. Also advised alternating ice and heat every 20-30 minutes. Advised to follow up with ortho clinic if pain persists or worsens. Too soon for another steroid injection.  Final Clinical Impressions(s) / UC Diagnoses   Final diagnoses:  Chronic pain of right knee    ED Discharge Orders        Ordered    meloxicam (MOBIC) 7.5 MG tablet  Daily     11/27/17 1759       Controlled Substance Prescriptions [place] Controlled Substance Registry consulted? Not Applicable   Janith Lima, [place] 11/27/17 1811

## 2017-11-27 NOTE — ED Triage Notes (Signed)
Pt here for chronic right knee pain. Reports that he has a cortisone shot 2 months ago and this week the pain returned severe.

## 2017-11-27 NOTE — Discharge Instructions (Signed)
Meloxicam refilled. Take 1 tablet once daily. 2 week supply given. Please take with food.  May also try ice and heat.   Follow up with orthopedics if your symptoms continue.

## 2018-02-08 ENCOUNTER — Encounter (HOSPITAL_COMMUNITY): Payer: Self-pay | Admitting: Emergency Medicine

## 2018-02-08 ENCOUNTER — Ambulatory Visit (HOSPITAL_COMMUNITY)
Admission: EM | Admit: 2018-02-08 | Discharge: 2018-02-08 | Disposition: A | Discharge status: Home / Self Care | Payer: 59 | Attending: Urgent Care | Admitting: Urgent Care

## 2018-02-08 DIAGNOSIS — M25561 Pain in right knee: Secondary | ICD-10-CM

## 2018-02-08 DIAGNOSIS — G8929 Other chronic pain: Secondary | ICD-10-CM | POA: Diagnosis not present

## 2018-02-08 DIAGNOSIS — M1711 Unilateral primary osteoarthritis, right knee: Secondary | ICD-10-CM

## 2018-02-08 MED ORDER — MELOXICAM 7.5 MG PO TABS: 7.5000 mg | ORAL_TABLET | Freq: Every day | ORAL | 5 refills | Status: DC

## 2018-02-08 NOTE — Discharge Instructions (Addendum)
You may take 500mg  Tylenol every 6 hours for pain and inflammation associated with your knee arthritis. You can take meloxicam with this.

## 2018-02-08 NOTE — ED Triage Notes (Signed)
PT reports arthritis pain in right knee for over 1 year. PT saw ortho in November and had an injection.

## 2018-02-08 NOTE — ED Provider Notes (Signed)
  MRN: [id number] DOB: 09/28/1966  Subjective:   Amdrew Lucas is a 52 y.o. male presenting for 1 year history of persistent right knee pain. Has a history of arthritis, managed with his orthopedist. Last OV was 10/2017, received a steroid injection to his knee then with significant relief. His work is very demanding and strenuous, works on Ambulance person. Denies fever, trauma, falls, redness, swelling, history of gout. Has used meloxicam with good results in the past.  No current facility-administered medications for this encounter.   Current Outpatient Medications:  .  amLODipine (NORVASC) 10 MG tablet, Take 1 tablet (10 mg total) by mouth daily., Disp: 90 tablet, Rfl: 1 .  amLODipine (NORVASC) 10 MG tablet, TAKE 1 TABLET BY MOUTH DAILY., Disp: 90 tablet, Rfl: 1   [name] has No Known Allergies.  Trino  has a past medical history of Hypertension. Also  has a past surgical history that includes Cholecystectomy.  Objective:   Vitals: BP (!) 149/87 (BP Location: Left Arm) Comment: Notified Tina G  Pulse 83   Temp 99.3 F (37.4 C) (Oral)   Resp 18   Wt 240 lb (108.9 kg)   SpO2 95%   BMI 34.44 kg/m   BP Readings from Last 3 Encounters:  02/08/18 (!) 149/87  11/27/17 (!) 172/79  10/09/17 139/89    Physical Exam  Constitutional: He is oriented to person, place, and time. He appears well-developed and well-nourished.  Cardiovascular: Normal rate.  Pulmonary/Chest: Effort normal.  Musculoskeletal:       Right knee: He exhibits swelling (trace). He exhibits normal range of motion, no ecchymosis, no deformity, no laceration, no erythema and normal patellar mobility. Tenderness found. Medial joint line tenderness noted. No lateral joint line and no patellar tendon tenderness noted.  Neurological: He is alert and oriented to person, place, and time.   Assessment and Plan :   Chronic pain of right knee  Arthritis of right knee  Will manage conservatively with meloxicam. Patient is to set up  follow up appt with his orthopedist to see if steroid injections are appropriate as his right knee x-ray from 05/2017 did not show arthritis. Patient has done well with meloxicam. Return-to-clinic precautions discussed, patient verbalized understanding.    Jaynee Eagles, PA-C 02/08/18 2008

## 2018-02-11 MED FILL — AMLODIPINE BESYLATE 10 MG T: 10 | 30 days supply | Qty: 30 | Fill #0

## 2018-03-17 MED FILL — AMLODIPINE BESYLATE 10 MG T: 10 | 30 days supply | Qty: 30 | Fill #1

## 2018-04-09 ENCOUNTER — Ambulatory Visit (INDEPENDENT_AMBULATORY_CARE_PROVIDER_SITE_OTHER): Payer: 59 | Admitting: Family Medicine

## 2018-04-09 VITALS — BP 126/80 | HR 76 | Temp 98.3°F | Resp 14 | Ht 70.0 in | Wt 249.0 lb

## 2018-04-09 DIAGNOSIS — G8929 Other chronic pain: Secondary | ICD-10-CM | POA: Diagnosis not present

## 2018-04-09 DIAGNOSIS — I10 Essential (primary) hypertension: Secondary | ICD-10-CM

## 2018-04-09 DIAGNOSIS — Z1211 Encounter for screening for malignant neoplasm of colon: Secondary | ICD-10-CM

## 2018-04-09 DIAGNOSIS — R7303 Prediabetes: Secondary | ICD-10-CM | POA: Diagnosis not present

## 2018-04-09 DIAGNOSIS — M25561 Pain in right knee: Secondary | ICD-10-CM

## 2018-04-09 LAB — POCT URINALYSIS DIPSTICK
BILIRUBIN UA: NEGATIVE
Blood, UA: NEGATIVE
Glucose, UA: NEGATIVE
Ketones, UA: NEGATIVE
LEUKOCYTES UA: NEGATIVE
NITRITE UA: NEGATIVE
PH UA: 7 (ref 5.0–8.0)
PROTEIN UA: NEGATIVE
Spec Grav, UA: 1.02 (ref 1.010–1.025)
UROBILINOGEN UA: 1 U/dL

## 2018-04-09 LAB — POCT GLYCOSYLATED HEMOGLOBIN (HGB A1C): Hemoglobin A1C: 5.6

## 2018-04-09 MED ORDER — AMLODIPINE BESYLATE 10 MG PO TABS: 10.0000 mg | ORAL_TABLET | Freq: Every day | ORAL | 1 refills | Status: DC

## 2018-04-09 NOTE — Progress Notes (Signed)
Subjective:    [name] is a 52 y.o. male with a history of hypertension and chronic right knee pain presents for follow up.  Patient says that he has been taking all prescribed medications consistently. He does not follow a lowfat, low sodium diet or exercises routinely. Mr. Mineer denies shortness of breath, chest pains, heart palpitations, lower extremity edema, or fatigue.  Patient  complaining of right knee pain and swelling over the past several months.  He had a right  knee xray on 06/20/2017, which showed joint effusion within the suprapatellar bursa. He denies a previous injury. He currently rates pain intensity at 7/10. He has been taking extra strength Tylenol periodically without relief. He describes pain as aching and intermittent. Pain is worsened by going up and down stairs, pivoting, and prolonged standing.  Past Medical History:  Diagnosis Date  . Hypertension    Social History   Socioeconomic History  . Marital status: Married    Spouse name: Not on file  . Number of children: Not on file  . Years of education: Not on file  . Highest education level: Not on file  Occupational History  . Not on file  Social Needs  . Financial resource strain: Not on file  . Food insecurity:    Worry: Not on file    Inability: Not on file  . Transportation needs:    Medical: Not on file    Non-medical: Not on file  Tobacco Use  . Smoking status: Former Research scientist (life sciences)  . Smokeless tobacco: Never Used  Substance and Sexual Activity  . Alcohol use: No  . Drug use: No  . Sexual activity: Yes  Lifestyle  . Physical activity:    Days per week: Not on file    Minutes per session: Not on file  . Stress: Not on file  Relationships  . Social connections:    Talks on phone: Not on file    Gets together: Not on file    Attends religious service: Not on file    Active member of club or organization: Not on file    Attends meetings of clubs or organizations: Not on file    Relationship  status: Not on file  . Intimate partner violence:    Fear of current or ex partner: Not on file    Emotionally abused: Not on file    Physically abused: Not on file    Forced sexual activity: Not on file  Other Topics Concern  . Not on file  Social History Narrative  . Not on file   Immunization History  Administered Date(s) Administered  . Influenza,inj,Quad PF,6+ Mos 10/09/2017  . Tdap 01/02/2017    Review of Systems Review of Systems  Constitutional: Negative.   HENT: Negative.   Eyes: Negative.   Respiratory: Negative.   Cardiovascular: Negative.  Negative for chest pain, palpitations and leg swelling.  Genitourinary: Negative.   Musculoskeletal: Positive for joint pain (right knee).  Skin: Negative.   Neurological: Negative.  Negative for dizziness and headaches.  Endo/Heme/Allergies: Negative.   Psychiatric/Behavioral: Negative.     Objective:     Physical Exam  Constitutional: He is oriented to person, place, and time.  HENT:  Head: Normocephalic and atraumatic.  Right Ear: External ear normal.  Left Ear: External ear normal.  Nose: Nose normal.  Mouth/Throat: Oropharynx is clear and moist.  Eyes: Pupils are equal, round, and reactive to light.  Cardiovascular: Normal rate, regular rhythm, normal heart sounds and intact distal  pulses.  Pulmonary/Chest: Effort normal and breath sounds normal.  Abdominal: Soft. Bowel sounds are normal.  Musculoskeletal:       Right knee: He exhibits decreased range of motion and swelling (pre patella).  Neurological: He is alert and oriented to person, place, and time. Gait normal.  Skin: Skin is warm and dry.  Psychiatric: Mood, memory, affect and judgment normal.    X-ray right knee: no fracture, dislocation, swelling or degenerative changes noted and joint effusion of prepatellar bursa    Assessment:  BP 126/80 (BP Location: Right Arm, Patient Position: Sitting, Cuff Size: Large) Comment: manually  Pulse 76   Temp 98.3  F (36.8 C) (Oral)   Resp 14   Ht 5\' 10"  (1.778 m)   Wt 249 lb (112.9 kg)   SpO2 98%   BMI 35.73 kg/m   Plan:  1. Essential hypertension Blood pressure is at goal on current medication regimen. No medication changes warranted on today.  - Continue medication, monitor blood pressure at home. Continue DASH diet. Reminded to go to the ER if any CP, SOB, nausea, dizziness, severe HA, changes vision/speech, left arm numbness and tingling and jaw pain.    - Urinalysis Dipstick - amLODipine (NORVASC) 10 MG tablet; Take 1 tablet (10 mg total) by mouth daily.  Dispense: 90 tablet; Refill: 1 - Basic Metabolic Panel  2. Prediabetes - HgB A1c  3. Chronic pain of right knee Elevate knee while at rest Apply warm, moist compresses to knee- AMB referral to orthopedics  4. Colon cancer screening - Ambulatory referral to Gastroenterology  RTC: 6 months for hypertension  Donia Pounds  MSN, FNP-C [facility] [address] [place], [place] [address] [phone]

## 2018-04-09 NOTE — Patient Instructions (Signed)
DASH Eating Plan DASH stands for "Dietary Approaches to Stop Hypertension." The DASH eating plan is a healthy eating plan that has been shown to reduce high blood pressure (hypertension). It may also reduce your risk for type 2 diabetes, heart disease, and stroke. The DASH eating plan may also help with weight loss. What are tips for following this plan? General guidelines  Avoid eating more than 2,300 mg (milligrams) of salt (sodium) a day. If you have hypertension, you may need to reduce your sodium intake to 1,500 mg a day.  Limit alcohol intake to no more than 1 drink a day for nonpregnant women and 2 drinks a day for men. One drink equals 12 oz of beer, 5 oz of wine, or 1 oz of hard liquor.  Work with your health care provider to maintain a healthy body weight or to lose weight. Ask what an ideal weight is for you.  Get at least 30 minutes of exercise that causes your heart to beat faster (aerobic exercise) most days of the week. Activities may include walking, swimming, or biking.  Work with your health care provider or diet and nutrition specialist (dietitian) to adjust your eating plan to your individual calorie needs. Reading food labels  Check food labels for the amount of sodium per serving. Choose foods with less than 5 percent of the Daily Value of sodium. Generally, foods with less than 300 mg of sodium per serving fit into this eating plan.  To find whole grains, look for the word "whole" as the first word in the ingredient list. Shopping  Buy products labeled as "low-sodium" or "no salt added."  Buy fresh foods. Avoid canned foods and premade or frozen meals. Cooking  Avoid adding salt when cooking. Use salt-free seasonings or herbs instead of table salt or sea salt. Check with your health care provider or pharmacist before using salt substitutes.  Do not fry foods. Cook foods using healthy methods such as baking, boiling, grilling, and broiling instead.  Cook with  heart-healthy oils, such as olive, canola, soybean, or sunflower oil. Meal planning   Eat a balanced diet that includes: ? 5 or more servings of fruits and vegetables each day. At each meal, try to fill half of your plate with fruits and vegetables. ? Up to 6-8 servings of whole grains each day. ? Less than 6 oz of lean meat, poultry, or fish each day. A 3-oz serving of meat is about the same size as a deck of cards. One egg equals 1 oz. ? 2 servings of low-fat dairy each day. ? A serving of nuts, seeds, or beans 5 times each week. ? Heart-healthy fats. Healthy fats called Omega-3 fatty acids are found in foods such as flaxseeds and coldwater fish, like sardines, salmon, and mackerel.  Limit how much you eat of the following: ? Canned or prepackaged foods. ? Food that is high in trans fat, such as fried foods. ? Food that is high in saturated fat, such as fatty meat. ? Sweets, desserts, sugary drinks, and other foods with added sugar. ? Full-fat dairy products.  Do not salt foods before eating.  Try to eat at least 2 vegetarian meals each week.  Eat more home-cooked food and less restaurant, buffet, and fast food.  When eating at a restaurant, ask that your food be prepared with less salt or no salt, if possible. What foods are recommended? The items listed may not be a complete list. Talk with your dietitian about what   dietary choices are best for you. Grains Whole-grain or whole-wheat bread. Whole-grain or whole-wheat pasta. Brown rice. Oatmeal. Quinoa. Bulgur. Whole-grain and low-sodium cereals. Pita bread. Low-fat, low-sodium crackers. Whole-wheat flour tortillas. Vegetables Fresh or frozen vegetables (raw, steamed, roasted, or grilled). Low-sodium or reduced-sodium tomato and vegetable juice. Low-sodium or reduced-sodium tomato sauce and tomato paste. Low-sodium or reduced-sodium canned vegetables. Fruits All fresh, dried, or frozen fruit. Canned fruit in natural juice (without  added sugar). Meat and other protein foods Skinless chicken or turkey. Ground chicken or turkey. Pork with fat trimmed off. Fish and seafood. Egg whites. Dried beans, peas, or lentils. Unsalted nuts, nut butters, and seeds. Unsalted canned beans. Lean cuts of beef with fat trimmed off. Low-sodium, lean deli meat. Dairy Low-fat (1%) or fat-free (skim) milk. Fat-free, low-fat, or reduced-fat cheeses. Nonfat, low-sodium ricotta or cottage cheese. Low-fat or nonfat yogurt. Low-fat, low-sodium cheese. Fats and oils Soft margarine without trans fats. Vegetable oil. Low-fat, reduced-fat, or light mayonnaise and salad dressings (reduced-sodium). Canola, safflower, olive, soybean, and sunflower oils. Avocado. Seasoning and other foods Herbs. Spices. Seasoning mixes without salt. Unsalted popcorn and pretzels. Fat-free sweets. What foods are not recommended? The items listed may not be a complete list. Talk with your dietitian about what dietary choices are best for you. Grains Baked goods made with fat, such as croissants, muffins, or some breads. Dry pasta or rice meal packs. Vegetables Creamed or fried vegetables. Vegetables in a cheese sauce. Regular canned vegetables (not low-sodium or reduced-sodium). Regular canned tomato sauce and paste (not low-sodium or reduced-sodium). Regular tomato and vegetable juice (not low-sodium or reduced-sodium). Pickles. Olives. Fruits Canned fruit in a light or heavy syrup. Fried fruit. Fruit in cream or butter sauce. Meat and other protein foods Fatty cuts of meat. Ribs. Fried meat. Bacon. Sausage. Bologna and other processed lunch meats. Salami. Fatback. Hotdogs. Bratwurst. Salted nuts and seeds. Canned beans with added salt. Canned or smoked fish. Whole eggs or egg yolks. Chicken or turkey with skin. Dairy Whole or 2% milk, cream, and half-and-half. Whole or full-fat cream cheese. Whole-fat or sweetened yogurt. Full-fat cheese. Nondairy creamers. Whipped toppings.  Processed cheese and cheese spreads. Fats and oils Butter. Stick margarine. Lard. Shortening. Ghee. Bacon fat. Tropical oils, such as coconut, palm kernel, or palm oil. Seasoning and other foods Salted popcorn and pretzels. Onion salt, garlic salt, seasoned salt, table salt, and sea salt. Worcestershire sauce. Tartar sauce. Barbecue sauce. Teriyaki sauce. Soy sauce, including reduced-sodium. Steak sauce. Canned and packaged gravies. Fish sauce. Oyster sauce. Cocktail sauce. Horseradish that you find on the shelf. Ketchup. Mustard. Meat flavorings and tenderizers. Bouillon cubes. Hot sauce and Tabasco sauce. Premade or packaged marinades. Premade or packaged taco seasonings. Relishes. Regular salad dressings. Where to find more information:  National Heart, Lung, and Blood Institute: www.nhlbi.nih.gov  American Heart Association: www.heart.org Summary  The DASH eating plan is a healthy eating plan that has been shown to reduce high blood pressure (hypertension). It may also reduce your risk for type 2 diabetes, heart disease, and stroke.  With the DASH eating plan, you should limit salt (sodium) intake to 2,300 mg a day. If you have hypertension, you may need to reduce your sodium intake to 1,500 mg a day.  When on the DASH eating plan, aim to eat more fresh fruits and vegetables, whole grains, lean proteins, low-fat dairy, and heart-healthy fats.  Work with your health care provider or diet and nutrition specialist (dietitian) to adjust your eating plan to your individual   calorie needs. This information is not intended to replace advice given to you by your health care provider. Make sure you discuss any questions you have with your health care provider. Document Released: 12/04/2011 Document Revised: 12/08/2016 Document Reviewed: 12/08/2016 Elsevier Interactive Patient Education  2018 Elsevier Inc.  

## 2018-04-10 LAB — BASIC METABOLIC PANEL
BUN/Creatinine Ratio: 21 — ABNORMAL HIGH (ref 9–20)
BUN: 17 mg/dL (ref 6–24)
CHLORIDE: 99 mmol/L (ref 96–106)
CO2: 23 mmol/L (ref 20–29)
Calcium: 9.7 mg/dL (ref 8.7–10.2)
Creatinine, Ser: 0.81 mg/dL (ref 0.76–1.27)
GFR calc Af Amer: 118 mL/min/{1.73_m2} (ref >59)
GFR, EST NON AFRICAN AMERICAN: 102 mL/min/{1.73_m2} (ref >59)
GLUCOSE: 83 mg/dL (ref 65–99)
POTASSIUM: 4 mmol/L (ref 3.5–5.2)
SODIUM: 136 mmol/L (ref 134–144)

## 2018-04-13 ENCOUNTER — Telehealth: Payer: Self-pay

## 2018-04-13 NOTE — Telephone Encounter (Signed)
-----   Message from Dorena Dew, [place] sent at 04/10/2018  6:41 AM EDT ----- Regarding: lab results Please inform patient that all labs are within a normal range. Follow up in 6 months as scheduled or as needed.   Donia Pounds  MSN, FNP-C [facility] [address] [place], [place] [address] [phone]

## 2018-04-13 NOTE — Telephone Encounter (Signed)
Called, no answer., Left a message advising that all labs were normal and no changes were needed at this time. Advised to follow up in 6 months or as needed. Thanks!

## 2018-04-15 ENCOUNTER — Encounter: Payer: Self-pay | Admitting: Family Medicine

## 2018-04-22 MED FILL — AMLODIPINE BESYLATE 10 MG T: 10 | 30 days supply | Qty: 30 | Fill #0

## 2018-05-02 ENCOUNTER — Other Ambulatory Visit: Payer: Self-pay

## 2018-05-02 ENCOUNTER — Ambulatory Visit (HOSPITAL_COMMUNITY)
Admission: EM | Admit: 2018-05-02 | Discharge: 2018-05-02 | Disposition: A | Discharge status: Home / Self Care | Payer: 59 | Attending: Family Medicine | Admitting: Family Medicine

## 2018-05-02 ENCOUNTER — Encounter (HOSPITAL_COMMUNITY): Payer: Self-pay | Admitting: Emergency Medicine

## 2018-05-02 DIAGNOSIS — L03031 Cellulitis of right toe: Secondary | ICD-10-CM

## 2018-05-02 MED ORDER — SULFAMETHOXAZOLE-TRIMETHOPRIM 800-160 MG PO TABS: 1.0000 | ORAL_TABLET | Freq: Two times a day (BID) | ORAL | 0 refills | Status: AC

## 2018-05-02 NOTE — Discharge Instructions (Signed)
Soak for 20 min a day Take the antibiotic Return as needed May need podiatry if persists

## 2018-05-02 NOTE — ED Triage Notes (Signed)
Right great toe is oozing pus and bloody drainage from toenail of right great toe.  Patient says it has been draining for a week.  Denies any injury.

## 2018-05-02 NOTE — ED Provider Notes (Signed)
[facility]    CSN: [id number] Arrival date & time: 05/02/18  1601   History   Chief Complaint Chief Complaint  Patient presents with  . Toe Pain    HPI [name] is a 52 y.o. male.   HPI  Patient states he has "an ingrown toenail". It has been progressively getting worse for about 2 weeks. He denies any trauma. He states that he has to wear a boot at work, and he is on his feet a lot.  He feels like his boots fit well.  No recent change in activity.  No diabetes.  Well-controlled hypertension.  Usually in good health.  No prior foot problems.  Past Medical History:  Diagnosis Date  . Hypertension     Patient Active Problem List   Diagnosis Date Noted  . Chronic pain of right knee 10/11/2017  . Prediabetes 01/02/2017  . Essential hypertension 09/04/2016    Past Surgical History:  Procedure Laterality Date  . CHOLECYSTECTOMY         Home Medications    Prior to Admission medications   Medication Sig Start Date End Date Taking? Authorizing Provider  amLODipine (NORVASC) 10 MG tablet Take 1 tablet (10 mg total) by mouth daily. 04/09/18   Dorena Dew, FNP  meloxicam (MOBIC) 7.5 MG tablet Take 1-2 tablets (7.5-15 mg total) by mouth daily. 02/08/18   Jaynee Eagles, PA-C  sulfamethoxazole-trimethoprim (BACTRIM DS,SEPTRA DS) 800-160 MG tablet Take 1 tablet by mouth 2 (two) times daily for 7 days. 05/02/18 05/09/18  Raylene Everts, MD    Family History Family History  Problem Relation Age of Onset  . Cancer Father        prostate   . Cancer Maternal Grandfather        prostate    Social History Social History   Tobacco Use  . Smoking status: Former Research scientist (life sciences)  . Smokeless tobacco: Never Used  Substance Use Topics  . Alcohol use: No  . Drug use: No     Allergies   Patient has no known allergies.   Review of Systems Review of Systems  Constitutional: Negative for chills and fever.  HENT: Negative for ear pain and sore throat.   Eyes:  Negative for pain and visual disturbance.  Respiratory: Negative for cough and shortness of breath.   Cardiovascular: Negative for chest pain and palpitations.  Gastrointestinal: Negative for abdominal pain and vomiting.  Genitourinary: Negative for dysuria and hematuria.  Musculoskeletal: Negative for arthralgias and back pain.  Skin: Positive for wound. Negative for color change and rash.  Neurological: Negative for seizures and syncope.  All other systems reviewed and are negative.    Physical Exam Triage Vital Signs ED Triage Vitals  Enc Vitals Group     BP 05/02/18 1645 (!) 145/81     Pulse Rate 05/02/18 1645 80     Resp 05/02/18 1645 20     Temp 05/02/18 1645 98.3 F (36.8 C)     Temp Source 05/02/18 1645 Oral     SpO2 05/02/18 1645 97 %     Weight --      Height --      Head Circumference --      Peak Flow --      Pain Score 05/02/18 1644 5     Pain Loc --      Pain Edu? --      Excl. in [place]? --    No data found.  Updated Vital Signs  BP (!) 145/81 (BP Location: Left Arm)   Pulse 80   Temp 98.3 F (36.8 C) (Oral)   Resp 20   SpO2 97%      Physical Exam  Constitutional: He appears well-developed and well-nourished.  HENT:  Head: Normocephalic and atraumatic.  Eyes: Conjunctivae are normal.  Neck: Neck supple.  Cardiovascular: Normal rate and regular rhythm.  No murmur heard. Pulmonary/Chest: Effort normal and breath sounds normal. No respiratory distress.  Musculoskeletal: He exhibits no edema.       Feet:  Patient has a pincer nail deformity on the right great toe.  The lateral aspect has a large heap of proud flesh approximately 1/2 x 1 cm across.  Friable and bleeding.  There is crusting and peeling of the skin around the edges.  No purulence expressed.  Lymphadenopathy:    He has no cervical adenopathy.  Neurological: He is alert.  Skin: Skin is warm and dry.  Psychiatric: He has a normal mood and affect.  Nursing note and vitals  reviewed.  Procedure  The foot was soaked for 20 minutes in warm soapy water.  I then sharply debrided away peeled and dead skin around the lateral aspect of the toe.  I applied silver nitrate to the granulation tissue. We discussed care, soaking, antibiotics.  He may need podiatry in follow-up to Surgically repair the pincer nail as it will likely continue to cause him problems. UC Treatments / Results    Initial Impression / Assessment and Plan / UC Course  I have reviewed the triage vital signs and the nursing notes.  Pertinent labs & imaging results that were available during my care of the patient were reviewed by me and considered in my medical decision making (see chart for details).    * Final Clinical Impressions(s) / UC Diagnoses   Final diagnoses:  Paronychia of great toe of right foot     Discharge Instructions     Soak for 20 min a day Take the antibiotic Return as needed May need podiatry if persists   ED Prescriptions    Medication Sig Dispense Auth. Provider   sulfamethoxazole-trimethoprim (BACTRIM DS,SEPTRA DS) 800-160 MG tablet Take 1 tablet by mouth 2 (two) times daily for 7 days. 14 tablet Raylene Everts, MD     Controlled Substance Prescriptions [place] Controlled Substance Registry consulted?  Not indicated  Raylene Everts, MD 05/02/18 2227

## 2018-05-07 ENCOUNTER — Ambulatory Visit (INDEPENDENT_AMBULATORY_CARE_PROVIDER_SITE_OTHER): Payer: Self-pay | Admitting: Orthopaedic Surgery

## 2018-05-25 MED FILL — AMLODIPINE BESYLATE 10 MG T: 10 | 30 days supply | Qty: 30 | Fill #1

## 2018-05-28 ENCOUNTER — Ambulatory Visit (INDEPENDENT_AMBULATORY_CARE_PROVIDER_SITE_OTHER): Payer: Self-pay | Admitting: Orthopaedic Surgery

## 2018-06-18 ENCOUNTER — Encounter: Payer: Self-pay | Admitting: Family Medicine

## 2018-06-18 ENCOUNTER — Ambulatory Visit (INDEPENDENT_AMBULATORY_CARE_PROVIDER_SITE_OTHER): Payer: 59 | Admitting: Family Medicine

## 2018-06-18 VITALS — BP 135/70 | HR 80 | Temp 98.6°F | Resp 16 | Ht 70.0 in | Wt 247.0 lb

## 2018-06-18 DIAGNOSIS — M79674 Pain in right toe(s): Secondary | ICD-10-CM

## 2018-06-18 DIAGNOSIS — S91101A Unspecified open wound of right great toe without damage to nail, initial encounter: Secondary | ICD-10-CM

## 2018-06-18 MED ORDER — KETOROLAC TROMETHAMINE 60 MG/2ML IM SOLN
60.0000 mg | Freq: Once | INTRAMUSCULAR | Status: AC
  Administered 2018-06-18: 60 mg via INTRAMUSCULAR

## 2018-06-18 MED ORDER — CEPHALEXIN 500 MG PO CAPS: 500.0000 mg | ORAL_CAPSULE | Freq: Three times a day (TID) | ORAL | 0 refills | Status: AC

## 2018-06-18 MED ORDER — ACETAMINOPHEN-CODEINE #3 300-30 MG PO TABS: 1.0000 | ORAL_TABLET | Freq: Four times a day (QID) | ORAL | 0 refills | Status: DC | PRN

## 2018-06-18 NOTE — Patient Instructions (Signed)
Wound care instructions: Cleaned with 0.9% over-the-counter saline Apply bacitracin ointment twice daily to clean skin. Cover affected toe with gauze while wearing steel toe boots We will send a referral to podiatry for further work-up and evaluation We will start a trial of Keflex 500 mg 3 times daily with food for 10 days. Follow-up in 1 week

## 2018-06-18 NOTE — Progress Notes (Signed)
   Subjective:    Patient ID: [name], male    DOB: Dec 19, 1966, 52 y.o.   MRN: [id number]  HPI [name], a 52 year old male with a history of hypertension presents complaining of right great toe wound and pain.  Patient states that wound is been present for greater than a month.  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  Patient states that he works in a hot environment and typically sweats.  Wound has been worsening over the last month and he endorses drainage.  Patient has been applying peroxide spray without relief.  Current pain intensity is 5/10 characterized as intermittent and throbbing.  Past Medical History:  Diagnosis Date  . Hypertension    Review of Systems  Constitutional: Negative.   HENT: Negative.   Eyes: Negative for photophobia and visual disturbance.  Respiratory: Negative.   Cardiovascular: Negative.   Gastrointestinal: Negative.   Endocrine: Negative.   Genitourinary: Negative.   Musculoskeletal: Negative.   Skin: Positive for wound (right great toe).  Allergic/Immunologic: Negative.   Neurological: Negative.   Hematological: Negative.   Psychiatric/Behavioral: Negative.        Objective:   Physical Exam  Constitutional: He is oriented to person, place, and time.  Cardiovascular: Normal rate, regular rhythm, normal heart sounds and intact distal pulses.  Pulmonary/Chest: Effort normal and breath sounds normal.  Abdominal: Soft. Bowel sounds are normal.  Musculoskeletal: Normal range of motion.  Neurological: He is alert and oriented to person, place, and time.  Skin: Skin is warm and dry.  Wound to right great toenail with minimal bloody drainage, hyperpigmentation to toenail with some yellowing, erythematous, edematous    BP 135/70 (BP Location: Left Arm, Patient Position: Sitting, Cuff Size: Large)   Pulse 80   Temp 98.6 F (37 C) (Oral)   Resp 16   Ht 5\' 10"  (1.778 m)   Wt 247 lb (112 kg)   SpO2 97%   BMI 35.44  kg/m     Assessment & Plan:  Open wound of right great toe, initial encounter - cephALEXin (KEFLEX) 500 MG capsule; Take 1 capsule (500 mg total) by mouth 3 (three) times daily for 10 days.  Dispense: 30 capsule; Refill: 0 - acetaminophen-codeine (TYLENOL #3) 300-30 MG tablet; Take 1 tablet by mouth every 6 (six) hours as needed for moderate pain.  Dispense: 15 tablet; Refill: 0 - Ambulatory referral to Podiatry  Great toe pain, right - ketorolac (TORADOL) injection 60 mg - Ambulatory referral to Podiatry   RTC: 1 week for right great toe pain Donia Pounds  MSN, FNP-C [facility] [address] [place], [place] [address] [phone]

## 2018-06-22 ENCOUNTER — Encounter: Payer: Self-pay | Admitting: Sports Medicine

## 2018-06-22 ENCOUNTER — Ambulatory Visit: Payer: 59 | Admitting: Sports Medicine

## 2018-06-22 DIAGNOSIS — M79674 Pain in right toe(s): Secondary | ICD-10-CM | POA: Diagnosis not present

## 2018-06-22 DIAGNOSIS — L03031 Cellulitis of right toe: Secondary | ICD-10-CM

## 2018-06-22 NOTE — Progress Notes (Signed)
Subjective: Alwin Lanigan is a 52 y.o. male patient presents to office today complaining of a moderately painful incurvated, red, hot, swollen medial>proximal nail border of the 1st toe on the right foot. This has been present for 1 month. Patient has treated this by peroxide and antibiotic cream. Patient is currently on Keflex as given by PCP. Patient denies fever/chills/nausea/vomitting/any other related constitutional symptoms at this time.  Review of Systems  Musculoskeletal:       Toe pain  All other systems reviewed and are negative.  Patient Active Problem List   Diagnosis Date Noted  . Chronic pain of right knee 10/11/2017  . Prediabetes 01/02/2017  . Essential hypertension 09/04/2016    Current Outpatient Medications on File Prior to Visit  Medication Sig Dispense Refill  . acetaminophen-codeine (TYLENOL #3) 300-30 MG tablet Take 1 tablet by mouth every 6 (six) hours as needed for moderate pain. 15 tablet 0  . amLODipine (NORVASC) 10 MG tablet Take 1 tablet (10 mg total) by mouth daily. 90 tablet 1  . cephALEXin (KEFLEX) 500 MG capsule Take 1 capsule (500 mg total) by mouth 3 (three) times daily for 10 days. 30 capsule 0  . meloxicam (MOBIC) 7.5 MG tablet Take 1-2 tablets (7.5-15 mg total) by mouth daily. 30 tablet 5   No current facility-administered medications on file prior to visit.     No Known Allergies  Objective:  There were no vitals filed for this visit.  General: Well developed, nourished, in no acute distress, alert and oriented x3   Dermatology: Skin is warm, dry and supple bilateral. Right hallux nail appears to be severely incurvated with hyperkeratosis formation at the distal aspects of  the medial>proximal nail border. (+) Erythema. (+) Edema. (+) serosanguous  drainage present. The remaining nails appear unremarkable at this time. There are no open sores, lesions or other signs of infection  present.  Vascular: Dorsalis Pedis artery and Posterior Tibial  artery pedal pulses are 2/4 bilateral with immedate capillary fill time. Pedal hair growth present. No lower extremity edema.   Neruologic: Grossly intact via light touch bilateral.  Musculoskeletal: Tenderness to palpation of the right hallux nail. Muscular strength within normal limits in all groups bilateral.   Assesement and Plan: Problem List Items Addressed This Visit    None    Visit Diagnoses    Paronychia of toe, right    -  Primary   Toe pain, right          -Discussed treatment alternatives and plan of care; Explained temporary nail avulsion and post procedure course to patient. - After a verbal and written consent, injected 3 ml of a 50:50 mixture of 2% plain  lidocaine and 0.5% plain marcaine in a normal hallux block fashion. Next, a  betadine prep was performed. Anesthesia was tested and found to be appropriate.  The offending right hallux nail was then incised from the hyponychium to the epinychium. The offending right hallux nail was removed and cleared from the field. The area was curretted for any remaining nail or spicules and the area was then flushed with alcohol and dressed with antibiotic cream and a dry sterile dressing. -Patient was instructed to leave the dressing intact for today and begin soaking  in a weak solution of betadine or Epsom salt and water tomorrow. Patient was instructed to soak for 15 minutes each day and apply neosporin and a gauze or bandaid dressing each day. -Patient was instructed to monitor the toe for signs of  infection and return to office if toe becomes red, hot or swollen. -Advised ice, elevation, and tylenol or motrin if needed for pain.  -May resume normal work on Monday; patient is on vacation this week -Patient is to return in 2-3 weeks for follow up care/nail check or sooner if problems arise.  Landis Martins, DPM

## 2018-06-22 NOTE — Patient Instructions (Signed)

## 2018-06-24 ENCOUNTER — Ambulatory Visit: Payer: 59 | Admitting: Family Medicine

## 2018-06-24 ENCOUNTER — Telehealth: Payer: Self-pay | Admitting: Sports Medicine

## 2018-06-24 MED FILL — AMLODIPINE BESYLATE 10 MG T: 10 | 30 days supply | Qty: 30 | Fill #2

## 2018-06-24 NOTE — Telephone Encounter (Signed)
Thanks

## 2018-06-24 NOTE — Telephone Encounter (Signed)
Patient has a blister on the toe where Dr Cannon Kettle removed the nail. Right foot big toe. A little pain. If you can call patient back at [phone]

## 2018-06-24 NOTE — Telephone Encounter (Signed)
I informed pt he should continue the antibiotic, and the soaking instructions given at his appt and to call if symptoms worsened increase redness, swelling, streaks from the toe, and drainage to call or go to the ED.

## 2018-06-25 ENCOUNTER — Encounter (INDEPENDENT_AMBULATORY_CARE_PROVIDER_SITE_OTHER): Payer: Self-pay | Admitting: Orthopaedic Surgery

## 2018-06-25 ENCOUNTER — Ambulatory Visit (INDEPENDENT_AMBULATORY_CARE_PROVIDER_SITE_OTHER): Payer: 59 | Admitting: Orthopaedic Surgery

## 2018-06-25 ENCOUNTER — Ambulatory Visit (INDEPENDENT_AMBULATORY_CARE_PROVIDER_SITE_OTHER): Payer: 59

## 2018-06-25 VITALS — BP 144/86 | HR 75

## 2018-06-25 DIAGNOSIS — M25561 Pain in right knee: Secondary | ICD-10-CM | POA: Diagnosis not present

## 2018-06-25 DIAGNOSIS — M1711 Unilateral primary osteoarthritis, right knee: Secondary | ICD-10-CM | POA: Diagnosis not present

## 2018-06-25 DIAGNOSIS — G8929 Other chronic pain: Secondary | ICD-10-CM | POA: Diagnosis not present

## 2018-06-25 NOTE — Progress Notes (Signed)
Office Visit Note   Patient: [name]           Date of Birth: Feb 21, 1966           MRN: [id number] Visit Date: 06/25/2018              Requested by: Dorena Dew, FNP [address], [place] [address] PCP: Dorena Dew, FNP   Assessment & Plan: Visit Diagnoses:  1. Chronic pain of right knee   2. Arthritis of right knee     Plan: Reviewed x-rays with patient today.  Advised him that with the significant progression of his degenerative changes and his ongoing pain with failed conservative treatment with injection, oral NSAID, activity modification and multiple visits to different physicians that best treatment option at this point would be surgical intervention with total knee replacement.  Literature was given for him to review.  Preop sheet filled out.  I did get blood work today to check CBC and arthritis panel to make sure that there is not something else contributing to his accelerated degenerative changes of his right knee.  Ongoing problems with his knee has caused a significant negative impact on his quality of life.  All questions answered.  I will see patient back in a couple weeks to review all of his labs.  Follow-Up Instructions: Return in about 3 weeks (around 07/16/2018) for with Dr. Lorin Mercy to review blood work and to discuss scheduling of right total knee replacement.   Orders:  Orders Placed This Encounter  Procedures  . XR KNEE 3 VIEW RIGHT  . CBC  . Rheumatoid Factor  . Uric acid  . Sed Rate (ESR)  . Antinuclear Antib (ANA)   No orders of the defined types were placed in this encounter.     Procedures: No procedures performed   Clinical Data: No additional findings.   Subjective: Chief Complaint  Patient presents with  . Right Knee - Pain    HPI 52 year old black male comes in today with complaints of right knee pain and swelling.  Since June 2018 patient has been seen by his primary care physician[facility] and also by nurse practitioner here in our office for right knee pain.  Pain has been progressively getting worse.  June 20, 2017 he had right knee x-ray and report showed osseous alignment appears normal.  No acute or suspicious osseous finding.  No significant degenerative change.  Joint effusion noted in the suprapatellar bursa.  He has had ongoing pain in his knee with ambulating, stairs, squatting, pivoting.  Is also had popping and swelling.  Nurse practitioner in our office performed intra-articular knee injection October 16, 2017 and he states that this gave some improvement for 1 to 2 weeks.  He has not had any other imaging studies since June 2018. No lumbar hip radicular component.  He is also tried conservative treatment with oral NSAIDs.  Patient works at a [employer] ([employer]) and does a lot of walking and heavy lifting and this aggravates his symptoms.  He denies known history of inflammatory arthropathy.  He does have intermittent pain in his left knee and this is been more noticeable over the last few months. Review of Systems No current cardiac pulmonary GI GU issues  Objective: Vital Signs: BP (!) 144/86   Pulse 75   Physical Exam  Constitutional: He is oriented to person, place, and time. No distress.  HENT:  Head: Normocephalic.  Eyes:  Pupils are equal, round, and reactive to light. EOM are normal.  Pulmonary/Chest: No respiratory distress.  Abdominal: He exhibits no distension.  Musculoskeletal:  Gait is somewhat antalgic.  Negative logroll bilateral hips.  Negative straight leg raise.  Right knee range of motion about 0 to 120 degrees.  Some swelling with small effusion.  Medial greater than lateral joint line tenderness.  Positive crepitus.  Ligaments are stable.  Calf nontender.  Neurovascular intact.  Neurological: He is alert and oriented to person, place, and time.  Skin: Skin is warm and dry.  Psychiatric: He has a normal mood and affect.   No current cardiac  pulmonary GI GU issues Ortho Exam  Specialty Comments:  No specialty comments available.  Imaging: Xr Knee 3 View Right  Result Date: 06/25/2018 X-ray right knee AP lateral and sunrise views obtained show moderate to severe degenerative changes.  Near bone-on-bone medial compartment.  Some periarticular spurs.  Medial compartment subchondral cystic changes.  Large loose body posterior knee.  Large posterior loose body was seen on previous films June 2018.  Today's x-rays have showed significant progression from previous films June 2018.  No acute findings.    PMFS History: Patient Active Problem List   Diagnosis Date Noted  . Chronic pain of right knee 10/11/2017  . Prediabetes 01/02/2017  . Essential hypertension 09/04/2016   Past Medical History:  Diagnosis Date  . Hypertension     Family History  Problem Relation Age of Onset  . Cancer Father        prostate   . Cancer Maternal Grandfather        prostate    Past Surgical History:  Procedure Laterality Date  . CHOLECYSTECTOMY     Social History   Occupational History  . Not on file  Tobacco Use  . Smoking status: Former Research scientist (life sciences)  . Smokeless tobacco: Never Used  Substance and Sexual Activity  . Alcohol use: No  . Drug use: No  . Sexual activity: Yes

## 2018-06-28 LAB — CBC
HEMATOCRIT: 44.9 % (ref 38.5–50.0)
Hemoglobin: 15.1 g/dL (ref 13.2–17.1)
MCH: 28.8 pg (ref 27.0–33.0)
MCHC: 33.6 g/dL (ref 32.0–36.0)
MCV: 85.7 fL (ref 80.0–100.0)
MPV: 10.8 fL (ref 7.5–12.5)
Platelets: 338 10*3/uL (ref 140–400)
RBC: 5.24 10*6/uL (ref 4.20–5.80)
RDW: 12.7 % (ref 11.0–15.0)
WBC: 8.2 10*3/uL (ref 3.8–10.8)

## 2018-06-28 LAB — ANA: ANA: NEGATIVE

## 2018-06-28 LAB — URIC ACID: Uric Acid, Serum: 5.7 mg/dL (ref 4.0–8.0)

## 2018-06-28 LAB — SEDIMENTATION RATE: Sed Rate: 2 mm/h (ref 0–20)

## 2018-06-28 LAB — RHEUMATOID FACTOR: RHEUMATOID FACTOR: 18 [IU]/mL — AB (ref <14)

## 2018-07-05 NOTE — Pre-Procedure Instructions (Signed)
Mateen Franssen  07/05/2018      [place], [address] [place] [address] Phone: [phone] Fax: [place], [place] - [address] [place] [address] Phone: [phone] Fax: [phone]    Your procedure is scheduled on July 19  Report to [facility] Admitting at 8:00 A.M.  Call this number if you have problems the morning of surgery:  [phone]   Remember:  Do not eat or drink after midnight.                 Take these medicines the morning of surgery with A SIP OF WATER : tylenol #3, amlodipine (norvasc)              7 days prior to surgery STOP taking any Aspirin(unless otherwise instructed by your surgeon), Aleve, Naproxen, Ibuprofen, Motrin, Advil, Goody's, BC's, all herbal medications, fish oil, and all vitamins    Do not wear jewelry.  Do not wear lotions, powders, or perfumes, or deodorant.  Do not shave 48 hours prior to surgery.  Men may shave face and neck.  Do not bring valuables to the hospital.  [facility] is not responsible for any belongings or valuables.  Contacts, dentures or bridgework may not be worn into surgery.  Leave your suitcase in the car.  After surgery it may be brought to your room.  For patients admitted to the hospital, discharge time will be determined by your treatment team.  Patients discharged the day of surgery will not be allowed to drive home.    Special instructions:   [facility]- Preparing For Surgery  Before surgery, you can play an important role. Because skin is not sterile, your skin needs to be as free of germs as possible. You can reduce the number of germs on your skin by washing with CHG (chlorahexidine gluconate) Soap before surgery.  CHG is an antiseptic cleaner which kills germs and bonds with the skin to continue killing germs even after washing.    Oral Hygiene  is also important to reduce your risk of infection.  Remember - BRUSH YOUR TEETH THE MORNING OF SURGERY WITH YOUR REGULAR TOOTHPASTE  Please do not use if you have an allergy to CHG or antibacterial soaps. If your skin becomes reddened/irritated stop using the CHG.  Do not shave (including legs and underarms) for at least 48 hours prior to first CHG shower. It is OK to shave your face.  Please follow these instructions carefully.   1. Shower the NIGHT BEFORE SURGERY and the MORNING OF SURGERY with CHG.   2. If you chose to wash your hair, wash your hair first as usual with your normal shampoo.  3. After you shampoo, rinse your hair and body thoroughly to remove the shampoo.  4. Use CHG as you would any other liquid soap. You can apply CHG directly to the skin and wash gently with a scrungie or a clean washcloth.   5. Apply the CHG Soap to your body ONLY FROM THE NECK DOWN.  Do not use on open wounds or open sores. Avoid contact with your eyes, ears, mouth and genitals (private parts). Wash Face and genitals (private parts)  with your normal soap.  6. Wash thoroughly, paying special attention to the area where your surgery will be performed.  7. Thoroughly rinse your body with warm water  from the neck down.  8. DO NOT shower/wash with your normal soap after using and rinsing off the CHG Soap.  9. Pat yourself dry with a CLEAN TOWEL.  10. Wear CLEAN PAJAMAS to bed the night before surgery, wear comfortable clothes the morning of surgery  11. Place CLEAN SHEETS on your bed the night of your first shower and DO NOT SLEEP WITH PETS.    Day of Surgery:  Do not apply any deodorants/lotions.  Please wear clean clothes to the hospital/surgery center.   Remember to brush your teeth WITH YOUR REGULAR TOOTHPASTE.    Please read over the following fact sheets that you were given. Coughing and Deep Breathing, MRSA Information and Surgical Site Infection Prevention

## 2018-07-06 ENCOUNTER — Ambulatory Visit: Payer: 59 | Admitting: Sports Medicine

## 2018-07-06 ENCOUNTER — Encounter (HOSPITAL_COMMUNITY)
Admission: RE | Admit: 2018-07-06 | Discharge: 2018-07-06 | Disposition: A | Discharge status: Home / Self Care | Payer: 59 | Source: Ambulatory Visit | Attending: Surgery | Admitting: Surgery

## 2018-07-06 ENCOUNTER — Encounter (HOSPITAL_COMMUNITY): Payer: Self-pay

## 2018-07-06 ENCOUNTER — Other Ambulatory Visit: Payer: Self-pay

## 2018-07-06 ENCOUNTER — Other Ambulatory Visit (INDEPENDENT_AMBULATORY_CARE_PROVIDER_SITE_OTHER): Payer: Self-pay

## 2018-07-06 ENCOUNTER — Encounter (HOSPITAL_COMMUNITY)
Admission: RE | Admit: 2018-07-06 | Discharge: 2018-07-06 | Disposition: A | Discharge status: Home / Self Care | Payer: 59 | Source: Ambulatory Visit | Attending: Orthopaedic Surgery | Admitting: Orthopaedic Surgery

## 2018-07-06 DIAGNOSIS — Z01818 Encounter for other preprocedural examination: Secondary | ICD-10-CM | POA: Diagnosis present

## 2018-07-06 DIAGNOSIS — K219 Gastro-esophageal reflux disease without esophagitis: Secondary | ICD-10-CM | POA: Diagnosis not present

## 2018-07-06 DIAGNOSIS — I10 Essential (primary) hypertension: Secondary | ICD-10-CM | POA: Insufficient documentation

## 2018-07-06 DIAGNOSIS — M1711 Unilateral primary osteoarthritis, right knee: Secondary | ICD-10-CM | POA: Insufficient documentation

## 2018-07-06 DIAGNOSIS — Z01810 Encounter for preprocedural cardiovascular examination: Secondary | ICD-10-CM | POA: Diagnosis not present

## 2018-07-06 DIAGNOSIS — Z01812 Encounter for preprocedural laboratory examination: Secondary | ICD-10-CM | POA: Diagnosis not present

## 2018-07-06 HISTORY — DX: Gastro-esophageal reflux disease without esophagitis: K21.9

## 2018-07-06 HISTORY — DX: Unilateral primary osteoarthritis, right knee: M17.11

## 2018-07-06 LAB — COMPREHENSIVE METABOLIC PANEL
ALT: 49 U/L — AB (ref 0–44)
AST: 35 U/L (ref 15–41)
Albumin: 4.1 g/dL (ref 3.5–5.0)
Alkaline Phosphatase: 67 U/L (ref 38–126)
Anion gap: 10 (ref 5–15)
BUN: 14 mg/dL (ref 6–20)
CALCIUM: 9.5 mg/dL (ref 8.9–10.3)
CO2: 24 mmol/L (ref 22–32)
CREATININE: 0.85 mg/dL (ref 0.61–1.24)
Chloride: 102 mmol/L (ref 98–111)
GFR calc non Af Amer: >60 mL/min (ref >60)
Glucose, Bld: 86 mg/dL (ref 70–99)
Potassium: 3.9 mmol/L (ref 3.5–5.1)
SODIUM: 136 mmol/L (ref 135–145)
Total Bilirubin: 0.7 mg/dL (ref 0.3–1.2)
Total Protein: 7.1 g/dL (ref 6.5–8.1)

## 2018-07-06 LAB — URINALYSIS, ROUTINE W REFLEX MICROSCOPIC
Bilirubin Urine: NEGATIVE
Glucose, UA: NEGATIVE mg/dL
Hgb urine dipstick: NEGATIVE
KETONES UR: NEGATIVE mg/dL
LEUKOCYTES UA: NEGATIVE
NITRITE: NEGATIVE
PH: 5 (ref 5.0–8.0)
Protein, ur: NEGATIVE mg/dL
SPECIFIC GRAVITY, URINE: 1.02 (ref 1.005–1.030)

## 2018-07-06 LAB — CBC
HCT: 42.4 % (ref 39.0–52.0)
HEMOGLOBIN: 13.9 g/dL (ref 13.0–17.0)
MCH: 28.8 pg (ref 26.0–34.0)
MCHC: 32.8 g/dL (ref 30.0–36.0)
MCV: 87.8 fL (ref 78.0–100.0)
Platelets: 316 10*3/uL (ref 150–400)
RBC: 4.83 MIL/uL (ref 4.22–5.81)
RDW: 12.9 % (ref 11.5–15.5)
WBC: 9.3 10*3/uL (ref 4.0–10.5)

## 2018-07-06 LAB — PROTIME-INR
INR: 0.96
PROTHROMBIN TIME: 12.6 s (ref 11.4–15.2)

## 2018-07-06 LAB — SURGICAL PCR SCREEN
MRSA, PCR: NEGATIVE
STAPHYLOCOCCUS AUREUS: NEGATIVE

## 2018-07-06 NOTE — Progress Notes (Signed)
PCP - Dr. Cammie Sickle  Cardiologist - Denies  Chest x-ray - 07/06/18  EKG - 07/06/18  Stress Test - 02/28/10 (E)  ECHO - 10/17/03 (E)  Cardiac Cath - Denies  Sleep Study - Denies CPAP - None  LABS- 07/06/18: CBC, CMP, PT, UA  ASA- Denies  Anesthesia- No  Pt denies having chest pain, sob, or fever at this time. All instructions explained to the pt, with a verbal understanding of the material. Pt agrees to go over the instructions while at home for a better understanding. The opportunity to ask questions was provided.

## 2018-07-07 ENCOUNTER — Ambulatory Visit (INDEPENDENT_AMBULATORY_CARE_PROVIDER_SITE_OTHER): Payer: 59 | Admitting: Surgery

## 2018-07-07 ENCOUNTER — Encounter (INDEPENDENT_AMBULATORY_CARE_PROVIDER_SITE_OTHER): Payer: Self-pay | Admitting: Surgery

## 2018-07-07 VITALS — BP 140/81 | HR 79 | Temp 98.6°F

## 2018-07-07 DIAGNOSIS — M1711 Unilateral primary osteoarthritis, right knee: Secondary | ICD-10-CM

## 2018-07-07 DIAGNOSIS — D568 Other thalassemias: Secondary | ICD-10-CM | POA: Diagnosis not present

## 2018-07-07 NOTE — Progress Notes (Signed)
52 year old black male with history of end-stage DJD right knee and chronic pain presented today for preop evaluation for total knee replacement.  I reviewed labs that were drawn at last office visit and this did show an elevated rheumatoid factor of 18.  We will plan to proceed with total knee replacement as scheduled.  Will make referral to rheumatologist Dr. Gavin Pound.  All questions answered.  Anticipate patient will also be out of work at least 3 to 6 months postop.

## 2018-07-07 NOTE — H&P (Signed)
TOTAL KNEE ADMISSION H&P  Patient is being admitted for bilaterally total knee arthroplasty.  Subjective:  Chief Complaint:right knee pain.  HPI: [name], 52 y.o. male, has a history of pain and functional disability in the right knee due to arthritis and has failed non-surgical conservative treatments for greater than 12 weeks to includeNSAID's and/or analgesics, corticosteriod injections, use of assistive devices, weight reduction as appropriate and activity modification.  Onset of symptoms was gradual, starting 10 years ago with gradually worsening course since that time.Patient currently rates pain in the right knee(s) at 10 out of 10 with activity. Patient has worsening of pain with activity and weight bearing, pain that interferes with activities of daily living, pain with passive range of motion, crepitus and joint swelling.  Patient has evidence of subchondral cysts, subchondral sclerosis, periarticular osteophytes and joint space narrowing by imaging studies.  There is no active infection.  Patient Active Problem List   Diagnosis Date Noted  . Chronic pain of right knee 10/11/2017  . Prediabetes 01/02/2017  . Essential hypertension 09/04/2016   Past Medical History:  Diagnosis Date  . Arthritis of knee, right   . GERD (gastroesophageal reflux disease)   . Hypertension     Past Surgical History:  Procedure Laterality Date  . CHOLECYSTECTOMY      No current facility-administered medications for this encounter.    Current Outpatient Medications  Medication Sig Dispense Refill Last Dose  . acetaminophen-codeine (TYLENOL #3) 300-30 MG tablet Take 1 tablet by mouth every 6 (six) hours as needed for moderate pain. 15 tablet 0 Taking  . amLODipine (NORVASC) 10 MG tablet Take 1 tablet (10 mg total) by mouth daily. 90 tablet 1 Taking  . meloxicam (MOBIC) 7.5 MG tablet Take 1-2 tablets (7.5-15 mg total) by mouth daily. (Patient taking differently: Take 7.5 mg by mouth daily as  needed for pain. ) 30 tablet 5 Taking  . naproxen sodium (ALEVE) 220 MG tablet Take 440 mg by mouth daily as needed (pain).      No Known Allergies  Social History   Tobacco Use  . Smoking status: Former Research scientist (life sciences)  . Smokeless tobacco: Never Used  Substance Use Topics  . Alcohol use: No    Family History  Problem Relation Age of Onset  . Cancer Father        prostate   . Cancer Maternal Grandfather        prostate     Review of Systems  Constitutional: Negative.   HENT: Negative.   Eyes: Negative.   Respiratory: Negative.   Cardiovascular: Negative.   Gastrointestinal: Negative.   Genitourinary: Negative.   Musculoskeletal: Positive for joint pain.  Skin: Negative.   Neurological: Negative.   Psychiatric/Behavioral: Negative.     Objective:  Physical Exam  Constitutional: He is oriented to person, place, and time. He appears well-developed and well-nourished. No distress.  HENT:  Head: Normocephalic and atraumatic.  Eyes: Pupils are equal, round, and reactive to light. EOM are normal.  Neck: Normal range of motion.  Cardiovascular: Normal rate and normal heart sounds.  Respiratory: Effort normal and breath sounds normal. No respiratory distress. He has no wheezes.  GI: Soft. Bowel sounds are normal. There is no tenderness.  Musculoskeletal: He exhibits tenderness.  Right knee positive crepitus.  Positive patellar grind.  Joint line tender.  Neurological: He is alert and oriented to person, place, and time.  Skin: Skin is warm and dry.  Psychiatric: He has a normal mood and affect.  Vital signs in last 24 hours: Temp:  [98.5 F (36.9 C)-98.6 F (37 C)] 98.6 F (37 C) (07/10 0932) Pulse Rate:  [69-79] 79 (07/10 0932) Resp:  [18] 18 (07/09 1506) BP: (140)/(80-81) 140/81 (07/10 0932) SpO2:  [98 %] 98 % (07/09 1506) Weight:  [248 lb 3.2 oz (112.6 kg)] 248 lb 3.2 oz (112.6 kg) (07/09 1506)  Labs:   Estimated body mass index is 35.61 kg/m as calculated from  the following:   Height as of 07/06/18: 5\' 10"  (1.778 m).   Weight as of 07/06/18: 248 lb 3.2 oz (112.6 kg).   Imaging Review Plain radiographs demonstrate moderate degenerative joint disease of the right knee(s). The overall alignment ismild varus. The bone quality appears to be good for age and reported activity level.   Preoperative templating of the joint replacement has been completed, documented, and submitted to the Operating Room personnel in order to optimize intra-operative equipment management.       Assessment/Plan:  End stage arthritis, right knee   The patient history, physical examination, clinical judgment of the provider and imaging studies are consistent with end stage degenerative joint disease of the right knee(s) and total knee arthroplasty is deemed medically necessary. The treatment options including medical management, injection therapy arthroscopy and arthroplasty were discussed at length. The risks and benefits of total knee arthroplasty were presented and reviewed. The risks due to aseptic loosening, infection, stiffness, patella tracking problems, thromboembolic complications and other imponderables were discussed. The patient acknowledged the explanation, agreed to proceed with the plan and consent was signed. Patient is being admitted for inpatient treatment for surgery, pain control, PT, OT, prophylactic antibiotics, VTE prophylaxis, progressive ambulation and ADL's and discharge planning. The patient is planning to be discharged home with home health services

## 2018-07-16 ENCOUNTER — Inpatient Hospital Stay (HOSPITAL_COMMUNITY): Payer: 59 | Admitting: Anesthesiology

## 2018-07-16 ENCOUNTER — Encounter (HOSPITAL_COMMUNITY)
Admission: RE | Disposition: A | Discharge status: Home / Self Care | Payer: Self-pay | Source: Home / Self Care | Attending: Orthopaedic Surgery

## 2018-07-16 ENCOUNTER — Inpatient Hospital Stay (HOSPITAL_COMMUNITY)
Admission: RE | Admit: 2018-07-16 | Discharge: 2018-07-19 | DRG: 470 | Disposition: A | Discharge status: Home / Self Care | Payer: 59 | Attending: Orthopaedic Surgery | Admitting: Orthopaedic Surgery

## 2018-07-16 ENCOUNTER — Other Ambulatory Visit: Payer: Self-pay

## 2018-07-16 ENCOUNTER — Encounter (HOSPITAL_COMMUNITY): Payer: Self-pay

## 2018-07-16 DIAGNOSIS — M1711 Unilateral primary osteoarthritis, right knee: Secondary | ICD-10-CM | POA: Diagnosis present

## 2018-07-16 DIAGNOSIS — K219 Gastro-esophageal reflux disease without esophagitis: Secondary | ICD-10-CM | POA: Diagnosis present

## 2018-07-16 DIAGNOSIS — I10 Essential (primary) hypertension: Secondary | ICD-10-CM | POA: Diagnosis present

## 2018-07-16 DIAGNOSIS — D62 Acute posthemorrhagic anemia: Secondary | ICD-10-CM | POA: Diagnosis not present

## 2018-07-16 DIAGNOSIS — Z79899 Other long term (current) drug therapy: Secondary | ICD-10-CM

## 2018-07-16 DIAGNOSIS — Z87891 Personal history of nicotine dependence: Secondary | ICD-10-CM

## 2018-07-16 DIAGNOSIS — M25561 Pain in right knee: Secondary | ICD-10-CM | POA: Diagnosis present

## 2018-07-16 HISTORY — PX: TOTAL KNEE ARTHROPLASTY: SHX125

## 2018-07-16 SURGERY — ARTHROPLASTY, KNEE, TOTAL
Anesthesia: General | Site: Knee | Laterality: Right

## 2018-07-16 MED ORDER — EPHEDRINE SULFATE 50 MG/ML IJ SOLN
INTRAMUSCULAR | Status: DC | PRN
  Administered 2018-07-16: 10 mg via INTRAVENOUS

## 2018-07-16 MED ORDER — PHENOL 1.4 % MT LIQD: 1.0000 | OROMUCOSAL | Status: DC | PRN

## 2018-07-16 MED ORDER — METOCLOPRAMIDE HCL 5 MG/ML IJ SOLN: 5.0000 mg | Freq: Three times a day (TID) | INTRAMUSCULAR | Status: DC | PRN

## 2018-07-16 MED ORDER — MIDAZOLAM HCL 2 MG/2ML IJ SOLN
2.0000 mg | Freq: Once | INTRAMUSCULAR | Status: AC
  Administered 2018-07-16: 2 mg via INTRAVENOUS

## 2018-07-16 MED ORDER — METHOCARBAMOL 1000 MG/10ML IJ SOLN
500.0000 mg | Freq: Four times a day (QID) | INTRAVENOUS | Status: DC | PRN
  Filled 2018-07-16: qty 5

## 2018-07-16 MED ORDER — PHENYLEPHRINE 40 MCG/ML (10ML) SYRINGE FOR IV PUSH (FOR BLOOD PRESSURE SUPPORT)
PREFILLED_SYRINGE | INTRAVENOUS | Status: AC
  Filled 2018-07-16: qty 10

## 2018-07-16 MED ORDER — DOCUSATE SODIUM 100 MG PO CAPS
100.0000 mg | ORAL_CAPSULE | Freq: Two times a day (BID) | ORAL | Status: DC
  Administered 2018-07-16 – 2018-07-19 (×6): 100 mg via ORAL
  Filled 2018-07-16 (×6): qty 1

## 2018-07-16 MED ORDER — CEFAZOLIN SODIUM-DEXTROSE 2-4 GM/100ML-% IV SOLN
INTRAVENOUS | Status: AC
  Filled 2018-07-16: qty 100

## 2018-07-16 MED ORDER — MENTHOL 3 MG MT LOZG: 1.0000 | LOZENGE | OROMUCOSAL | Status: DC | PRN

## 2018-07-16 MED ORDER — FENTANYL CITRATE (PF) 100 MCG/2ML IJ SOLN
100.0000 ug | Freq: Once | INTRAMUSCULAR | Status: AC
  Administered 2018-07-16: 100 ug via INTRAVENOUS

## 2018-07-16 MED ORDER — CEFAZOLIN SODIUM-DEXTROSE 1-4 GM/50ML-% IV SOLN
1.0000 g | Freq: Three times a day (TID) | INTRAVENOUS | Status: AC
  Administered 2018-07-16 – 2018-07-17 (×2): 1 g via INTRAVENOUS
  Filled 2018-07-16 (×2): qty 50

## 2018-07-16 MED ORDER — PROPOFOL 10 MG/ML IV BOLUS
INTRAVENOUS | Status: AC
  Filled 2018-07-16: qty 20

## 2018-07-16 MED ORDER — ACETAMINOPHEN 325 MG PO TABS
325.0000 mg | ORAL_TABLET | Freq: Four times a day (QID) | ORAL | Status: DC | PRN
  Administered 2018-07-17 – 2018-07-19 (×4): 650 mg via ORAL
  Filled 2018-07-16 (×4): qty 2

## 2018-07-16 MED ORDER — FENTANYL CITRATE (PF) 100 MCG/2ML IJ SOLN
INTRAMUSCULAR | Status: DC | PRN
  Administered 2018-07-16: 50 ug via INTRAVENOUS

## 2018-07-16 MED ORDER — BUPIVACAINE LIPOSOME 1.3 % IJ SUSP
20.0000 mL | INTRAMUSCULAR | Status: DC
  Filled 2018-07-16: qty 20

## 2018-07-16 MED ORDER — CEFAZOLIN SODIUM-DEXTROSE 2-4 GM/100ML-% IV SOLN
2.0000 g | INTRAVENOUS | Status: AC
  Administered 2018-07-16: 2 g via INTRAVENOUS

## 2018-07-16 MED ORDER — PROPOFOL 500 MG/50ML IV EMUL
INTRAVENOUS | Status: DC | PRN
  Administered 2018-07-16: 40 ug/kg/min via INTRAVENOUS
  Administered 2018-07-16: 75 ug/kg/min via INTRAVENOUS

## 2018-07-16 MED ORDER — LACTATED RINGERS IV SOLN
INTRAVENOUS | Status: DC
  Administered 2018-07-16 (×2): via INTRAVENOUS

## 2018-07-16 MED ORDER — 0.9 % SODIUM CHLORIDE (POUR BTL) OPTIME
TOPICAL | Status: DC | PRN
  Administered 2018-07-16: 1000 mL

## 2018-07-16 MED ORDER — OXYCODONE HCL 5 MG PO TABS
5.0000 mg | ORAL_TABLET | ORAL | Status: DC | PRN
  Administered 2018-07-16 – 2018-07-17 (×2): 10 mg via ORAL
  Administered 2018-07-17: 5 mg via ORAL
  Administered 2018-07-18 (×5): 10 mg via ORAL
  Administered 2018-07-19: 5 mg via ORAL
  Administered 2018-07-19: 10 mg via ORAL
  Administered 2018-07-19: 5 mg via ORAL
  Filled 2018-07-16 (×5): qty 2
  Filled 2018-07-16: qty 1
  Filled 2018-07-16: qty 2
  Filled 2018-07-16: qty 1
  Filled 2018-07-16 (×3): qty 2

## 2018-07-16 MED ORDER — BUPIVACAINE IN DEXTROSE 0.75-8.25 % IT SOLN
INTRATHECAL | Status: DC | PRN
  Administered 2018-07-16: 2 mL via INTRATHECAL

## 2018-07-16 MED ORDER — AMLODIPINE BESYLATE 10 MG PO TABS
10.0000 mg | ORAL_TABLET | Freq: Every day | ORAL | Status: DC
  Administered 2018-07-17 – 2018-07-19 (×3): 10 mg via ORAL
  Filled 2018-07-16 (×3): qty 1

## 2018-07-16 MED ORDER — ONDANSETRON HCL 4 MG/2ML IJ SOLN
INTRAMUSCULAR | Status: DC | PRN
  Administered 2018-07-16: 4 mg via INTRAVENOUS

## 2018-07-16 MED ORDER — TRANEXAMIC ACID 1000 MG/10ML IV SOLN
1000.0000 mg | INTRAVENOUS | Status: AC
  Administered 2018-07-16: 1000 mg via INTRAVENOUS
  Filled 2018-07-16: qty 1100

## 2018-07-16 MED ORDER — ONDANSETRON HCL 4 MG/2ML IJ SOLN: 4.0000 mg | Freq: Four times a day (QID) | INTRAMUSCULAR | Status: DC | PRN

## 2018-07-16 MED ORDER — HYDROMORPHONE HCL 1 MG/ML IJ SOLN: 0.5000 mg | INTRAMUSCULAR | Status: AC | PRN

## 2018-07-16 MED ORDER — DEXAMETHASONE SODIUM PHOSPHATE 10 MG/ML IJ SOLN
INTRAMUSCULAR | Status: AC
  Filled 2018-07-16: qty 1

## 2018-07-16 MED ORDER — BUPIVACAINE HCL (PF) 0.25 % IJ SOLN
INTRAMUSCULAR | Status: AC
  Filled 2018-07-16: qty 30

## 2018-07-16 MED ORDER — BUPIVACAINE HCL (PF) 0.25 % IJ SOLN
INTRAMUSCULAR | Status: DC | PRN
  Administered 2018-07-16: 20 mL

## 2018-07-16 MED ORDER — METHOCARBAMOL 500 MG PO TABS
500.0000 mg | ORAL_TABLET | Freq: Four times a day (QID) | ORAL | Status: DC | PRN
  Administered 2018-07-16 – 2018-07-19 (×7): 500 mg via ORAL
  Filled 2018-07-16 (×7): qty 1

## 2018-07-16 MED ORDER — ROPIVACAINE HCL 7.5 MG/ML IJ SOLN
INTRAMUSCULAR | Status: DC | PRN
  Administered 2018-07-16: 25 mL via PERINEURAL

## 2018-07-16 MED ORDER — FENTANYL CITRATE (PF) 250 MCG/5ML IJ SOLN
INTRAMUSCULAR | Status: AC
  Filled 2018-07-16: qty 5

## 2018-07-16 MED ORDER — SODIUM CHLORIDE 0.9 % IV SOLN: INTRAVENOUS | Status: DC

## 2018-07-16 MED ORDER — DEXAMETHASONE SODIUM PHOSPHATE 10 MG/ML IJ SOLN
INTRAMUSCULAR | Status: DC | PRN
  Administered 2018-07-16: 10 mg via INTRAVENOUS

## 2018-07-16 MED ORDER — SENNOSIDES-DOCUSATE SODIUM 8.6-50 MG PO TABS: 1.0000 | ORAL_TABLET | Freq: Every evening | ORAL | Status: DC | PRN

## 2018-07-16 MED ORDER — CHLORHEXIDINE GLUCONATE 4 % EX LIQD: 60.0000 mL | Freq: Once | CUTANEOUS | Status: DC

## 2018-07-16 MED ORDER — BUPIVACAINE LIPOSOME 1.3 % IJ SUSP
INTRAMUSCULAR | Status: DC | PRN
  Administered 2018-07-16: 20 mL

## 2018-07-16 MED ORDER — LIDOCAINE 2% (20 MG/ML) 5 ML SYRINGE
INTRAMUSCULAR | Status: AC
  Filled 2018-07-16: qty 5

## 2018-07-16 MED ORDER — FENTANYL CITRATE (PF) 100 MCG/2ML IJ SOLN
INTRAMUSCULAR | Status: AC
  Administered 2018-07-16: 100 ug via INTRAVENOUS
  Filled 2018-07-16: qty 2

## 2018-07-16 MED ORDER — PROPOFOL 10 MG/ML IV BOLUS
INTRAVENOUS | Status: DC | PRN
  Administered 2018-07-16: 10 mg via INTRAVENOUS
  Administered 2018-07-16: 20 mg via INTRAVENOUS

## 2018-07-16 MED ORDER — ASPIRIN EC 325 MG PO TBEC
325.0000 mg | DELAYED_RELEASE_TABLET | Freq: Every day | ORAL | Status: DC
  Administered 2018-07-17 – 2018-07-19 (×3): 325 mg via ORAL
  Filled 2018-07-16 (×3): qty 1

## 2018-07-16 MED ORDER — MIDAZOLAM HCL 2 MG/2ML IJ SOLN
INTRAMUSCULAR | Status: AC
  Administered 2018-07-16: 2 mg via INTRAVENOUS
  Filled 2018-07-16: qty 2

## 2018-07-16 MED ORDER — PHENYLEPHRINE 40 MCG/ML (10ML) SYRINGE FOR IV PUSH (FOR BLOOD PRESSURE SUPPORT)
PREFILLED_SYRINGE | INTRAVENOUS | Status: DC | PRN
  Administered 2018-07-16: 80 ug via INTRAVENOUS

## 2018-07-16 MED ORDER — METOCLOPRAMIDE HCL 5 MG PO TABS: 5.0000 mg | ORAL_TABLET | Freq: Three times a day (TID) | ORAL | Status: DC | PRN

## 2018-07-16 MED ORDER — ONDANSETRON HCL 4 MG PO TABS: 4.0000 mg | ORAL_TABLET | Freq: Four times a day (QID) | ORAL | Status: DC | PRN

## 2018-07-16 MED ORDER — ONDANSETRON HCL 4 MG/2ML IJ SOLN
INTRAMUSCULAR | Status: AC
  Filled 2018-07-16: qty 2

## 2018-07-16 MED ORDER — SODIUM CHLORIDE 0.9 % IR SOLN
Status: DC | PRN
  Administered 2018-07-16: 3000 mL

## 2018-07-16 SURGICAL SUPPLY — 67 items
BANDAGE ACE 4X5 VEL STRL LF (GAUZE/BANDAGES/DRESSINGS) ×3 IMPLANT
BANDAGE ACE 6X5 VEL STRL LF (GAUZE/BANDAGES/DRESSINGS) ×3 IMPLANT
BANDAGE ESMARK 6X9 LF (GAUZE/BANDAGES/DRESSINGS) ×1 IMPLANT
BENZOIN TINCTURE PRP APPL 2/3 (GAUZE/BANDAGES/DRESSINGS) ×3 IMPLANT
BLADE SAGITTAL 25.0X1.19X90 (BLADE) ×2 IMPLANT
BLADE SAGITTAL 25.0X1.19X90MM (BLADE) ×1
BLADE SAW SGTL 13X75X1.27 (BLADE) ×3 IMPLANT
BNDG ELASTIC 6X10 VLCR STRL LF (GAUZE/BANDAGES/DRESSINGS) ×3 IMPLANT
BNDG ESMARK 6X9 LF (GAUZE/BANDAGES/DRESSINGS) ×3
BOWL SMART MIX CTS (DISPOSABLE) ×3 IMPLANT
CAPT KNEE TOTAL 3 ATTUNE ×3 IMPLANT
CEMENT HV SMART SET (Cement) ×6 IMPLANT
CLOSURE WOUND 1/2 X4 (GAUZE/BANDAGES/DRESSINGS) ×2
COVER SURGICAL LIGHT HANDLE (MISCELLANEOUS) ×3 IMPLANT
CUFF TOURNIQUET SINGLE 34IN LL (TOURNIQUET CUFF) ×3 IMPLANT
CUFF TOURNIQUET SINGLE 44IN (TOURNIQUET CUFF) IMPLANT
DRAPE ORTHO SPLIT 77X108 STRL (DRAPES) ×4
DRAPE SURG ORHT 6 SPLT 77X108 (DRAPES) ×2 IMPLANT
DRAPE U-SHAPE 47X51 STRL (DRAPES) ×3 IMPLANT
DRSG PAD ABDOMINAL 8X10 ST (GAUZE/BANDAGES/DRESSINGS) ×3 IMPLANT
DURAPREP 26ML APPLICATOR (WOUND CARE) ×6 IMPLANT
ELECT REM PT RETURN 9FT ADLT (ELECTROSURGICAL) ×3
ELECTRODE REM PT RTRN 9FT ADLT (ELECTROSURGICAL) ×1 IMPLANT
EVACUATOR 1/8 PVC DRAIN (DRAIN) IMPLANT
FACESHIELD WRAPAROUND (MASK) ×6 IMPLANT
GAUZE SPONGE 4X4 12PLY STRL (GAUZE/BANDAGES/DRESSINGS) ×3 IMPLANT
GAUZE XEROFORM 5X9 LF (GAUZE/BANDAGES/DRESSINGS) ×3 IMPLANT
GLOVE BIOGEL PI IND STRL 8 (GLOVE) ×2 IMPLANT
GLOVE BIOGEL PI INDICATOR 8 (GLOVE) ×4
GLOVE ORTHO TXT STRL SZ7.5 (GLOVE) ×6 IMPLANT
GOWN STRL REUS W/ TWL LRG LVL3 (GOWN DISPOSABLE) ×1 IMPLANT
GOWN STRL REUS W/ TWL XL LVL3 (GOWN DISPOSABLE) ×1 IMPLANT
GOWN STRL REUS W/TWL 2XL LVL3 (GOWN DISPOSABLE) ×3 IMPLANT
GOWN STRL REUS W/TWL LRG LVL3 (GOWN DISPOSABLE) ×2
GOWN STRL REUS W/TWL XL LVL3 (GOWN DISPOSABLE) ×2
HANDPIECE INTERPULSE COAX TIP (DISPOSABLE) ×2
IMMOBILIZER KNEE 22 UNIV (SOFTGOODS) ×3 IMPLANT
KIT BASIN OR (CUSTOM PROCEDURE TRAY) ×3 IMPLANT
KIT TURNOVER KIT B (KITS) ×3 IMPLANT
MANIFOLD NEPTUNE II (INSTRUMENTS) ×3 IMPLANT
MARKER SKIN DUAL TIP RULER LAB (MISCELLANEOUS) ×3 IMPLANT
NEEDLE 18GX1X1/2 (RX/OR ONLY) (NEEDLE) ×3 IMPLANT
NEEDLE HYPO 25GX1X1/2 BEV (NEEDLE) ×3 IMPLANT
NS IRRIG 1000ML POUR BTL (IV SOLUTION) ×3 IMPLANT
PACK TOTAL JOINT (CUSTOM PROCEDURE TRAY) ×3 IMPLANT
PAD ABD 8X10 STRL (GAUZE/BANDAGES/DRESSINGS) ×3 IMPLANT
PAD ARMBOARD 7.5X6 YLW CONV (MISCELLANEOUS) ×6 IMPLANT
PAD CAST 4YDX4 CTTN HI CHSV (CAST SUPPLIES) ×1 IMPLANT
PADDING CAST COTTON 4X4 STRL (CAST SUPPLIES) ×2
PADDING CAST COTTON 6X4 STRL (CAST SUPPLIES) ×3 IMPLANT
SET HNDPC FAN SPRY TIP SCT (DISPOSABLE) ×1 IMPLANT
STAPLER VISISTAT 35W (STAPLE) IMPLANT
STRIP CLOSURE SKIN 1/2X4 (GAUZE/BANDAGES/DRESSINGS) ×4 IMPLANT
SUCTION FRAZIER HANDLE 10FR (MISCELLANEOUS) ×2
SUCTION TUBE FRAZIER 10FR DISP (MISCELLANEOUS) ×1 IMPLANT
SUT VIC AB 0 CT1 27 (SUTURE) ×2
SUT VIC AB 0 CT1 27XBRD ANBCTR (SUTURE) ×1 IMPLANT
SUT VIC AB 1 CTX 36 (SUTURE) ×4
SUT VIC AB 1 CTX36XBRD ANBCTR (SUTURE) ×2 IMPLANT
SUT VIC AB 2-0 CT1 27 (SUTURE) ×4
SUT VIC AB 2-0 CT1 TAPERPNT 27 (SUTURE) ×2 IMPLANT
SUT VIC AB 3-0 X1 27 (SUTURE) ×3 IMPLANT
SYR 50ML LL SCALE MARK (SYRINGE) ×3 IMPLANT
SYR CONTROL 10ML LL (SYRINGE) ×3 IMPLANT
TOWEL OR 17X24 6PK STRL BLUE (TOWEL DISPOSABLE) ×3 IMPLANT
TOWEL OR 17X26 10 PK STRL BLUE (TOWEL DISPOSABLE) ×3 IMPLANT
TRAY CATH 16FR W/PLASTIC CATH (SET/KITS/TRAYS/PACK) IMPLANT

## 2018-07-16 NOTE — Addendum Note (Signed)
Addendum  created 07/16/18 1312 by Janeece Riggers, MD   Sign clinical note

## 2018-07-16 NOTE — Anesthesia Procedure Notes (Signed)
Spinal  Patient location during procedure: OR Start time: 07/16/2018 10:38 AM End time: 07/16/2018 10:42 AM Staffing Anesthesiologist: Janeece Riggers, MD Preanesthetic Checklist Completed: patient identified, site marked, surgical consent, pre-op evaluation, timeout performed, IV checked, risks and benefits discussed and monitors and equipment checked Spinal Block Patient position: sitting Prep: DuraPrep Patient monitoring: heart rate, cardiac monitor, continuous pulse ox and blood pressure Approach: midline Location: L3-4 Injection technique: single-shot Needle Needle type: Sprotte  Needle gauge: 22 G Needle length: 9 cm Assessment Sensory level: T4

## 2018-07-16 NOTE — Interval H&P Note (Signed)
History and Physical Interval Note:  07/16/2018 10:04 AM  [name]  has presented today for surgery, with the diagnosis of right knee arthritis  The various methods of treatment have been discussed with the patient and family. After consideration of risks, benefits and other options for treatment, the patient has consented to  Procedure(s): RIGHT TOTAL KNEE REPLACEMENT (Right) as a surgical intervention .  The patient's history has been reviewed, patient examined, no change in status, stable for surgery.  I have reviewed the patient's chart and labs.  Questions were answered to the patient's satisfaction.     Marybelle Killings

## 2018-07-16 NOTE — Anesthesia Procedure Notes (Addendum)
Anesthesia Regional Block: Adductor canal block   Pre-Anesthetic Checklist: ,, timeout performed, Correct Patient, Correct Site, Correct Laterality, Correct Procedure, Correct Position, site marked, Risks and benefits discussed,  Surgical consent,  Pre-op evaluation,  At surgeon's request and post-op pain management  Laterality: Right  Prep: chloraprep       Needles:  Injection technique: Single-shot  Needle Type: Echogenic Stimulator Needle     Needle Length: 5cm  Needle Gauge: 22     Additional Needles:   Procedures:, nerve stimulator,,, ultrasound used (permanent image in chart),,,,  Narrative:  Start time: 07/16/2018 9:28 AM End time: 07/16/2018 9:35 AM Injection made incrementally with aspirations every 5 mL.  Performed by: Personally  Anesthesiologist: Janeece Riggers, MD  Additional Notes: Functioning IV was confirmed and monitors were applied.  A 36mm 22ga Arrow echogenic stimulator needle was used. Sterile prep and drape,hand hygiene and sterile gloves were used. Ultrasound guidance: relevant anatomy identified, needle position confirmed, local anesthetic spread visualized around nerve(s)., vascular puncture avoided.  Image printed for medical record. Negative aspiration and negative test dose prior to incremental administration of local anesthetic. The patient tolerated the procedure well.

## 2018-07-16 NOTE — Anesthesia Postprocedure Evaluation (Signed)
Anesthesia Post Note  Patient: [name]  Procedure(s) Performed: RIGHT TOTAL KNEE REPLACEMENT (Right Knee)     Patient location during evaluation: PACU Anesthesia Type: General Level of consciousness: awake and alert Pain management: pain level controlled Vital Signs Assessment: post-procedure vital signs reviewed and stable Respiratory status: spontaneous breathing, nonlabored ventilation, respiratory function stable and patient connected to nasal cannula oxygen Cardiovascular status: blood pressure returned to baseline and stable Postop Assessment: no apparent nausea or vomiting Anesthetic complications: no    Last Vitals:  Vitals:   07/16/18 0934 07/16/18 1302  BP: 119/76 (!) 108/95  Pulse: 78 71  Resp: 12 18  Temp:  (!) 36.1 C  SpO2: 96% 100%    Last Pain:  Vitals:   07/16/18 1302  TempSrc:   PainSc: 0-No pain    LLE Motor Response: No movement due to regional block (07/16/18 1302) LLE Sensation: No sensation (absent) (07/16/18 1302) RLE Motor Response: No movement due to regional block (07/16/18 1302) RLE Sensation: No sensation (absent) (07/16/18 1302) L Sensory Level: T12-Inguinal (groin) region (07/16/18 1302) R Sensory Level: T12-Inguinal (groin) region (07/16/18 1302)  Chaka Boyson

## 2018-07-16 NOTE — Transfer of Care (Signed)
Immediate Anesthesia Transfer of Care Note  Patient: [name]  Procedure(s) Performed: RIGHT TOTAL KNEE REPLACEMENT (Right Knee)  Patient Location: PACU  Anesthesia Type:MAC and Spinal  Level of Consciousness: awake, oriented and patient cooperative  Airway & Oxygen Therapy: Patient Spontanous Breathing and Patient connected to face mask oxygen  Post-op Assessment: Report given to RN and Post -op Vital signs reviewed and stable  Post vital signs: Reviewed and stable  Last Vitals:  Vitals Value Taken Time  BP    Temp    Pulse 73 07/16/2018  1:01 PM  Resp 16 07/16/2018  1:01 PM  SpO2 98 % 07/16/2018  1:01 PM  Vitals shown include unvalidated device data.  Last Pain:  Vitals:   07/16/18 0813  TempSrc:   PainSc: 0-No pain      Patients Stated Pain Goal: 3 (82/95/62 1308)  Complications: No apparent anesthesia complications

## 2018-07-16 NOTE — Anesthesia Preprocedure Evaluation (Addendum)
Anesthesia Evaluation  Patient identified by MRN, date of birth, ID band Patient awake    Reviewed: Allergy & Precautions, H&P , NPO status , Patient's Chart, lab work & pertinent test results, reviewed documented beta blocker date and time   Airway Mallampati: II  TM Distance: >3 FB Neck ROM: full    Dental no notable dental hx. (+) Teeth Intact, Dental Advisory Given   Pulmonary former smoker,    Pulmonary exam normal breath sounds clear to auscultation       Cardiovascular Exercise Tolerance: Good hypertension, Pt. on medications  Rhythm:regular Rate:Normal     Neuro/Psych    GI/Hepatic GERD  Medicated,  Endo/Other    Renal/GU      Musculoskeletal  (+) Arthritis , Osteoarthritis,    Abdominal   Peds  Hematology   Anesthesia Other Findings   Reproductive/Obstetrics                            Anesthesia Physical Anesthesia Plan  ASA: II  Anesthesia Plan: Spinal   Post-op Pain Management:  Regional for Post-op pain   Induction:   PONV Risk Score and Plan: 2 and Ondansetron and Treatment may vary due to age or medical condition  Airway Management Planned: Nasal Cannula, Natural Airway and Simple Face Mask  Additional Equipment:   Intra-op Plan:   Post-operative Plan:   Informed Consent: I have reviewed the patients History and Physical, chart, labs and discussed the procedure including the risks, benefits and alternatives for the proposed anesthesia with the patient or authorized representative who has indicated his/her understanding and acceptance.   Dental Advisory Given  Plan Discussed with: CRNA, Anesthesiologist and Surgeon  Anesthesia Plan Comments: (  )      Anesthesia Quick Evaluation

## 2018-07-16 NOTE — Anesthesia Postprocedure Evaluation (Signed)
Anesthesia Post Note  Patient: [name]  Procedure(s) Performed: RIGHT TOTAL KNEE REPLACEMENT (Right Knee)     Patient location during evaluation: PACU Anesthesia Type: Spinal Level of consciousness: oriented and awake and alert Pain management: pain level controlled Vital Signs Assessment: post-procedure vital signs reviewed and stable Respiratory status: spontaneous breathing, respiratory function stable and patient connected to nasal cannula oxygen Cardiovascular status: blood pressure returned to baseline and stable Postop Assessment: no headache, no backache and no apparent nausea or vomiting Anesthetic complications: no    Last Vitals:  Vitals:   07/16/18 0934 07/16/18 1302  BP: 119/76 (!) 108/95  Pulse: 78 71  Resp: 12 18  Temp:  (!) 36.1 C  SpO2: 96% 100%    Last Pain:  Vitals:   07/16/18 1302  TempSrc:   PainSc: 0-No pain    LLE Motor Response: No movement due to regional block (07/16/18 1302) LLE Sensation: No sensation (absent) (07/16/18 1302) RLE Motor Response: No movement due to regional block (07/16/18 1302) RLE Sensation: No sensation (absent) (07/16/18 1302) L Sensory Level: T12-Inguinal (groin) region (07/16/18 1302) R Sensory Level: T12-Inguinal (groin) region (07/16/18 1302)  Danaka Llera

## 2018-07-16 NOTE — Anesthesia Procedure Notes (Signed)
Procedure Name: MAC Date/Time: 07/16/2018 10:39 AM Performed by: Jenne Campus, CRNA Pre-anesthesia Checklist: Patient identified, Emergency Drugs available, Suction available and Patient being monitored Oxygen Delivery Method: Simple face mask

## 2018-07-16 NOTE — Op Note (Signed)
Preop diagnosis: Right knee primary osteoarthritis  Postop diagnosis: Same  Procedure: Right total knee arthroplasty, cemented  Surgeon: Rodell Perna, MD  Assistant: Benjiman Core, PA-C medically necessary and present for the entire procedure  Anesthesia: Preoperative abductor block plus spinal anesthesia plus Marcaine and Exparel prior to closure  Tourniquet time: Less than 1 hour x 350  Drains: None  Implants:Depuy Attune # 5 femur and #5 tibia, 5 mm rotating platform.  38 mm 3 peg patella.  Procedure: After induction spinal anesthesia with preoperative abductor block lateral post heel bump proximal thigh tourniquet DuraPrep from the tourniquet the tip of the toes preoperative Ancef prophylaxis and timeout procedure standard total knee sheets drapes sterile skin marker impervious stockinette Covan and Betadine Steri-Drape ceiling the skin was applied.  Ancef was given prophylactically and timeout procedure was done.  Leg was wrapped with an Esmarch tourniquet inflated.  Midline incision was made.  Medial parapatellar incision was made splitting medial aspect of the quad tendon.  Patella was everted 10 mm resected.  Intramedullary hole made in the femur and the notch and 9 mm cut was made on the distal femur set at 5 degrees.  Ankle clamp was used alignment was checked and 9 mm ribs removed from the tibia.  Meniscal remnants were resected as well as ACL PCL.  There is tricompartmental degenerative arthritis present.  Trial spacer 5 mm gave full extension.  Remaining meniscal remnants were resected chamfer cuts were made on the femur box cut keel preparation on the tibia which also was a #5 and trials were inserted knee reach full extension there was good collateral balance and slight stripping of one third portion of the anterior deep MCL.  This gave good flexion-extension balance in flexion and extension and no hyperextension.  Pulse lavage preparation and then cementing all 3 components  sequentially.  Poly-was inserted and cement was hard to 15 minutes all excessive cement of been removed.  Hemostasis obtained standard layer closure #1 Vicryl the deep true retinaculum.  2-0 Vicryl subtenons tissue skin staple closure postop dressing knee immobilizer.  Patient tolerated procedure well.

## 2018-07-17 LAB — BASIC METABOLIC PANEL
ANION GAP: 7 (ref 5–15)
BUN: 12 mg/dL (ref 6–20)
CO2: 26 mmol/L (ref 22–32)
Calcium: 8.9 mg/dL (ref 8.9–10.3)
Chloride: 100 mmol/L (ref 98–111)
Creatinine, Ser: 0.85 mg/dL (ref 0.61–1.24)
GFR calc Af Amer: >60 mL/min (ref >60)
GFR calc non Af Amer: >60 mL/min (ref >60)
GLUCOSE: 150 mg/dL — AB (ref 70–99)
POTASSIUM: 4.6 mmol/L (ref 3.5–5.1)
Sodium: 133 mmol/L — ABNORMAL LOW (ref 135–145)

## 2018-07-17 LAB — CBC
HCT: 37.9 % — ABNORMAL LOW (ref 39.0–52.0)
Hemoglobin: 12.8 g/dL — ABNORMAL LOW (ref 13.0–17.0)
MCH: 29.2 pg (ref 26.0–34.0)
MCHC: 33.8 g/dL (ref 30.0–36.0)
MCV: 86.5 fL (ref 78.0–100.0)
PLATELETS: 284 10*3/uL (ref 150–400)
RBC: 4.38 MIL/uL (ref 4.22–5.81)
RDW: 12.3 % (ref 11.5–15.5)
WBC: 14.4 10*3/uL — AB (ref 4.0–10.5)

## 2018-07-17 MED ORDER — ONDANSETRON HCL 4 MG PO TABS: 4.0000 mg | ORAL_TABLET | Freq: Four times a day (QID) | ORAL | 0 refills | Status: DC | PRN

## 2018-07-17 MED ORDER — ASPIRIN 325 MG PO TBEC: 325.0000 mg | DELAYED_RELEASE_TABLET | Freq: Every day | ORAL | 0 refills | Status: AC

## 2018-07-17 MED ORDER — OXYCODONE HCL 5 MG PO TABS: ORAL_TABLET | ORAL | 0 refills | Status: DC

## 2018-07-17 MED ORDER — SENNOSIDES-DOCUSATE SODIUM 8.6-50 MG PO TABS: 1.0000 | ORAL_TABLET | Freq: Every evening | ORAL | 0 refills | Status: DC | PRN

## 2018-07-17 MED ORDER — METHOCARBAMOL 500 MG PO TABS: 500.0000 mg | ORAL_TABLET | Freq: Three times a day (TID) | ORAL | 0 refills | Status: DC | PRN

## 2018-07-17 NOTE — Progress Notes (Signed)
Subjective: 1 Day Post-Op Procedure(s) (LRB): RIGHT TOTAL KNEE REPLACEMENT (Right) Patient reports pain as mild.  Doing well this am.   Objective: Vital signs in last 24 hours: Temp:  [97 F (36.1 C)-98.3 F (36.8 C)] 98 F (36.7 C) (07/20 0542) Pulse Rate:  [55-87] 75 (07/20 0542) Resp:  [12-18] 15 (07/19 1545) BP: (108-134)/(70-95) 126/77 (07/20 0542) SpO2:  [95 %-100 %] 96 % (07/20 0542)  Intake/Output from previous day: 07/19 0701 - 07/20 0700 In: 1200 [I.V.:1200] Out: 2000 [Urine:1950; Blood:50] Intake/Output this shift: No intake/output data recorded.  Recent Labs    07/17/18 0343  HGB 12.8*   Recent Labs    07/17/18 0343  WBC 14.4*  RBC 4.38  HCT 37.9*  PLT 284   Recent Labs    07/17/18 0343  NA 133*  K 4.6  CL 100  CO2 26  BUN 12  CREATININE 0.85  GLUCOSE 150*  CALCIUM 8.9   No results for input(s): LABPT, INR in the last 72 hours.  Neurologically intact Neurovascular intact Sensation intact distally Intact pulses distally Dorsiflexion/Plantar flexion intact Incision: dressing C/D/I No cellulitis present Compartment soft  Anticipated LOS equal to or greater than 2 midnights due to - Age 53 and older with one or more of the following:  - Obesity  - Expected need for hospital services (PT, OT, Nursing) required for safe  discharge  - Anticipated need for postoperative skilled nursing care or inpatient rehab  - Active co-morbidities: None OR   - Unanticipated findings during/Post Surgery: Slow post-op progression: GI, pain control, mobility  - Patient is a high risk of re-admission due to: None   Assessment/Plan: 1 Day Post-Op Procedure(s) (LRB): RIGHT TOTAL KNEE REPLACEMENT (Right) Advance diet Up with therapy D/C IV fluids Plan for discharge tomorrow home with hhpt WBAT RLE ABLA-mild and stable Dressing change to aquacel tomorrow prior to d/c    Aundra Dubin 07/17/2018, 8:20 AM

## 2018-07-17 NOTE — Progress Notes (Signed)
Physical Therapy Treatment Patient Details Name: [name] MRN: [id number] DOB: 19-Nov-1966 Today's Date: 07/17/2018    History of Present Illness Pt is a 52 y.o. male s/p R TKA.     PT Comments    Pt up in recliner and agreeable to participation in therapy. He ambulated 250 feet with RW min guard assist and performed LE exercises supine in bed. Plan is for possible d/c home tomorrow. Stair training to be addressed next session.    Follow Up Recommendations  Follow surgeon's recommendation for DC plan and follow-up therapies;Home health PT     Equipment Recommendations  Rolling walker with 5" wheels;3in1 (PT)    Recommendations for Other Services       Precautions / Restrictions Precautions Precautions: Knee Required Braces or Orthoses: Knee Immobilizer - Right Knee Immobilizer - Right: On at all times(except when in PT) Restrictions RLE Weight Bearing: Weight bearing as tolerated    Mobility  Bed Mobility   Bed Mobility: Sit to Supine       Sit to supine: Min guard   General bed mobility comments: min guard for safety  Transfers Overall transfer level: Needs assistance Equipment used: Rolling walker (2 wheeled) Transfers: Sit to/from Stand Sit to Stand: Min guard         General transfer comment: Pt demo good technique.  Ambulation/Gait   Gait Distance (Feet): 250 Feet Assistive device: Rolling walker (2 wheeled) Gait Pattern/deviations: Step-to pattern;Antalgic;Decreased weight shift to right Gait velocity: decreased Gait velocity interpretation: <1.31 ft/sec, indicative of household ambulator General Gait Details: steady gait, cues for RW management as pt tends to get too close to the front   Stairs             Wheelchair Mobility    Modified Rankin (Stroke Patients Only)       Balance                                            Cognition Arousal/Alertness: Awake/alert Behavior During Therapy: WFL for tasks  assessed/performed Overall Cognitive Status: Within Functional Limits for tasks assessed                                        Exercises Total Joint Exercises Ankle Circles/Pumps: AROM;Both;10 reps Quad Sets: AROM;Right;10 reps Heel Slides: AROM;Right;10 reps Hip ABduction/ADduction: AROM;Right;10 reps Straight Leg Raises: AROM;Right;5 reps    General Comments        Pertinent Vitals/Pain Pain Score: 3  Pain Location: R knee Pain Descriptors / Indicators: Sore Pain Intervention(s): Monitored during session;Repositioned;Premedicated before session    Home Living                      Prior Function            PT Goals (current goals can now be found in the care plan section) Acute Rehab PT Goals Patient Stated Goal: home PT Goal Formulation: With patient Time For Goal Achievement: 07/31/18 Potential to Achieve Goals: Good Progress towards PT goals: Progressing toward goals    Frequency    7X/week      PT Plan Current plan remains appropriate    Co-evaluation              AM-PAC PT "6 Clicks" Daily  Activity  Outcome Measure  Difficulty turning over in bed (including adjusting bedclothes, sheets and blankets)?: A Little Difficulty moving from lying on back to sitting on the side of the bed? : A Little Difficulty sitting down on and standing up from a chair with arms (e.g., wheelchair, bedside commode, etc,.)?: A Little Help needed moving to and from a bed to chair (including a wheelchair)?: None Help needed walking in hospital room?: None Help needed climbing 3-5 steps with a railing? : A Little 6 Click Score: 20    End of Session Equipment Utilized During Treatment: Gait belt;Right knee immobilizer Activity Tolerance: Patient tolerated treatment well Patient left: in bed;with call bell/phone within reach Nurse Communication: Mobility status PT Visit Diagnosis: Other abnormalities of gait and mobility (R26.89);Pain Pain -  Right/Left: Right Pain - part of body: Knee     Time: 4098-1191 PT Time Calculation (min) (ACUTE ONLY): 24 min  Charges:  $Gait Training: 8-22 mins $Therapeutic Exercise: 8-22 mins                    G Codes:       Lorrin Goodell, PT  Office # [phone] Pager [phone]    Lorriane Shire 07/17/2018, 3:29 PM

## 2018-07-17 NOTE — Evaluation (Signed)
Physical Therapy Evaluation Patient Details Name: [name] MRN: [id number] DOB: July 06, 1966 Today's Date: 07/17/2018   History of Present Illness  Pt is a 52 y.o. male s/p R TKA.   Clinical Impression  Pt is s/p TKA resulting in the deficits listed below (see PT Problem List). On eval, pt required min guard assist for bed mobility, transfers and gait 220 feet with RW. Pt will benefit from skilled PT to increase their independence and safety with mobility to allow discharge to the venue listed below.      Follow Up Recommendations Follow surgeon's recommendation for DC plan and follow-up therapies;Home health PT    Equipment Recommendations  Rolling walker with 5" wheels;3in1 (PT)    Recommendations for Other Services       Precautions / Restrictions Precautions Precautions: Knee Required Braces or Orthoses: Knee Immobilizer - Right Knee Immobilizer - Right: On at all times(except when in PT) Restrictions RLE Weight Bearing: Weight bearing as tolerated      Mobility  Bed Mobility Overal bed mobility: Needs Assistance Bed Mobility: Supine to Sit     Supine to sit: HOB elevated;Min guard     General bed mobility comments: cues for sequencing, +rail  Transfers Overall transfer level: Needs assistance Equipment used: Rolling walker (2 wheeled) Transfers: Sit to/from Stand Sit to Stand: Min guard         General transfer comment: verbal cues for hand placement  Ambulation/Gait Ambulation/Gait assistance: Min guard Gait Distance (Feet): 220 Feet Assistive device: Rolling walker (2 wheeled) Gait Pattern/deviations: Step-to pattern;Antalgic;Decreased weight shift to right Gait velocity: decreased Gait velocity interpretation: <1.31 ft/sec, indicative of household ambulator General Gait Details: steady Development worker, international aid    Modified Rankin (Stroke Patients Only)       Balance Overall balance assessment: Mild deficits  observed, not formally tested                                           Pertinent Vitals/Pain Pain Assessment: 0-10 Pain Score: 3  Pain Location: R knee Pain Descriptors / Indicators: Sore Pain Intervention(s): Monitored during session;Repositioned    Home Living Family/patient expects to be discharged to:: Private residence Living Arrangements: Spouse/significant other Available Help at Discharge: Family;Available 24 hours/day Type of Home: House Home Access: Stairs to enter Entrance Stairs-Rails: None Entrance Stairs-Number of Steps: 1 Home Layout: One level Home Equipment: None      Prior Function Level of Independence: Independent               Hand Dominance   Dominant Hand: Right    Extremity/Trunk Assessment   Upper Extremity Assessment Upper Extremity Assessment: Overall WFL for tasks assessed    Lower Extremity Assessment Lower Extremity Assessment: RLE deficits/detail RLE Deficits / Details: +SLR, AROM R knee 0-75    Cervical / Trunk Assessment Cervical / Trunk Assessment: Normal  Communication   Communication: No difficulties  Cognition Arousal/Alertness: Awake/alert Behavior During Therapy: WFL for tasks assessed/performed Overall Cognitive Status: Within Functional Limits for tasks assessed                                        General Comments      Exercises Total Joint Exercises  Ankle Circles/Pumps: AROM;Both;10 reps Quad Sets: AROM;Right;10 reps Heel Slides: AROM;Right;10 reps   Assessment/Plan    PT Assessment Patient needs continued PT services  PT Problem List Decreased strength;Decreased range of motion;Decreased mobility;Decreased activity tolerance;Decreased balance;Pain;Decreased knowledge of use of DME       PT Treatment Interventions DME instruction;Therapeutic activities;Gait training;Therapeutic exercise;Patient/family education;Stair training;Balance training;Functional mobility  training    PT Goals (Current goals can be found in the Care Plan section)  Acute Rehab PT Goals Patient Stated Goal: home PT Goal Formulation: With patient Time For Goal Achievement: 07/31/18 Potential to Achieve Goals: Good    Frequency 7X/week   Barriers to discharge        Co-evaluation               AM-PAC PT "6 Clicks" Daily Activity  Outcome Measure Difficulty turning over in bed (including adjusting bedclothes, sheets and blankets)?: A Little Difficulty moving from lying on back to sitting on the side of the bed? : A Little Difficulty sitting down on and standing up from a chair with arms (e.g., wheelchair, bedside commode, etc,.)?: A Little Help needed moving to and from a bed to chair (including a wheelchair)?: A Little Help needed walking in hospital room?: A Little Help needed climbing 3-5 steps with a railing? : A Little 6 Click Score: 18    End of Session Equipment Utilized During Treatment: Gait belt;Right knee immobilizer Activity Tolerance: Patient tolerated treatment well Patient left: in chair;with call bell/phone within reach Nurse Communication: Mobility status PT Visit Diagnosis: Other abnormalities of gait and mobility (R26.89);Pain Pain - Right/Left: Right Pain - part of body: Knee    Time: 5427-0623 PT Time Calculation (min) (ACUTE ONLY): 25 min   Charges:   PT Evaluation $PT Eval Low Complexity: 1 Low PT Treatments $Gait Training: 8-22 mins   PT G Codes:        Lorrin Goodell, PT  Office # [phone] Pager [phone]   Lorriane Shire 07/17/2018, 10:47 AM

## 2018-07-18 LAB — CBC
HCT: 36.7 % — ABNORMAL LOW (ref 39.0–52.0)
Hemoglobin: 12.1 g/dL — ABNORMAL LOW (ref 13.0–17.0)
MCH: 29.2 pg (ref 26.0–34.0)
MCHC: 33 g/dL (ref 30.0–36.0)
MCV: 88.4 fL (ref 78.0–100.0)
PLATELETS: 260 10*3/uL (ref 150–400)
RBC: 4.15 MIL/uL — ABNORMAL LOW (ref 4.22–5.81)
RDW: 12.8 % (ref 11.5–15.5)
WBC: 13.1 10*3/uL — AB (ref 4.0–10.5)

## 2018-07-18 NOTE — Progress Notes (Signed)
   Subjective:  Patient reports pain as marked.  Having a rough day today.  Not sure if he is ready to go home today.  Objective:   VITALS:   Vitals:   07/17/18 1411 07/17/18 1956 07/17/18 2105 07/18/18 0516  BP: 125/71 (!) 143/80  124/78  Pulse: 89 88  93  Resp: 16 17  16   Temp: 99 F (37.2 C) 99.8 F (37.7 C) 99.8 F (37.7 C) 99.5 F (37.5 C)  TempSrc: Oral Oral  Oral  SpO2: 97% 99%  94%  Weight:      Height:        Neurologically intact Neurovascular intact Sensation intact distally Intact pulses distally Dorsiflexion/Plantar flexion intact Incision: dressing C/D/I and no drainage No cellulitis present Compartment soft   Lab Results  Component Value Date   WBC 13.1 (H) 07/18/2018   HGB 12.1 (L) 07/18/2018   HCT 36.7 (L) 07/18/2018   MCV 88.4 07/18/2018   PLT 260 07/18/2018     Assessment/Plan:  2 Days Post-Op   - Expected postop acute blood loss anemia - will monitor for symptoms - Up with PT/OT - DVT ppx - SCDs, ambulation, aspirin - WBAT operative extremity - Pain control - Discharge planning - home today vs tomorrow   Eduard Roux 07/18/2018, 10:44 AM [phone]

## 2018-07-18 NOTE — Discharge Summary (Signed)
Physician Discharge Summary      Patient ID: [name] MRN: [id number] DOB/AGE: [birth year] 52 y.o.  Admit date: 07/16/2018 Discharge date: 07/18/2018  Admission Diagnoses:  <principal problem not specified>  Discharge Diagnoses:  Active Problems:   Arthritis of right knee   Past Medical History:  Diagnosis Date  . Arthritis of knee, right   . GERD (gastroesophageal reflux disease)   . Hypertension     Surgeries: Procedure(s): RIGHT TOTAL KNEE REPLACEMENT on 07/16/2018   Consultants (if any):   Discharged Condition: Improved  Hospital Course: [name] is an 52 y.o. male who was admitted 07/16/2018 with a diagnosis of <principal problem not specified> and went to the operating room on 07/16/2018 and underwent the above named procedures.    He was given perioperative antibiotics:  Anti-infectives (From admission, onward)   Start     Dose/Rate Route Frequency Ordered Stop   07/16/18 1800  ceFAZolin (ANCEF) IVPB 1 g/50 mL premix     1 g 100 mL/hr over 30 Minutes Intravenous Every 8 hours 07/16/18 1618 07/17/18 0245   07/16/18 0802  ceFAZolin (ANCEF) 2-4 GM/100ML-% IVPB    Note to Pharmacy:  Barbie Haggis   : cabinet override      07/16/18 0802 07/16/18 1041   07/16/18 0800  ceFAZolin (ANCEF) IVPB 2g/100 mL premix     2 g 200 mL/hr over 30 Minutes Intravenous On call to O.R. 07/16/18 0757 07/16/18 1056    .  He was given sequential compression devices, early ambulation, and aspirin for DVT prophylaxis.  He benefited maximally from the hospital stay and there were no complications.    Recent vital signs:  Vitals:   07/17/18 2105 07/18/18 0516  BP:  124/78  Pulse:  93  Resp:  16  Temp: 99.8 F (37.7 C) 99.5 F (37.5 C)  SpO2:  94%    Recent laboratory studies:  Lab Results  Component Value Date   HGB 12.1 (L) 07/18/2018   HGB 12.8 (L) 07/17/2018   HGB 13.9 07/06/2018   Lab Results  Component Value Date   WBC 13.1 (H) 07/18/2018   PLT 260  07/18/2018   Lab Results  Component Value Date   INR 0.96 07/06/2018   Lab Results  Component Value Date   NA 133 (L) 07/17/2018   K 4.6 07/17/2018   CL 100 07/17/2018   CO2 26 07/17/2018   BUN 12 07/17/2018   CREATININE 0.85 07/17/2018   GLUCOSE 150 (H) 07/17/2018    Discharge Medications:   Allergies as of 07/18/2018   No Known Allergies     Medication List    STOP taking these medications   acetaminophen-codeine 300-30 MG tablet Commonly known as:  TYLENOL #3   meloxicam 7.5 MG tablet Commonly known as:  MOBIC   naproxen sodium 220 MG tablet Commonly known as:  ALEVE     TAKE these medications   amLODipine 10 MG tablet Commonly known as:  NORVASC Take 1 tablet (10 mg total) by mouth daily.   aspirin 325 MG EC tablet Take 1 tablet (325 mg total) by mouth daily with breakfast.   methocarbamol 500 MG tablet Commonly known as:  ROBAXIN Take 1 tablet (500 mg total) by mouth every 8 (eight) hours as needed for muscle spasms.   ondansetron 4 MG tablet Commonly known as:  ZOFRAN Take 1 tablet (4 mg total) by mouth every 6 (six) hours as needed for nausea.   oxyCODONE 5 MG immediate release  tablet Commonly known as:  Oxy IR/ROXICODONE Take 1-2 tabs po q4-6 hours prn pain   senna-docusate 8.6-50 MG tablet Commonly known as:  Senokot-S Take 1 tablet by mouth at bedtime as needed for mild constipation.       Diagnostic Studies: Dg Chest 2 View  Result Date: 07/07/2018 CLINICAL DATA:  Preop knee replacement EXAM: CHEST - 2 VIEW COMPARISON:  None. FINDINGS: There is no focal parenchymal opacity. There is no pleural effusion or pneumothorax. There is mild stable cardiomegaly. The osseous structures are unremarkable. IMPRESSION: No active cardiopulmonary disease. Electronically Signed   By: Kathreen Devoid   On: 07/07/2018 08:23   Xr Knee 3 View Right  Result Date: 06/25/2018 X-ray right knee AP lateral and sunrise views obtained show moderate to severe degenerative  changes.  Near bone-on-bone medial compartment.  Some periarticular spurs.  Medial compartment subchondral cystic changes.  Large loose body posterior knee.  Large posterior loose body was seen on previous films June 2018.  Today's x-rays have showed significant progression from previous films June 2018.  No acute findings.   Disposition: Discharge disposition: 01-Home or Self Care       Discharge Instructions    Call MD / Call 911   Complete by:  As directed    If you experience chest pain or shortness of breath, CALL 911 and be transported to the hospital emergency room.  If you develope a fever above 101.5 F, pus (white drainage) or increased drainage or redness at the wound, or calf pain, call your surgeon's office.   Constipation Prevention   Complete by:  As directed    Drink plenty of fluids.  Prune juice may be helpful.  You may use a stool softener, such as Colace (over the counter) 100 mg twice a day.  Use MiraLax (over the counter) for constipation as needed.   Driving restrictions   Complete by:  As directed    No driving while taking narcotic pain meds.   Increase activity slowly as tolerated   Complete by:  As directed          Signed: Eduard Roux 07/18/2018, 9:31 AM

## 2018-07-18 NOTE — Progress Notes (Signed)
Physical Therapy Treatment Patient Details Name: [name] MRN: [id number] DOB: [birth year] Today's Date: 07/18/2018    History of Present Illness Pt is a 52 y.o. male s/p R TKA.     PT Comments    Session significantly limited by pain. Pt only able to complete ankle pumps and quad sets from HEP. He required increased assist for bed mobility and transfers and only tolerated ambulating 10 feet with RW, bed to recliner. Attempted stair training without success as pt unable to WB through RLE to step up with LLE. Pt positioned in recliner with feet elevated at end of session. Of note, on arrival pt had ice pack inside knee immobilizer which had completely leaked out soaking KI and dressing. RN aware. KI left off in recliner to dry.    Follow Up Recommendations  Follow surgeon's recommendation for DC plan and follow-up therapies;Home health PT     Equipment Recommendations  Rolling walker with 5" wheels;3in1 (PT)    Recommendations for Other Services       Precautions / Restrictions Precautions Precautions: Knee Precaution Comments: educated on no pillow under knee Required Braces or Orthoses: Knee Immobilizer - Right Knee Immobilizer - Right: On at all times(except in PT) Restrictions RLE Weight Bearing: Weight bearing as tolerated    Mobility  Bed Mobility Overal bed mobility: Needs Assistance Bed Mobility: Supine to Sit     Supine to sit: HOB elevated;Min assist     General bed mobility comments: +rail, assist with RLE, cues for sequencing  Transfers Overall transfer level: Needs assistance Equipment used: Rolling walker (2 wheeled) Transfers: Sit to/from Stand Sit to Stand: Min assist         General transfer comment: cues for hand placement, increased time and effort  Ambulation/Gait Ambulation/Gait assistance: Min guard Gait Distance (Feet): 10 Feet Assistive device: Rolling walker (2 wheeled) Gait Pattern/deviations: Step-to pattern;Antalgic;Decreased  weight shift to right Gait velocity: decreased Gait velocity interpretation: <1.8 ft/sec, indicate of risk for recurrent falls General Gait Details: cues for sequencing, distance limited by pain   Stairs Stairs: (unable due to pain)           Wheelchair Mobility    Modified Rankin (Stroke Patients Only)       Balance                                            Cognition Arousal/Alertness: Awake/alert Behavior During Therapy: WFL for tasks assessed/performed Overall Cognitive Status: Within Functional Limits for tasks assessed                                        Exercises Total Joint Exercises Ankle Circles/Pumps: AROM;Both;10 reps Quad Sets: AROM;Right;10 reps    General Comments        Pertinent Vitals/Pain Pain Assessment: Faces Faces Pain Scale: Hurts whole lot Pain Location: R knee Pain Descriptors / Indicators: Grimacing;Guarding;Moaning Pain Intervention(s): Limited activity within patient's tolerance;Repositioned;RN gave pain meds during session    Home Living                      Prior Function            PT Goals (current goals can now be found in the care plan section) Acute Rehab PT Goals  Patient Stated Goal: home PT Goal Formulation: With patient Time For Goal Achievement: 07/31/18 Potential to Achieve Goals: Good Progress towards PT goals: Not progressing toward goals - comment(increased pain)    Frequency    7X/week      PT Plan Current plan remains appropriate    Co-evaluation              AM-PAC PT "6 Clicks" Daily Activity  Outcome Measure  Difficulty turning over in bed (including adjusting bedclothes, sheets and blankets)?: A Lot Difficulty moving from lying on back to sitting on the side of the bed? : A Lot Difficulty sitting down on and standing up from a chair with arms (e.g., wheelchair, bedside commode, etc,.)?: A Lot Help needed moving to and from a bed to chair  (including a wheelchair)?: A Little Help needed walking in hospital room?: A Little Help needed climbing 3-5 steps with a railing? : A Lot 6 Click Score: 14    End of Session Equipment Utilized During Treatment: Gait belt Activity Tolerance: Patient limited by pain Patient left: in chair;with call bell/phone within reach Nurse Communication: Mobility status;Patient requests pain meds PT Visit Diagnosis: Other abnormalities of gait and mobility (R26.89);Pain Pain - Right/Left: Right Pain - part of body: Knee     Time: 0821-0846 PT Time Calculation (min) (ACUTE ONLY): 25 min  Charges:  $Gait Training: 8-22 mins $Therapeutic Activity: 8-22 mins                    G Codes:       Lorrin Goodell, PT  Office # [phone] Pager [phone]    Lorriane Shire 07/18/2018, 8:51 AM

## 2018-07-18 NOTE — Care Management Note (Signed)
Case Management Note  Patient Details  Name: [name] MRN: [id number] Date of Birth: October 14, 1966  Subjective/Objective:                 RW and 3/1 to be delivered to room prior to DC, per notes patient will follow HEP. No further CM needs.   Action/Plan:   Expected Discharge Date:  07/18/18               Expected Discharge Plan:  Home/Self Care  In-House Referral:     Discharge planning Services  CM Consult  Post Acute Care Choice:    Choice offered to:     DME Arranged:  3-N-1, Walker rolling DME Agency:  [place]:    [facility] Agency:     Status of Service:  Completed, signed off  If discussed at [place] of Stay Meetings, dates discussed:    Additional Comments:  Carles Collet, RN 07/18/2018, 10:19 AM

## 2018-07-18 NOTE — Progress Notes (Signed)
Physical Therapy Treatment Patient Details Name: [name] MRN: [id number] DOB: 1966/05/19 Today's Date: 07/18/2018    History of Present Illness Pt is a 52 y.o. male s/p R TKA.     PT Comments    Pt feeling better this PM with decreased pain and increased ability to participate in therapy. He ambulated 200 feet min guard assist with RW and completed LE HEP in recliner. Stair training to be completed next session. He has one step to enter his house. Tentative plan is for d/c home tomorrow.    Follow Up Recommendations  Follow surgeon's recommendation for DC plan and follow-up therapies;Home health PT     Equipment Recommendations  Rolling walker with 5" wheels;3in1 (PT)    Recommendations for Other Services       Precautions / Restrictions Precautions Precautions: Knee Precaution Comments: reviewed no pillow under knee Required Braces or Orthoses: Knee Immobilizer - Right Knee Immobilizer - Right: On at all times(except in PT) Restrictions RLE Weight Bearing: Weight bearing as tolerated    Mobility  Bed Mobility               General bed mobility comments: Pt received in recliner and returned to recliner after ambulation.  Transfers Overall transfer level: Needs assistance Equipment used: Rolling walker (2 wheeled) Transfers: Sit to/from Stand Sit to Stand: Min guard         General transfer comment: increased time and effort  Ambulation/Gait Ambulation/Gait assistance: Min guard Gait Distance (Feet): 200 Feet Assistive device: Rolling walker (2 wheeled) Gait Pattern/deviations: Step-to pattern;Decreased stride length;Antalgic;Decreased weight shift to right Gait velocity: decreased Gait velocity interpretation: <1.31 ft/sec, indicative of household ambulator General Gait Details: slow, steady gait   Stairs             Wheelchair Mobility    Modified Rankin (Stroke Patients Only)       Balance                                             Cognition Arousal/Alertness: Awake/alert Behavior During Therapy: WFL for tasks assessed/performed Overall Cognitive Status: Within Functional Limits for tasks assessed                                        Exercises Total Joint Exercises Ankle Circles/Pumps: AROM;Both;10 reps Quad Sets: AROM;Right;10 reps Short Arc QuadSinclair Ship;Right;10 reps Heel Slides: AAROM;Right;10 reps Straight Leg Raises: AAROM;Right;10 reps Goniometric ROM: 0-75 R knee    General Comments        Pertinent Vitals/Pain Pain Score: 5  Pain Location: R knee Pain Descriptors / Indicators: Sore;Grimacing;Guarding Pain Intervention(s): Monitored during session;Premedicated before session    Home Living                      Prior Function            PT Goals (current goals can now be found in the care plan section) Acute Rehab PT Goals Patient Stated Goal: home PT Goal Formulation: With patient Time For Goal Achievement: 07/31/18 Potential to Achieve Goals: Good Progress towards PT goals: Progressing toward goals    Frequency    7X/week      PT Plan Current plan remains appropriate    Co-evaluation  AM-PAC PT "6 Clicks" Daily Activity  Outcome Measure  Difficulty turning over in bed (including adjusting bedclothes, sheets and blankets)?: A Lot Difficulty moving from lying on back to sitting on the side of the bed? : A Lot Difficulty sitting down on and standing up from a chair with arms (e.g., wheelchair, bedside commode, etc,.)?: A Little Help needed moving to and from a bed to chair (including a wheelchair)?: A Little Help needed walking in hospital room?: A Little Help needed climbing 3-5 steps with a railing? : A Lot 6 Click Score: 15    End of Session Equipment Utilized During Treatment: Gait belt;Right knee immobilizer(KI in place at end of session) Activity Tolerance: Patient tolerated treatment well Patient  left: in chair;with call bell/phone within reach;with family/visitor present Nurse Communication: Mobility status PT Visit Diagnosis: Other abnormalities of gait and mobility (R26.89);Pain Pain - Right/Left: Right Pain - part of body: Knee     Time: 1357-1420 PT Time Calculation (min) (ACUTE ONLY): 23 min  Charges:  $Gait Training: 8-22 mins $Therapeutic Exercise: 8-22 mins                    G Codes:       Lorrin Goodell, PT  Office # [phone] Pager [phone]    Lorriane Shire 07/18/2018, 3:11 PM

## 2018-07-19 ENCOUNTER — Encounter (HOSPITAL_COMMUNITY): Payer: Self-pay | Admitting: Orthopaedic Surgery

## 2018-07-19 LAB — CBC
HCT: 37 % — ABNORMAL LOW (ref 39.0–52.0)
Hemoglobin: 12 g/dL — ABNORMAL LOW (ref 13.0–17.0)
MCH: 28.9 pg (ref 26.0–34.0)
MCHC: 32.4 g/dL (ref 30.0–36.0)
MCV: 89.2 fL (ref 78.0–100.0)
Platelets: 272 10*3/uL (ref 150–400)
RBC: 4.15 MIL/uL — ABNORMAL LOW (ref 4.22–5.81)
RDW: 12.7 % (ref 11.5–15.5)
WBC: 13.4 10*3/uL — ABNORMAL HIGH (ref 4.0–10.5)

## 2018-07-19 NOTE — Progress Notes (Signed)
Physical Therapy Treatment Patient Details Name: [name] MRN: [id number] DOB: [birth year] Today's Date: 07/19/2018    History of Present Illness Pt is a 52 y.o. male s/p R TKA.     PT Comments    Pt performed gait training and functional mobility this am with increased pain noted.  Pt is slow and guarded due to pain and required increased time to perform lower body dressing.  Informed supervising PT to place OT order for d/c to review adaptive equipment.  Pt tolerated stair training and hand out issued for clarity.  Pt too painful to review HEP.  Will f/u in pm for review of HEP.  Pt reports his ride will be here around 2pm.      Follow Up Recommendations  Follow surgeon's recommendation for DC plan and follow-up therapies;Home health PT     Equipment Recommendations  Rolling walker with 5" wheels;3in1 (PT)    Recommendations for Other Services       Precautions / Restrictions Precautions Precautions: Knee Precaution Comments: reviewed no pillow under knee Required Braces or Orthoses: Knee Immobilizer - Right Knee Immobilizer - Right: On at all times(except in PT) Restrictions Weight Bearing Restrictions: Yes RLE Weight Bearing: Weight bearing as tolerated    Mobility  Bed Mobility Overal bed mobility: Needs Assistance Bed Mobility: Supine to Sit     Supine to sit: HOB elevated;Supervision Sit to supine: Min guard   General bed mobility comments: Pt performed without assistance but required increased time and cues for technique to improve ease.    Transfers Overall transfer level: Needs assistance Equipment used: Rolling walker (2 wheeled) Transfers: Sit to/from Stand Sit to Stand: Min guard         General transfer comment: Pt is slow and guarded due to pain requiring significant time and effort.  Rocking momemtum used to achieve standing with one failed attempt before reaching success.    Ambulation/Gait Ambulation/Gait assistance: Min guard Gait  Distance (Feet): 220 Feet Assistive device: Rolling walker (2 wheeled) Gait Pattern/deviations: Step-to pattern;Decreased stride length;Antalgic;Decreased weight shift to right;Step-through pattern Gait velocity: decreased   General Gait Details: Pt with slow antalgic pattern with cues for weight shifting R and increasing stride on left.  Pt presents with step to pattern 80% of the time with step through around 20% with uneven steppage.     Stairs Stairs: Yes Stairs assistance: Min assist Stair Management: No rails;Backwards;With walker Number of Stairs: 2 General stair comments: Cues for sequencing and RW placement.  Pt required assistance to brace RW when negotiating the second stair.  Handout issued for clarity.  Pt reports he feels comfortable showing technique to his wife.     Wheelchair Mobility    Modified Rankin (Stroke Patients Only)       Balance Overall balance assessment: Mild deficits observed, not formally tested                                          Cognition Arousal/Alertness: Awake/alert Behavior During Therapy: WFL for tasks assessed/performed Overall Cognitive Status: Within Functional Limits for tasks assessed                                        Exercises      General Comments  Pertinent Vitals/Pain Pain Assessment: 0-10 Pain Score: 5  Pain Location: R knee Pain Descriptors / Indicators: Sore;Grimacing;Guarding Pain Intervention(s): Monitored during session;Repositioned;Ice applied;Patient requesting pain meds-RN notified;RN gave pain meds during session    Home Living                      Prior Function            PT Goals (current goals can now be found in the care plan section) Acute Rehab PT Goals Patient Stated Goal: home Potential to Achieve Goals: Good Progress towards PT goals: Progressing toward goals    Frequency    7X/week      PT Plan Current plan remains  appropriate    Co-evaluation              AM-PAC PT "6 Clicks" Daily Activity  Outcome Measure  Difficulty turning over in bed (including adjusting bedclothes, sheets and blankets)?: A Lot Difficulty moving from lying on back to sitting on the side of the bed? : A Lot Difficulty sitting down on and standing up from a chair with arms (e.g., wheelchair, bedside commode, etc,.)?: A Little Help needed moving to and from a bed to chair (including a wheelchair)?: A Little Help needed walking in hospital room?: A Little Help needed climbing 3-5 steps with a railing? : A Little 6 Click Score: 16    End of Session Equipment Utilized During Treatment: Gait belt Activity Tolerance: Patient tolerated treatment well Patient left: in chair;with call bell/phone within reach Nurse Communication: Mobility status PT Visit Diagnosis: Other abnormalities of gait and mobility (R26.89);Pain Pain - Right/Left: Right Pain - part of body: Knee     Time: 8295-6213 PT Time Calculation (min) (ACUTE ONLY): 33 min  Charges:  $Gait Training: 8-22 mins $Therapeutic Activity: 8-22 mins                    G Codes:       [name], PTA pager [phone]    Cristela Blue 07/19/2018, 11:55 AM

## 2018-07-19 NOTE — Progress Notes (Signed)
Physical Therapy Treatment Patient Details Name: [name] MRN: [id number] DOB: 05/07/1966 Today's Date: 07/19/2018    History of Present Illness Pt is a 52 y.o. male s/p R TKA.     PT Comments    PTA returned this pm to review and complete HEP with patient.  Pt presents with decreased pain and decreased ROM, as exercise progressed ROM improved within session.  Plan for return home with spouse this pm.      Follow Up Recommendations  Follow surgeon's recommendation for DC plan and follow-up therapies;Home health PT     Equipment Recommendations  Rolling walker with 5" wheels;3in1 (PT)    Recommendations for Other Services       Precautions / Restrictions Precautions Precautions: Knee Precaution Comments: reviewed no pillow under knee Required Braces or Orthoses: Knee Immobilizer - Right Knee Immobilizer - Right: On at all times(excpet in PT) Restrictions Weight Bearing Restrictions: Yes RLE Weight Bearing: Weight bearing as tolerated    Mobility  Bed Mobility General bed mobility comments: pt received in chair  Transfers          General transfer comment: defferred OOB mobility and focused on review of HEP this pm.    Ambulation/Gait   Stairs   Wheelchair Mobility    Modified Rankin (Stroke Patients Only)       Balance                                           Cognition Arousal/Alertness: Awake/alert Behavior During Therapy: WFL for tasks assessed/performed Overall Cognitive Status: Within Functional Limits for tasks assessed                                        Exercises Total Joint Exercises Ankle Circles/Pumps: AROM;Both;Supine;20 reps Quad Sets: AROM;Right;10 reps;Supine Towel Squeeze: AROM;Both;10 reps;Supine Short Arc Quad: AROM;Right;10 reps;Supine Heel Slides: Right;10 reps;Supine;AAROM Hip ABduction/ADduction: AROM;Right;10 reps;Supine Straight Leg Raises: AAROM;10 reps;Right;Supine Long  Arc Quad: AROM;Right;10 reps;Supine Knee Flexion: AROM;AAROM;Right;20 reps;Supine Goniometric ROM: 0-55 degrees R knee.      General Comments        Pertinent Vitals/Pain Pain Assessment: 0-10 Pain Score: 3  Faces Pain Scale: Hurts whole lot Pain Location: R knee Pain Descriptors / Indicators: Sore;Grimacing;Guarding Pain Intervention(s): Monitored during session;Repositioned    Home Living Family/patient expects to be discharged to:: Private residence Living Arrangements: Spouse/significant other Available Help at Discharge: Family;Available 24 hours/day Type of Home: House Home Access: Stairs to enter Entrance Stairs-Rails: None Home Layout: One level Home Equipment: None Additional Comments: 3 in 1 and RW have been delivered to room    Prior Function Level of Independence: Independent          PT Goals (current goals can now be found in the care plan section) Acute Rehab PT Goals Patient Stated Goal: home Potential to Achieve Goals: Good Progress towards PT goals: Progressing toward goals    Frequency    7X/week      PT Plan Current plan remains appropriate    Co-evaluation              AM-PAC PT "6 Clicks" Daily Activity  Outcome Measure  Difficulty turning over in bed (including adjusting bedclothes, sheets and blankets)?: A Lot Difficulty moving from lying on back to sitting on the  side of the bed? : A Lot Difficulty sitting down on and standing up from a chair with arms (e.g., wheelchair, bedside commode, etc,.)?: A Little Help needed moving to and from a bed to chair (including a wheelchair)?: A Little Help needed walking in hospital room?: A Little Help needed climbing 3-5 steps with a railing? : A Little 6 Click Score: 16    End of Session Equipment Utilized During Treatment: Gait belt Activity Tolerance: Patient tolerated treatment well Patient left: in chair;with call bell/phone within reach Nurse Communication: Mobility status PT  Visit Diagnosis: Other abnormalities of gait and mobility (R26.89);Pain Pain - Right/Left: Right Pain - part of body: Knee     Time: 9166-0600 PT Time Calculation (min) (ACUTE ONLY): 24 min  Charges:   $Therapeutic Exercise: 23-37 mins                     G Codes:       Governor Rooks, PTA pager [phone]    Cristela Blue 07/19/2018, 1:33 PM

## 2018-07-19 NOTE — Progress Notes (Signed)
Subjective: Patient doing well.  Good pain control right knee.  Has completed long-haul ambulation and stairs with therapy.  No voiding issues.  Ready to go home.  Objective: Vital signs in last 24 hours: Temp:  [98.2 F (36.8 C)-99.7 F (37.6 C)] 99.2 F (37.3 C) (07/22 0354) Pulse Rate:  [82-96] 91 (07/22 0354) Resp:  [16] 16 (07/22 0354) BP: (117-139)/(71-83) 139/83 (07/22 0935) SpO2:  [93 %-96 %] 96 % (07/22 0354)  Intake/Output from previous day: 07/21 0701 - 07/22 0700 In: 720 [P.O.:720] Out: 1550 [Urine:1550] Intake/Output this shift: No intake/output data recorded.  Recent Labs    07/17/18 0343 07/18/18 0553 07/19/18 0333  HGB 12.8* 12.1* 12.0*   Recent Labs    07/18/18 0553 07/19/18 0333  WBC 13.1* 13.4*  RBC 4.15* 4.15*  HCT 36.7* 37.0*  PLT 260 272   Recent Labs    07/17/18 0343  NA 133*  K 4.6  CL 100  CO2 26  BUN 12  CREATININE 0.85  GLUCOSE 150*  CALCIUM 8.9   No results for input(s): LABPT, INR in the last 72 hours.  Exam Extremely pleasant white male alert and oriented in no acute distress.  Right knee wound looks good.  Staples intact.  No drainage or signs of infection.  Calf nontender.  Neurovascular intact.      Assessment/Plan: We will discharge patient home today.  Prescription on chart for Percocet,  Robaxin and aspirin.  Follow-up with Dr. Lorin Mercy 2 weeks postop.  Call if there are any issues.[name] 07/19/2018, 10:29 AM

## 2018-07-19 NOTE — Evaluation (Signed)
Occupational Therapy Evaluation and Discharge Patient Details Name: [name] MRN: [id number] DOB: Nov 14, 1966 Today's Date: 07/19/2018    History of Present Illness Pt is a 52 y.o. male s/p R TKA.    Clinical Impression   All education completed as detailed below with pt verbalizing understanding. He has a supportive wife for assist as needed at home.No further OT needs    Follow Up Recommendations    No follow up OT   Equipment Recommendations  None recommended by OT    Recommendations for Other Services       Precautions / Restrictions Precautions Precautions: Knee Precaution Comments: reviewed no pillow under knee Required Braces or Orthoses: Knee Immobilizer - Right Knee Immobilizer - Right: On at all times(except in PT) Restrictions Weight Bearing Restrictions: Yes RLE Weight Bearing: Weight bearing as tolerated      Mobility Bed Mobility Overal bed mobility: Needs Assistance Bed Mobility: Supine to Sit     Supine to sit: HOB elevated;Supervision Sit to supine: Min guard   General bed mobility comments: pt received in chair  Transfers Overall transfer level: Needs assistance Equipment used: Rolling walker (2 wheeled) Transfers: Sit to/from Stand Sit to Stand: Supervision         General transfer comment: increased time, use of momentum, supervision for safety    Balance Overall balance assessment: Mild deficits observed, not formally tested                                         ADL either performed or assessed with clinical judgement   ADL Overall ADL's : Needs assistance/impaired Eating/Feeding: Independent;Sitting   Grooming: Wash/dry hands;Standing;Supervision/safety   Upper Body Bathing: Set up;Sitting   Lower Body Bathing: Minimal assistance;Sit to/from stand Lower Body Bathing Details (indicate cue type and reason): educated in use of long handled bath sponge and reacher Upper Body Dressing : Set up;Sitting    Lower Body Dressing: Minimal assistance;Sit to/from stand Lower Body Dressing Details (indicate cue type and reason): instructed in compensatory strategies, use of AE and safe footwear Toilet Transfer: Supervision/safety;Ambulation;RW;BSC(over toilet)   Toileting- Clothing Manipulation and Hygiene: Supervision/safety;Sit to/from Nurse, children's Details (indicate cue type and reason): educated and provided handout on how to use 3 in 1 as shower seat Functional mobility during ADLs: Supervision/safety;Rolling walker       Vision Patient Visual Report: No change from baseline       Perception     Praxis      Pertinent Vitals/Pain Pain Assessment: Faces Pain Score: 8  Faces Pain Scale: Hurts whole lot Pain Location: R knee Pain Descriptors / Indicators: Sore;Grimacing;Guarding Pain Intervention(s): Repositioned;Premedicated before session;Ice applied     Hand Dominance Right   Extremity/Trunk Assessment Upper Extremity Assessment Upper Extremity Assessment: Overall WFL for tasks assessed       Cervical / Trunk Assessment Cervical / Trunk Assessment: Normal   Communication Communication Communication: No difficulties   Cognition Arousal/Alertness: Awake/alert Behavior During Therapy: WFL for tasks assessed/performed Overall Cognitive Status: Within Functional Limits for tasks assessed                                     General Comments       Exercises     Shoulder Instructions      Home  Living Family/patient expects to be discharged to:: Private residence Living Arrangements: Spouse/significant other Available Help at Discharge: Family;Available 24 hours/day Type of Home: House Home Access: Stairs to enter [employer] of Steps: 1 Entrance Stairs-Rails: None Home Layout: One level     Bathroom Shower/Tub: Teacher, early years/pre: Standard     Home Equipment: None   Additional Comments: 3 in 1 and  RW have been delivered to room      Prior Functioning/Environment Level of Independence: Independent                 OT Problem List: Impaired balance (sitting and/or standing);Pain;Decreased knowledge of use of DME or AE      OT Treatment/Interventions:      OT Goals(Current goals can be found in the care plan section) Acute Rehab OT Goals Patient Stated Goal: home  OT Frequency:     Barriers to D/C:            Co-evaluation              AM-PAC PT "6 Clicks" Daily Activity     Outcome Measure Help from another person eating meals?: None Help from another person taking care of personal grooming?: A Little Help from another person toileting, which includes using toliet, bedpan, or urinal?: A Little Help from another person bathing (including washing, rinsing, drying)?: A Little Help from another person to put on and taking off regular upper body clothing?: None Help from another person to put on and taking off regular lower body clothing?: A Little 6 Click Score: 20   End of Session Equipment Utilized During Treatment: Gait belt;Rolling walker  Activity Tolerance: Patient tolerated treatment well Patient left: in chair;with call bell/phone within reach  OT Visit Diagnosis: Other abnormalities of gait and mobility (R26.89);Pain                Time: 0254-2706 OT Time Calculation (min): 18 min Charges:  OT General Charges $OT Visit: 1 Visit OT Evaluation $OT Eval Moderate Complexity: 1 Mod G-Codes:     Malka So 07/19/2018, 12:22 PM  07/19/2018 Nestor Lewandowsky, OTR/L Pager: [phone]

## 2018-07-19 NOTE — Progress Notes (Signed)
Written and verbal discharge instructions provided to the patient.  The patient and a family member verbalizes understanding instructions.

## 2018-07-19 NOTE — Discharge Instructions (Signed)
INSTRUCTIONS AFTER JOINT REPLACEMENT   o Remove items at home which could result in a fall. This includes throw rugs or furniture in walking pathways o ICE to the affected joint every three hours while awake for 30 minutes at a time, for at least the first 3-5 days, and then as needed for pain and swelling.  Continue to use ice for pain and swelling. You may notice swelling that will progress down to the foot and ankle.  This is normal after surgery.  Elevate your leg when you are not up walking on it.   o Continue to use the breathing machine you got in the hospital (incentive spirometer) which will help keep your temperature down.  It is common for your temperature to cycle up and down following surgery, especially at night when you are not up moving around and exerting yourself.  The breathing machine keeps your lungs expanded and your temperature down.   DIET:  As you were doing prior to hospitalization, we recommend a well-balanced diet.  DRESSING / WOUND CARE / SHOWERING  Okay to shower.  No tub soaking.  Do not apply any creams or ointments to incision.  Daily dressing changes with 4 x 4 gauze and can apply TED hose over this.  ACTIVITY  o Increase activity slowly as tolerated, but follow the weight bearing instructions below.   o No driving for 6 weeks or until further direction given by your physician.  You cannot drive while taking narcotics.  o No lifting or carrying greater than 10 lbs. until further directed by your surgeon. o Avoid periods of inactivity such as sitting longer than an hour when not asleep. This helps prevent blood clots.  o You may return to work once you are authorized by your doctor.     WEIGHT BEARING   Weight bearing as tolerated with assist device (walker, cane, etc) as directed, use it as long as suggested by your surgeon or therapist, typically at least 4-6 weeks.   EXERCISES  Results after joint replacement surgery are often greatly improved when  you follow the exercise, range of motion and muscle strengthening exercises prescribed by your doctor. Safety measures are also important to protect the joint from further injury. Any time any of these exercises cause you to have increased pain or swelling, decrease what you are doing until you are comfortable again and then slowly increase them. If you have problems or questions, call your caregiver or physical therapist for advice.   Rehabilitation is important following a joint replacement. After just a few days of immobilization, the muscles of the leg can become weakened and shrink (atrophy).  These exercises are designed to build up the tone and strength of the thigh and leg muscles and to improve motion. Often times heat used for twenty to thirty minutes before working out will loosen up your tissues and help with improving the range of motion but do not use heat for the first two weeks following surgery (sometimes heat can increase post-operative swelling).   These exercises can be done on a training (exercise) mat, on the floor, on a table or on a bed. Use whatever works the best and is most comfortable for you.    Use music or television while you are exercising so that the exercises are a pleasant break in your day. This will make your life better with the exercises acting as a break in your routine that you can look forward to.   Perform all  exercises about fifteen times, three times per day or as directed.  You should exercise both the operative leg and the other leg as well.  Exercises include:    Quad Sets - Tighten up the muscle on the front of the thigh (Quad) and hold for 5-10 seconds.    Straight Leg Raises - With your knee straight (if you were given a brace, keep it on), lift the leg to 60 degrees, hold for 3 seconds, and slowly lower the leg.  Perform this exercise against resistance later as your leg gets stronger.   Leg Slides: Lying on your back, slowly slide your foot toward  your buttocks, bending your knee up off the floor (only go as far as is comfortable). Then slowly slide your foot back down until your leg is flat on the floor again.   Angel Wings: Lying on your back spread your legs to the side as far apart as you can without causing discomfort.   Hamstring Strength:  Lying on your back, push your heel against the floor with your leg straight by tightening up the muscles of your buttocks.  Repeat, but this time bend your knee to a comfortable angle, and push your heel against the floor.  You may put a pillow under the heel to make it more comfortable if necessary.   A rehabilitation program following joint replacement surgery can speed recovery and prevent re-injury in the future due to weakened muscles. Contact your doctor or a physical therapist for more information on knee rehabilitation.    CONSTIPATION  Constipation is defined medically as fewer than three stools per week and severe constipation as less than one stool per week.  Even if you have a regular bowel pattern at home, your normal regimen is likely to be disrupted due to multiple reasons following surgery.  Combination of anesthesia, postoperative narcotics, change in appetite and fluid intake all can affect your bowels.   YOU MUST use at least one of the following options; they are listed in order of increasing strength to get the job done.  They are all available over the counter, and you may need to use some, POSSIBLY even all of these options:    Drink plenty of fluids (prune juice may be helpful) and high fiber foods Colace 100 mg by mouth twice a day  Senokot for constipation as directed and as needed Dulcolax (bisacodyl), take with full glass of water  Miralax (polyethylene glycol) once or twice a day as needed.  If you have tried all these things and are unable to have a bowel movement in the first 3-4 days after surgery call either your surgeon or your primary doctor.    If you  experience loose stools or diarrhea, hold the medications until you stool forms back up.  If your symptoms do not get better within 1 week or if they get worse, check with your doctor.  If you experience "the worst abdominal pain ever" or develop nausea or vomiting, please contact the office immediately for further recommendations for treatment.   ITCHING:  If you experience itching with your medications, try taking only a single pain pill, or even half a pain pill at a time.  You can also use Benadryl over the counter for itching or also to help with sleep.   TED HOSE STOCKINGS:  Use stockings on both legs until for at least 2 weeks or as directed by physician office. They may be removed at night for sleeping.  MEDICATIONS:  See your medication summary on the After Visit Summary that nursing will review with you.  You may have some home medications which will be placed on hold until you complete the course of blood thinner medication.  It is important for you to complete the blood thinner medication as prescribed.  PRECAUTIONS:  If you experience chest pain or shortness of breath - call 911 immediately for transfer to the hospital emergency department.   If you develop a fever greater that 101 F, purulent drainage from wound, increased redness or drainage from wound, foul odor from the wound/dressing, or calf pain - CONTACT YOUR SURGEON.                                                   FOLLOW-UP APPOINTMENTS:  If you do not already have a post-op appointment, please call the office for an appointment to be seen by your surgeon.  Guidelines for how soon to be seen are listed in your After Visit Summary, but are typically between 1-4 weeks after surgery.  OTHER INSTRUCTIONS:   Knee Replacement:  Do not place pillow under knee, focus on keeping the knee straight while resting. CPM instructions: 0-90 degrees, 2 hours in the morning, 2 hours in the afternoon, and 2 hours in the evening. Place foam  block, curve side up under heel at all times except when in CPM or when walking.  DO NOT modify, tear, cut, or change the foam block in any way.  MAKE SURE YOU:   Understand these instructions.   Get help right away if you are not doing well or get worse.    Thank you for letting us be a part of your medical care team.  It is a privilege we respect greatly.  We hope these instructions will help you stay on track for a fast and full recovery!

## 2018-07-20 ENCOUNTER — Encounter (INDEPENDENT_AMBULATORY_CARE_PROVIDER_SITE_OTHER): Payer: Self-pay | Admitting: Radiology

## 2018-07-21 ENCOUNTER — Telehealth (INDEPENDENT_AMBULATORY_CARE_PROVIDER_SITE_OTHER): Payer: Self-pay | Admitting: Orthopaedic Surgery

## 2018-07-21 NOTE — Telephone Encounter (Signed)
Patient called asked when will the (PT) start coming to his home to start physical therapy? The number to contact patient is [phone]

## 2018-07-21 NOTE — Telephone Encounter (Signed)
Sent email to [place] with [facility] to see how soon they would be able to start PT.

## 2018-07-22 NOTE — Telephone Encounter (Signed)
I left voicemail for patient to see if he had been contacted by [facility].  Asked for return call in the morning.

## 2018-07-22 NOTE — Telephone Encounter (Signed)
Left voicemail returning Jerry's call today to see how soon they would be able to start. Awaiting call.

## 2018-07-30 ENCOUNTER — Encounter (INDEPENDENT_AMBULATORY_CARE_PROVIDER_SITE_OTHER): Payer: Self-pay | Admitting: Orthopaedic Surgery

## 2018-07-30 ENCOUNTER — Ambulatory Visit (INDEPENDENT_AMBULATORY_CARE_PROVIDER_SITE_OTHER): Payer: 59 | Admitting: Orthopaedic Surgery

## 2018-07-30 ENCOUNTER — Ambulatory Visit (INDEPENDENT_AMBULATORY_CARE_PROVIDER_SITE_OTHER): Payer: Self-pay

## 2018-07-30 VITALS — BP 130/79 | HR 79 | Ht 70.0 in | Wt 248.0 lb

## 2018-07-30 DIAGNOSIS — Z96651 Presence of right artificial knee joint: Secondary | ICD-10-CM

## 2018-07-30 NOTE — Progress Notes (Signed)
   Office Visit Note   Patient: [name]           Date of Birth: 11-09-1966           MRN: [id number] Visit Date: 07/30/2018              Requested by: Dorena Dew, FNP [address], [place] [address] PCP: Lanae Boast, FNP   Assessment & Plan: Visit Diagnoses:  1. Status post total knee replacement, right     Plan: Transition to outpatient therapy.  He will continue work on knee flexion quad strengthening and full extension.  Recheck 4 weeks.  Follow-Up Instructions: Return in about 1 month (around 08/27/2018).   Orders:  Orders Placed This Encounter  Procedures  . XR Knee 1-2 Views Right   No orders of the defined types were placed in this encounter.     Procedures: No procedures performed   Clinical Data: No additional findings.   Subjective: Chief Complaint  Patient presents with  . Right Knee - Routine Post Op    07/16/18 Right TKA    HPI post total knee arthroplasty, right.  Home therapy ends next week we will transition to outpatient therapy.  Prescription for Norco given 40 tablets for pain.  He has 10 tablets left of the oxycodone.  Review of Systems unchanged from surgery.   Objective: Vital Signs: BP 130/79   Pulse 79   Ht 5\' 10"  (1.778 m)   Wt 248 lb (112.5 kg)   BMI 35.58 kg/m   Physical Exam general physical exam unchanged.  Ortho Exam midline knee incision looks good there is slight less swelling than expected postop.  Steri-Strips applied.  Calf is soft.  He has 5 degree to 7 degree extension lag and will continue to work on quad strengthening.  Flexion is 70 degrees today.  Ambulatory with a walker.  Specialty Comments:  No specialty comments available.  Imaging: Xr Knee 1-2 Views Right  Result Date: 07/30/2018 AP lateral standing view right knee obtained and reviewed.  This shows right total knee arthroplasty cemented without complications no loosening no subsidence. Impression: Satisfactory total knee  arthroplasty, right knee.    PMFS History: Patient Active Problem List   Diagnosis Date Noted  . Arthritis of right knee 07/16/2018  . Chronic pain of right knee 10/11/2017  . Prediabetes 01/02/2017  . Essential hypertension 09/04/2016   Past Medical History:  Diagnosis Date  . Arthritis of knee, right   . GERD (gastroesophageal reflux disease)   . Hypertension     Family History  Problem Relation Age of Onset  . Cancer Father        prostate   . Cancer Maternal Grandfather        prostate    Past Surgical History:  Procedure Laterality Date  . CHOLECYSTECTOMY    . TOTAL KNEE ARTHROPLASTY Right 07/16/2018  . TOTAL KNEE ARTHROPLASTY Right 07/16/2018   Procedure: RIGHT TOTAL KNEE REPLACEMENT;  Surgeon: Marybelle Killings, MD;  Location: [place];  Service: Orthopedics;  Laterality: Right;   Social History   Occupational History  . Not on file  Tobacco Use  . Smoking status: Former Research scientist (life sciences)  . Smokeless tobacco: Never Used  Substance and Sexual Activity  . Alcohol use: No  . Drug use: No  . Sexual activity: Yes

## 2018-07-30 NOTE — Addendum Note (Signed)
Addended by: Meyer Cory on: 07/30/2018 02:24 PM   Modules accepted: Orders

## 2018-07-30 NOTE — Telephone Encounter (Signed)
Patient is coming in for appointment this afternoon. Will discuss at appt.

## 2018-08-02 MED FILL — AMLODIPINE BESYLATE 10 MG T: 10 | 30 days supply | Qty: 30 | Fill #3

## 2018-08-09 ENCOUNTER — Encounter: Payer: Self-pay | Admitting: Physical Therapy

## 2018-08-09 ENCOUNTER — Ambulatory Visit: Payer: 59 | Attending: Orthopaedic Surgery | Admitting: Physical Therapy

## 2018-08-09 ENCOUNTER — Other Ambulatory Visit: Payer: Self-pay

## 2018-08-09 DIAGNOSIS — R2689 Other abnormalities of gait and mobility: Secondary | ICD-10-CM | POA: Diagnosis present

## 2018-08-09 DIAGNOSIS — M25661 Stiffness of right knee, not elsewhere classified: Secondary | ICD-10-CM | POA: Insufficient documentation

## 2018-08-09 DIAGNOSIS — M25562 Pain in left knee: Secondary | ICD-10-CM | POA: Insufficient documentation

## 2018-08-09 DIAGNOSIS — R600 Localized edema: Secondary | ICD-10-CM | POA: Insufficient documentation

## 2018-08-09 NOTE — Therapy (Signed)
[place], [place], [address] Phone: [phone]   Fax:  [phone]  Physical Therapy Evaluation  Patient Details  Name: [name] MRN: [id number] Date of Birth: March 02, 1966 Referring Provider: Marybelle Killings, MD   Encounter Date: 08/09/2018  PT End of Session - 08/09/18 1635    Visit Number  1    Number of Visits  17    Date for PT Re-Evaluation  10/04/18    PT Start Time  1630    PT Stop Time  1714    PT Time Calculation (min)  44 min    Activity Tolerance  Patient tolerated treatment well    Behavior During Therapy  [facility] for tasks assessed/performed       Past Medical History:  Diagnosis Date  . Arthritis of knee, right   . GERD (gastroesophageal reflux disease)   . Hypertension     Past Surgical History:  Procedure Laterality Date  . CHOLECYSTECTOMY    . TOTAL KNEE ARTHROPLASTY Right 07/16/2018  . TOTAL KNEE ARTHROPLASTY Right 07/16/2018   Procedure: RIGHT TOTAL KNEE REPLACEMENT;  Surgeon: Marybelle Killings, MD;  Location: [place];  Service: Orthopedics;  Laterality: Right;    There were no vitals filed for this visit.   Subjective Assessment - 08/09/18 1638    Subjective  Pt had a R TKA on July 19th, 2019. He had HHPT since his sugery. He feels that he has gotten better since surgery and is making progress. He reports slight numbness in R knee area that is constant.     Limitations  House hold activities;Sitting;Lifting;Standing;Walking    How long can you sit comfortably?  15 min    How long can you stand comfortably?  15 min   with SPC   How long can you walk comfortably?  10 min   with SPC or RW   Patient Stated Goals  get knee to 100% so pt can go back to work    Currently in Pain?  Yes    Pain Score  2     Pain Location  Knee    Pain Orientation  Right    Pain Descriptors / Indicators  Aching    Pain Type  Surgical pain    Pain Radiating Towards  Radiates into R thigh    Pain  Onset  1 to 4 weeks ago    Pain Frequency  Intermittent    Aggravating Factors   standing or walking for too long, changing positions in bed    Pain Relieving Factors  changing positions, ice, medication         [facility] PT Assessment - 08/09/18 0001      Assessment   Medical Diagnosis  R knee TKA    Referring Provider  Marybelle Killings, MD    Onset Date/Surgical Date  07/16/18    Hand Dominance  Right    Next MD Visit  --   MD is going to call pt in september   Prior Therapy  none      Restrictions   Other Position/Activity Restrictions  no driving      Balance Screen   Has the patient fallen in the past 6 months  No    Has the patient had a decrease in activity level because of a fear of falling?   No    Is the patient reluctant to leave their home because of a fear of falling?   No  Home Environment   Living Environment  Private residence    Living Arrangements  Spouse/significant other   grandson   Available Help at Discharge  Family    Type of [place] to enter    Entrance Stairs-Number of Steps  1    [place]  One level    [place] - 2 wheels;Cane - single point;Bedside commode;Grab bars - toilet;Grab bars - tub/shower      Prior Function   Level of Independence  Independent with basic ADLs;Needs assistance with ADLs    Dressing  Minimal   needs assistance getting R shoe on   Vocation  Full time employment    Vocation Requirements  standing    Leisure  throwing football with grandson, going to movies and going on dates with wife      Cognition   Overall Cognitive Status  Within Functional Limits for tasks assessed      Observation/Other Assessments   Focus on Therapeutic Outcomes (FOTO)   63% limited   predicted 39% limited     Observation/Other Assessments-Edema    Edema  Circumferential      Circumferential Edema   Circumferential - Right  over tibial tuberosity 40 cm; 10 cm  proximal to tibial tubosity 48 cm    Circumferential - Left   over tibial tuberosity- 36.5 cm; 10 cm proximal to tibial tubersity 44.3      ROM / Strength   AROM / PROM / Strength  AROM;PROM;Strength      AROM   AROM Assessment Site  Knee    Right/Left Knee  Right;Left    Right Knee Extension  8    Right Knee Flexion  75    Left Knee Extension  0    Left Knee Flexion  130      PROM   PROM Assessment Site  Knee    Right/Left Knee  Right;Left    Right Knee Extension  6    Right Knee Flexion  80      Strength   Strength Assessment Site  Knee    Right/Left Knee  Right;Left    Right Knee Flexion  4+/5   within available range   Right Knee Extension  4/5   within available range   Left Knee Flexion  5/5    Left Knee Extension  5/5      Ambulation/Gait   Assistive device  Straight cane    Gait Pattern  Step-through pattern;Decreased hip/knee flexion - right;Decreased stance time - right;Decreased weight shift to right;Trendelenburg;Antalgic                Objective measurements completed on examination: See above findings.              PT Education - 08/09/18 1719    Education Details  examination findings, POC, HEP, using RW outside home and cane inside home instead of vice versa    Person(s) Educated  Patient    Methods  Explanation;Handout    Comprehension  Verbalized understanding       PT Short Term Goals - 08/09/18 1727      PT SHORT TERM GOAL #1   Title  Pt will be I with HEP    Time  4    Period  Weeks    Target Date  09/06/18      PT SHORT TERM GOAL #2  Title  Pt will decrease R knee edema to 36.5cm over the tibial tuberosity and 44.3cm 10cm proximal to the tibial tuberosity to improve R knee ROM    Time  4    Period  Weeks    Status  New    Target Date  09/06/18        PT Long Term Goals - 08/09/18 1730      PT LONG TERM GOAL #1   Title  Pt will increase R knee flexion AROM to >/= 120 degrees and R knee extension AROM to </=2  degrees to improve function     Time  8    Period  Weeks    Status  New    Target Date  10/04/18      PT LONG TERM GOAL #2   Title  Pt will increase R knee strength to 5/5 to improve function and gait mechanics    Time  8    Period  Weeks    Status  New    Target Date  10/04/18      PT LONG TERM GOAL #3   Title  Pt will be able to stand with LRAD for >/= 60 min in order to perform work related duties per pt goal    Time  8    Period  Weeks    Status  New    Target Date  10/04/18      PT LONG TERM GOAL #4   Title  Pt will decrease FOTO score to </=39% limited to improve function and quality of life    Time  8    Period  Weeks    Status  New    Target Date  10/04/18             Plan - 08/09/18 1635    Clinical Impression Statement  Pt is a 52 yo male s/p R TKA on July 16, 2018. He has been doing HHPT since the surgery. He states he has minimal pain (2/10) upon arrival. Based on evaluation, pt presents with decreased R knee ROM and decreased R knee strength. Additionally, he has increased edema in the R knee. Pt ambulates with SPC and RW and demonstrates an antalgic gait pattern with decreased R knee and hip flexion. Pt would benefit from OPPT services to address pain, ROM, strength, edema, gait, and functional mobility.     History and Personal Factors relevant to plan of care:  HTN    Clinical Presentation  Stable    Clinical Presentation due to:  pain, decreased strength and ROM, TKA    Clinical Decision Making  Low    Rehab Potential  Good    PT Frequency  2x / week    PT Duration  8 weeks    PT Treatment/Interventions  ADLs/Self Care Home Management;Cryotherapy;Electrical Stimulation;Iontophoresis 4mg /ml Dexamethasone;Moist Heat;Ultrasound;DME Instruction;Gait training;Stair training;Functional mobility training;Therapeutic activities;Therapeutic exercise;Balance training;Neuromuscular re-education;Patient/family education;Manual techniques;Scar mobilization;Passive  range of motion;Dry needling;Energy conservation;Taping;Vasopneumatic Device    PT Next Visit Plan  knee ROM and strengthening as tolerated, gait training with SPC or RW, manual prn, GameReady    PT Home Exercise Plan  hamstring stretch, quad set, SAQs, heel slides    Consulted and Agree with Plan of Care  Patient       Patient will benefit from skilled therapeutic intervention in order to improve the following deficits and impairments:  Abnormal gait, Decreased knowledge of use of DME, Impaired sensation, Improper body mechanics, Pain, Decreased mobility, Postural  dysfunction, Decreased activity tolerance, Decreased endurance, Decreased range of motion, Decreased strength, Hypomobility, Decreased balance, Difficulty walking, Increased edema  Visit Diagnosis: Acute pain of left knee  Stiffness of right knee, not elsewhere classified  Other abnormalities of gait and mobility  Localized edema     Problem List Patient Active Problem List   Diagnosis Date Noted  . Arthritis of right knee 07/16/2018  . Chronic pain of right knee 10/11/2017  . Prediabetes 01/02/2017  . Essential hypertension 09/04/2016   Worthy Flank, SPT 08/09/18 5:36 PM   [facility] [address] [place], [place], [address] Phone: [phone]   Fax:  [phone]  Name: [name] MRN: [id number] Date of Birth: Dec 07, 1966

## 2018-08-11 ENCOUNTER — Ambulatory Visit: Payer: 59 | Admitting: Physical Therapy

## 2018-08-11 ENCOUNTER — Encounter: Payer: Self-pay | Admitting: Physical Therapy

## 2018-08-11 DIAGNOSIS — M25661 Stiffness of right knee, not elsewhere classified: Secondary | ICD-10-CM

## 2018-08-11 DIAGNOSIS — M25562 Pain in left knee: Secondary | ICD-10-CM | POA: Diagnosis not present

## 2018-08-11 DIAGNOSIS — R600 Localized edema: Secondary | ICD-10-CM

## 2018-08-11 DIAGNOSIS — R2689 Other abnormalities of gait and mobility: Secondary | ICD-10-CM

## 2018-08-11 NOTE — Therapy (Signed)
[place], [place], [address] Phone: [phone]   Fax:  [phone]  Physical Therapy Treatment  Patient Details  Name: [name] MRN: [id number] Date of Birth: 02-Jun-1966 Referring Provider: Marybelle Killings, MD   Encounter Date: 08/11/2018  PT End of Session - 08/11/18 1116    Visit Number  2    Number of Visits  17    Date for PT Re-Evaluation  10/04/18    PT Start Time  1102    PT Stop Time  1155    PT Time Calculation (min)  53 min       Past Medical History:  Diagnosis Date  . Arthritis of knee, right   . GERD (gastroesophageal reflux disease)   . Hypertension     Past Surgical History:  Procedure Laterality Date  . CHOLECYSTECTOMY    . TOTAL KNEE ARTHROPLASTY Right 07/16/2018  . TOTAL KNEE ARTHROPLASTY Right 07/16/2018   Procedure: RIGHT TOTAL KNEE REPLACEMENT;  Surgeon: Marybelle Killings, MD;  Location: [place];  Service: Orthopedics;  Laterality: Right;    There were no vitals filed for this visit.      [facility] PT Assessment - 08/11/18 0001      AROM   Right Knee Flexion  88   AAROM                  [facility] Adult PT Treatment/Exercise - 08/11/18 0001      Knee/Hip Exercises: Stretches   Active Hamstring Stretch  3 reps;30 seconds      Knee/Hip Exercises: Seated   Long Arc Quad  20 reps    Other Seated Knee/Hip Exercises  knee flexion and hold  and scoot as tolerated    unable to reach 90      Knee/Hip Exercises: Supine   Quad Sets  10 reps    Short Arc Quad Sets  20 reps    Heel Slides  AROM;AAROM;2 sets;10 reps    Heel Slides Limitations  second set with strap     Terminal Knee Extension  20 reps    Terminal Knee Extension Limitations  heel propped     Straight Leg Raises  10 reps      Modalities   Modalities  Vasopneumatic      Vasopneumatic   Number Minutes Vasopneumatic   15 minutes    Vasopnuematic Location   Knee    Vasopneumatic Pressure  Low    Vasopneumatic Temperature   32             PT Education - 08/11/18 1139    Education Details  HEP     Person(s) Educated  Patient    Methods  Explanation;Handout    Comprehension  Verbalized understanding       PT Short Term Goals - 08/09/18 1727      PT SHORT TERM GOAL #1   Title  Pt will be I with HEP    Time  4    Period  Weeks    Target Date  09/06/18      PT SHORT TERM GOAL #2   Title  Pt will decrease R knee edema to 36.5cm over the tibial tuberosity and 44.3cm 10cm proximal to the tibial tuberosity to improve R knee ROM    Time  4    Period  Weeks    Status  New    Target Date  09/06/18  PT Long Term Goals - 08/09/18 1730      PT LONG TERM GOAL #1   Title  Pt will increase R knee flexion AROM to >/= 120 degrees and R knee extension AROM to </=2 degrees to improve function     Time  8    Period  Weeks    Status  New    Target Date  10/04/18      PT LONG TERM GOAL #2   Title  Pt will increase R knee strength to 5/5 to improve function and gait mechanics    Time  8    Period  Weeks    Status  New    Target Date  10/04/18      PT LONG TERM GOAL #3   Title  Pt will be able to stand with LRAD for >/= 60 min in order to perform work related duties per pt goal    Time  8    Period  Weeks    Status  New    Target Date  10/04/18      PT LONG TERM GOAL #4   Title  Pt will decrease FOTO score to </=39% limited to improve function and quality of life    Time  8    Period  Weeks    Status  New    Target Date  10/04/18            Plan - 08/11/18 1144    Clinical Impression Statement  Pt reports 3/10 pain and doing well. Reviewed HEP and progressed with AAROM and stretching. Pt tolerated well. Began Vaso. Updated HEP. ROM impoving.     PT Next Visit Plan  knee ROM and strengthening as tolerated, gait training with SPC or RW, manual prn, GameReady    PT Home Exercise Plan  hamstring stretch, quad set, SAQs, heel slides, SLR, seated flexion  stretch    Consulted and Agree with Plan of Care  Patient       Patient will benefit from skilled therapeutic intervention in order to improve the following deficits and impairments:  Abnormal gait, Decreased knowledge of use of DME, Impaired sensation, Improper body mechanics, Pain, Decreased mobility, Postural dysfunction, Decreased activity tolerance, Decreased endurance, Decreased range of motion, Decreased strength, Hypomobility, Decreased balance, Difficulty walking, Increased edema  Visit Diagnosis: Acute pain of left knee  Stiffness of right knee, not elsewhere classified  Other abnormalities of gait and mobility  Localized edema     Problem List Patient Active Problem List   Diagnosis Date Noted  . Arthritis of right knee 07/16/2018  . Chronic pain of right knee 10/11/2017  . Prediabetes 01/02/2017  . Essential hypertension 09/04/2016    Dorene Ar, PTA 08/11/2018, 11:48 AM  [facility] [address] [place], [place], [address] Phone: [phone]   Fax:  [phone]  Name: [name] MRN: [id number] Date of Birth: [birth year]

## 2018-08-11 NOTE — Patient Instructions (Signed)
Straight Leg Raise    Tighten stomach and slowly raise locked right leg Repeat _10___ times per set. Do __2__ sets per session. Do __2__ sessions per day.  Chair Knee Flexion    Keeping feet on floor, slide foot of operated leg back, bending knee. Hold __10__ seconds. Repeat __3_ times. Do __2__ sessions a day. Scoot forward as able without moving foot.    http://gt2.exer.us/304   Copyright  VHI. All rights reserved.

## 2018-08-17 ENCOUNTER — Ambulatory Visit: Payer: 59 | Admitting: Physical Therapy

## 2018-08-17 DIAGNOSIS — M25562 Pain in left knee: Secondary | ICD-10-CM

## 2018-08-17 DIAGNOSIS — R600 Localized edema: Secondary | ICD-10-CM

## 2018-08-17 DIAGNOSIS — M25661 Stiffness of right knee, not elsewhere classified: Secondary | ICD-10-CM

## 2018-08-17 DIAGNOSIS — R2689 Other abnormalities of gait and mobility: Secondary | ICD-10-CM

## 2018-08-17 NOTE — Therapy (Signed)
[place], [place], [address] Phone: [phone]   Fax:  [phone]  Physical Therapy Treatment  Patient Details  Name: [name] MRN: [id number] Date of Birth: [birth year] Referring Provider: Marybelle Killings, MD   Encounter Date: 08/17/2018  PT End of Session - 08/17/18 0838    Visit Number  3    Number of Visits  17    Date for PT Re-Evaluation  10/04/18    PT Start Time  0800    PT Stop Time  0853    PT Time Calculation (min)  53 min    Activity Tolerance  Patient tolerated treatment well    Behavior During Therapy  [facility] for tasks assessed/performed       Past Medical History:  Diagnosis Date  . Arthritis of knee, right   . GERD (gastroesophageal reflux disease)   . Hypertension     Past Surgical History:  Procedure Laterality Date  . CHOLECYSTECTOMY    . TOTAL KNEE ARTHROPLASTY Right 07/16/2018  . TOTAL KNEE ARTHROPLASTY Right 07/16/2018   Procedure: RIGHT TOTAL KNEE REPLACEMENT;  Surgeon: Marybelle Killings, MD;  Location: [place];  Service: Orthopedics;  Laterality: Right;    There were no vitals filed for this visit.  Subjective Assessment - 08/17/18 0822    Subjective  Pt relays he would like to get more bend in his knee, but its doing okay.    Currently in Pain?  Yes    Pain Score  2     Pain Location  Knee    Pain Orientation  Right    Pain Descriptors / Indicators  Aching    Pain Type  Surgical pain         [facility] PT Assessment - 08/17/18 0001      PROM   Right Knee Flexion  95                   [facility] Adult PT Treatment/Exercise - 08/17/18 0001      Exercises   Exercises  Knee/Hip      Knee/Hip Exercises: Stretches   Active Hamstring Stretch  3 reps;30 seconds    Quad Stretch  3 reps;30 seconds   supine off edge of bed with strap   Gastroc Stretch  3 reps;30 seconds   supine with strap     Knee/Hip Exercises: Seated   Long Arc Quad  2 sets;10 reps   5  sec     Knee/Hip Exercises: Supine   Quad Sets  2 sets;10 reps   5 sec   Short Arc Quad Sets  2 sets;10 reps   5 sec   Heel Slides  AAROM;2 sets;10 reps   5 sec   Terminal Knee Extension  20 reps    Terminal Knee Extension Limitations  heel propped     Straight Leg Raises  3 sets;5 reps   flex and abd     Modalities   Modalities  Vasopneumatic      Vasopneumatic   Number Minutes Vasopneumatic   10 minutes    Vasopnuematic Location   Knee    Vasopneumatic Pressure  Low    Vasopneumatic Temperature   32      Manual Therapy   Manual Therapy  Joint mobilization;Passive ROM    Joint Mobilization  gentle patella and flexion    Passive ROM  Flexion and extension  PT Short Term Goals - 08/09/18 1727      PT SHORT TERM GOAL #1   Title  Pt will be I with HEP    Time  4    Period  Weeks    Target Date  09/06/18      PT SHORT TERM GOAL #2   Title  Pt will decrease R knee edema to 36.5cm over the tibial tuberosity and 44.3cm 10cm proximal to the tibial tuberosity to improve R knee ROM    Time  4    Period  Weeks    Status  New    Target Date  09/06/18        PT Long Term Goals - 08/09/18 1730      PT LONG TERM GOAL #1   Title  Pt will increase R knee flexion AROM to >/= 120 degrees and R knee extension AROM to </=2 degrees to improve function     Time  8    Period  Weeks    Status  New    Target Date  10/04/18      PT LONG TERM GOAL #2   Title  Pt will increase R knee strength to 5/5 to improve function and gait mechanics    Time  8    Period  Weeks    Status  New    Target Date  10/04/18      PT LONG TERM GOAL #3   Title  Pt will be able to stand with LRAD for >/= 60 min in order to perform work related duties per pt goal    Time  8    Period  Weeks    Status  New    Target Date  10/04/18      PT LONG TERM GOAL #4   Title  Pt will decrease FOTO score to </=39% limited to improve function and quality of life    Time  8    Period  Weeks     Status  New    Target Date  10/04/18            Plan - 08/17/18 0839    Clinical Impression Statement  Pt is making slow but steady progress in ROM and strength, see updated flexion PROM measurement. He had good tolerance to therex and was able to progress reps some today without complaints. MT performed to increase ROM. Continue poc    Rehab Potential  Good    PT Frequency  2x / week    PT Duration  8 weeks    PT Treatment/Interventions  ADLs/Self Care Home Management;Cryotherapy;Electrical Stimulation;Iontophoresis 4mg /ml Dexamethasone;Moist Heat;Ultrasound;DME Instruction;Gait training;Stair training;Functional mobility training;Therapeutic activities;Therapeutic exercise;Balance training;Neuromuscular re-education;Patient/family education;Manual techniques;Scar mobilization;Passive range of motion;Dry needling;Energy conservation;Taping;Vasopneumatic Device    PT Next Visit Plan  knee ROM and strengthening as tolerated, gait training with SPC or RW, manual prn, GameReady    PT Home Exercise Plan  hamstring stretch, quad set, SAQs, heel slides, SLR, seated flexion stretch    Consulted and Agree with Plan of Care  Patient       Patient will benefit from skilled therapeutic intervention in order to improve the following deficits and impairments:  Abnormal gait, Decreased knowledge of use of DME, Impaired sensation, Improper body mechanics, Pain, Decreased mobility, Postural dysfunction, Decreased activity tolerance, Decreased endurance, Decreased range of motion, Decreased strength, Hypomobility, Decreased balance, Difficulty walking, Increased edema  Visit Diagnosis: Acute pain of left knee  Stiffness of right knee, not elsewhere  classified  Other abnormalities of gait and mobility  Localized edema     Problem List Patient Active Problem List   Diagnosis Date Noted  . Arthritis of right knee 07/16/2018  . Chronic pain of right knee 10/11/2017  . Prediabetes 01/02/2017   . Essential hypertension 09/04/2016    Debbe Odea, PT, DPT 08/17/2018, 8:42 AM  [facility] [address] [place], [place], [address] Phone: [phone]   Fax:  [phone]  Name: [name] MRN: [id number] Date of Birth: 11-Jun-1966

## 2018-08-19 ENCOUNTER — Encounter: Payer: Self-pay | Admitting: Physical Therapy

## 2018-08-19 ENCOUNTER — Ambulatory Visit: Payer: 59 | Admitting: Physical Therapy

## 2018-08-19 ENCOUNTER — Telehealth: Payer: Self-pay

## 2018-08-19 DIAGNOSIS — R600 Localized edema: Secondary | ICD-10-CM

## 2018-08-19 DIAGNOSIS — Z1211 Encounter for screening for malignant neoplasm of colon: Secondary | ICD-10-CM

## 2018-08-19 DIAGNOSIS — M25562 Pain in left knee: Secondary | ICD-10-CM | POA: Diagnosis not present

## 2018-08-19 DIAGNOSIS — R2689 Other abnormalities of gait and mobility: Secondary | ICD-10-CM

## 2018-08-19 DIAGNOSIS — M25661 Stiffness of right knee, not elsewhere classified: Secondary | ICD-10-CM

## 2018-08-19 NOTE — Telephone Encounter (Signed)
Referral placed.

## 2018-08-19 NOTE — Therapy (Signed)
[place], [place], [address] Phone: [phone]   Fax:  [phone]  Physical Therapy Treatment  Patient Details  Name: [name] MRN: [id number] Date of Birth: 03/16/1966 Referring Provider: Marybelle Killings, MD   Encounter Date: 08/19/2018  PT End of Session - 08/19/18 0935    Visit Number  4    Number of Visits  17    Date for PT Re-Evaluation  10/04/18    PT Start Time  0930    PT Stop Time  1030    PT Time Calculation (min)  60 min       Past Medical History:  Diagnosis Date  . Arthritis of knee, right   . GERD (gastroesophageal reflux disease)   . Hypertension     Past Surgical History:  Procedure Laterality Date  . CHOLECYSTECTOMY    . TOTAL KNEE ARTHROPLASTY Right 07/16/2018  . TOTAL KNEE ARTHROPLASTY Right 07/16/2018   Procedure: RIGHT TOTAL KNEE REPLACEMENT;  Surgeon: Marybelle Killings, MD;  Location: [place];  Service: Orthopedics;  Laterality: Right;    There were no vitals filed for this visit.  Subjective Assessment - 08/19/18 0929    Currently in Pain?  Yes    Pain Score  1     Pain Location  Knee    Pain Orientation  Right    Pain Descriptors / Indicators  Aching    Aggravating Factors   standing or walking too long, sitting too long    Pain Relieving Factors  change positions , ice , meds          [facility] PT Assessment - 08/19/18 0001      AROM   Right Knee Flexion  83   94 AAROM     PROM   Right Knee Extension  4    Right Knee Flexion  96                   [facility] Adult PT Treatment/Exercise - 08/19/18 0001      Knee/Hip Exercises: Stretches   Active Hamstring Stretch  1 rep;60 seconds   seated with foot on stool    Quad Stretch  3 reps;30 seconds   supine off edge of bed with strap   Gastroc Stretch  3 reps;30 seconds   slant board     Knee/Hip Exercises: Aerobic   Nustep  L2 LE x 5 min      Knee/Hip Exercises: Standing   Forward Step Up  2  sets;10 reps;Hand Hold: 1;Hand Hold: 2;Step Height: 4"      Knee/Hip Exercises: Seated   Long Arc Quad  2 sets;10 reps   5 sec   Sit to [employer]  15 reps;with UE support   elevated seat     Knee/Hip Exercises: Supine   Quad Sets  2 sets;10 reps   5 sec   Heel Slides  AAROM;2 sets;10 reps   5 sec   Terminal Knee Extension Limitations  heel propped     Straight Leg Raises  2 sets;10 reps    Straight Leg Raises Limitations  with initial quad set       Vasopneumatic   Number Minutes Vasopneumatic   10 minutes    Vasopnuematic Location   Knee    Vasopneumatic Pressure  Low    Vasopneumatic Temperature   32      Manual Therapy   Manual Therapy  Joint mobilization;Passive ROM    Manual  therapy comments  massage roller to thigh during hip flexor stretch     Joint Mobilization  gentle patella and flexion    Passive ROM  Flexion and extension               PT Short Term Goals - 08/09/18 1727      PT SHORT TERM GOAL #1   Title  Pt will be I with HEP    Time  4    Period  Weeks    Target Date  09/06/18      PT SHORT TERM GOAL #2   Title  Pt will decrease R knee edema to 36.5cm over the tibial tuberosity and 44.3cm 10cm proximal to the tibial tuberosity to improve R knee ROM    Time  4    Period  Weeks    Status  New    Target Date  09/06/18        PT Long Term Goals - 08/09/18 1730      PT LONG TERM GOAL #1   Title  Pt will increase R knee flexion AROM to >/= 120 degrees and R knee extension AROM to </=2 degrees to improve function     Time  8    Period  Weeks    Status  New    Target Date  10/04/18      PT LONG TERM GOAL #2   Title  Pt will increase R knee strength to 5/5 to improve function and gait mechanics    Time  8    Period  Weeks    Status  New    Target Date  10/04/18      PT LONG TERM GOAL #3   Title  Pt will be able to stand with LRAD for >/= 60 min in order to perform work related duties per pt goal    Time  8    Period  Weeks    Status  New     Target Date  10/04/18      PT LONG TERM GOAL #4   Title  Pt will decrease FOTO score to </=39% limited to improve function and quality of life    Time  8    Period  Weeks    Status  New    Target Date  10/04/18            Plan - 08/19/18 1105    Clinical Impression Statement  Began sit-stands from elevated seat and 4 inch step up which he tolerated well. AAROM progressing, Encouraged more stretching at home.     PT Next Visit Plan  knee ROM and strengthening as tolerated, gait training with SPC or RW, manual prn, GameReady    PT Home Exercise Plan  hamstring stretch, quad set, SAQs, heel slides, SLR, seated flexion stretch    Consulted and Agree with Plan of Care  Patient       Patient will benefit from skilled therapeutic intervention in order to improve the following deficits and impairments:  Abnormal gait, Decreased knowledge of use of DME, Impaired sensation, Improper body mechanics, Pain, Decreased mobility, Postural dysfunction, Decreased activity tolerance, Decreased endurance, Decreased range of motion, Decreased strength, Hypomobility, Decreased balance, Difficulty walking, Increased edema  Visit Diagnosis: Acute pain of left knee  Stiffness of right knee, not elsewhere classified  Other abnormalities of gait and mobility  Localized edema     Problem List Patient Active Problem List   Diagnosis Date Noted  .  Arthritis of right knee 07/16/2018  . Chronic pain of right knee 10/11/2017  . Prediabetes 01/02/2017  . Essential hypertension 09/04/2016    Dorene Ar, PTA 08/19/2018, 11:07 AM  [facility] [address] [place], [place], [address] Phone: [phone]   Fax:  [phone]  Name: [name] MRN: [id number] Date of Birth: 1966/01/17

## 2018-08-19 NOTE — Telephone Encounter (Signed)
Patient states he was suppose to be sent for a colonoscopy. Can you place this referral for him? Please advise. Thanks!

## 2018-08-19 NOTE — Telephone Encounter (Signed)
Called no answer. Left a message for patient to call back. Thanks!

## 2018-08-23 ENCOUNTER — Encounter: Payer: Self-pay | Admitting: Physical Therapy

## 2018-08-23 ENCOUNTER — Ambulatory Visit: Payer: 59 | Admitting: Physical Therapy

## 2018-08-23 DIAGNOSIS — M25562 Pain in left knee: Secondary | ICD-10-CM | POA: Diagnosis not present

## 2018-08-23 DIAGNOSIS — R2689 Other abnormalities of gait and mobility: Secondary | ICD-10-CM

## 2018-08-23 DIAGNOSIS — R600 Localized edema: Secondary | ICD-10-CM

## 2018-08-23 DIAGNOSIS — M25661 Stiffness of right knee, not elsewhere classified: Secondary | ICD-10-CM

## 2018-08-23 NOTE — Therapy (Signed)
[place], [place], [address] Phone: [phone]   Fax:  [phone]  Physical Therapy Treatment  Patient Details  Name: [name] MRN: [id number] Date of Birth: 05/07/1966 Referring Provider: Marybelle Killings, MD   Encounter Date: 08/23/2018  PT End of Session - 08/23/18 0936    Visit Number  5    Number of Visits  17    Date for PT Re-Evaluation  10/04/18    PT Start Time  0932    PT Stop Time  1030    PT Time Calculation (min)  58 min       Past Medical History:  Diagnosis Date  . Arthritis of knee, right   . GERD (gastroesophageal reflux disease)   . Hypertension     Past Surgical History:  Procedure Laterality Date  . CHOLECYSTECTOMY    . TOTAL KNEE ARTHROPLASTY Right 07/16/2018  . TOTAL KNEE ARTHROPLASTY Right 07/16/2018   Procedure: RIGHT TOTAL KNEE REPLACEMENT;  Surgeon: Marybelle Killings, MD;  Location: [place];  Service: Orthopedics;  Laterality: Right;    There were no vitals filed for this visit.  Subjective Assessment - 08/23/18 1010    Subjective  I want it to bend more. I have one step to get into my house. I use the other leg to step up.     Currently in Pain?  Yes    Pain Score  3     Pain Location  Knee    Pain Orientation  Right    Pain Descriptors / Indicators  Aching         [facility] PT Assessment - 08/23/18 0001      AROM   Right Knee Flexion  95      PROM   Right Knee Flexion  101                   [facility] Adult PT Treatment/Exercise - 08/23/18 0001      Knee/Hip Exercises: Stretches   Active Hamstring Stretch  1 rep;60 seconds   seated with foot on stool    Gastroc Stretch  3 reps;30 seconds   slant board     Knee/Hip Exercises: Aerobic   Recumbent Bike  partial to full backward revolutions x 5 minutes       Knee/Hip Exercises: Standing   Lateral Step Up  2 sets;10 reps;Step Height: 4";Hand Hold: 1    Forward Step Up  2 sets;10 reps;Hand Hold:  1;Step Height: 6"    Other Standing Knee Exercises  standing 3 way hip LT with SLS RT       Knee/Hip Exercises: Seated   Long Arc Quad  2 sets;10 reps   5 sec.2#    Sit to [place]  15 reps;without UE support   elevated seat     Knee/Hip Exercises: Supine   Heel Slides  AAROM;2 sets;10 reps   5 sec   Terminal Knee Extension  20 reps    Terminal Knee Extension Limitations  heel propped     Straight Leg Raises  2 sets;10 reps    Straight Leg Raises Limitations  with initial quad set       Vasopneumatic   Number Minutes Vasopneumatic   10 minutes    Vasopnuematic Location   Knee    Vasopneumatic Pressure  Low    Vasopneumatic Temperature   32      Manual Therapy   Passive ROM  Flexion PROM  PT Short Term Goals - 08/09/18 1727      PT SHORT TERM GOAL #1   Title  Pt will be I with HEP    Time  4    Period  Weeks    Target Date  09/06/18      PT SHORT TERM GOAL #2   Title  Pt will decrease R knee edema to 36.5cm over the tibial tuberosity and 44.3cm 10cm proximal to the tibial tuberosity to improve R knee ROM    Time  4    Period  Weeks    Status  New    Target Date  09/06/18        PT Long Term Goals - 08/09/18 1730      PT LONG TERM GOAL #1   Title  Pt will increase R knee flexion AROM to >/= 120 degrees and R knee extension AROM to </=2 degrees to improve function     Time  8    Period  Weeks    Status  New    Target Date  10/04/18      PT LONG TERM GOAL #2   Title  Pt will increase R knee strength to 5/5 to improve function and gait mechanics    Time  8    Period  Weeks    Status  New    Target Date  10/04/18      PT LONG TERM GOAL #3   Title  Pt will be able to stand with LRAD for >/= 60 min in order to perform work related duties per pt goal    Time  8    Period  Weeks    Status  New    Target Date  10/04/18      PT LONG TERM GOAL #4   Title  Pt will decrease FOTO score to </=39% limited to improve function and quality of life     Time  8    Period  Weeks    Status  New    Target Date  10/04/18            Plan - 08/23/18 1006    Clinical Impression Statement  Began Recumbent bike and pt was able to make full revolutions backward after several minutes of partial revolutions. Rom improving,     PT Next Visit Plan  knee ROM and strengthening as tolerated, gait training with SPC or RW, manual prn, GameReady    PT Home Exercise Plan  hamstring stretch, quad set, SAQs, heel slides, SLR, seated flexion stretch    Consulted and Agree with Plan of Care  Patient       Patient will benefit from skilled therapeutic intervention in order to improve the following deficits and impairments:  Abnormal gait, Decreased knowledge of use of DME, Impaired sensation, Improper body mechanics, Pain, Decreased mobility, Postural dysfunction, Decreased activity tolerance, Decreased endurance, Decreased range of motion, Decreased strength, Hypomobility, Decreased balance, Difficulty walking, Increased edema  Visit Diagnosis: Acute pain of left knee  Stiffness of right knee, not elsewhere classified  Other abnormalities of gait and mobility  Localized edema     Problem List Patient Active Problem List   Diagnosis Date Noted  . Arthritis of right knee 07/16/2018  . Chronic pain of right knee 10/11/2017  . Prediabetes 01/02/2017  . Essential hypertension 09/04/2016    Dorene Ar, PTA 08/23/2018, 10:13 AM  [facility] [address] [place], [place], [address]  Phone: [phone]   Fax:  [phone]  Name: [name] MRN: [id number] Date of Birth: 18-Feb-1966

## 2018-08-25 ENCOUNTER — Ambulatory Visit: Payer: 59 | Admitting: Physical Therapy

## 2018-08-25 DIAGNOSIS — M25661 Stiffness of right knee, not elsewhere classified: Secondary | ICD-10-CM

## 2018-08-25 DIAGNOSIS — M25562 Pain in left knee: Secondary | ICD-10-CM | POA: Diagnosis not present

## 2018-08-25 DIAGNOSIS — R600 Localized edema: Secondary | ICD-10-CM

## 2018-08-25 NOTE — Therapy (Signed)
[place], [place], [address] Phone: [phone]   Fax:  [phone]  Physical Therapy Treatment  Patient Details  Name: [name] MRN: [id number] Date of Birth: 1966/10/13 Referring Provider: Marybelle Killings, MD   Encounter Date: 08/25/2018  PT End of Session - 08/25/18 1055    Visit Number  6    Number of Visits  17    Date for PT Re-Evaluation  10/04/18    PT Start Time  0930    PT Stop Time  1025    PT Time Calculation (min)  55 min    Activity Tolerance  Patient tolerated treatment well    Behavior During Therapy  [facility] for tasks assessed/performed       Past Medical History:  Diagnosis Date  . Arthritis of knee, right   . GERD (gastroesophageal reflux disease)   . Hypertension     Past Surgical History:  Procedure Laterality Date  . CHOLECYSTECTOMY    . TOTAL KNEE ARTHROPLASTY Right 07/16/2018  . TOTAL KNEE ARTHROPLASTY Right 07/16/2018   Procedure: RIGHT TOTAL KNEE REPLACEMENT;  Surgeon: Marybelle Killings, MD;  Location: [place];  Service: Orthopedics;  Laterality: Right;    There were no vitals filed for this visit.  Subjective Assessment - 08/25/18 1051    Subjective  Pt relays his knee is doing a little better    Currently in Pain?  Yes    Pain Score  2     Pain Location  Knee                       [facility] Adult PT Treatment/Exercise - 08/25/18 0001      Exercises   Exercises  Knee/Hip      Knee/Hip Exercises: Stretches   Passive Hamstring Stretch  2 reps;Right;30 seconds    Other Knee/Hip Stretches  standing knee flexion lunge stretch on step 10 sec X10      Knee/Hip Exercises: Aerobic   Recumbent Bike  partial to full backward revolutions x 6 minutes       Knee/Hip Exercises: Standing   Functional Squat  10 reps   mini squat at counter     Knee/Hip Exercises: Seated   Long Arc Quad  2 sets;10 reps    Long Arc Quad Weight  3 lbs.      Knee/Hip Exercises:  Supine   Heel Slides  AAROM;2 sets;10 reps    Terminal Knee Extension  20 reps    Terminal Knee Extension Limitations  heel propped    Straight Leg Raises  2 sets;10 reps      Modalities   Modalities  Vasopneumatic      Vasopneumatic   Number Minutes Vasopneumatic   15 minutes    Vasopnuematic Location   Knee    Vasopneumatic Pressure  Medium    Vasopneumatic Temperature   32      Manual Therapy   Passive ROM  Flexion PROM                PT Short Term Goals - 08/09/18 1727      PT SHORT TERM GOAL #1   Title  Pt will be I with HEP    Time  4    Period  Weeks    Target Date  09/06/18      PT SHORT TERM GOAL #2   Title  Pt will decrease R knee edema to 36.5cm over  the tibial tuberosity and 44.3cm 10cm proximal to the tibial tuberosity to improve R knee ROM    Time  4    Period  Weeks    Status  New    Target Date  09/06/18        PT Long Term Goals - 08/09/18 1730      PT LONG TERM GOAL #1   Title  Pt will increase R knee flexion AROM to >/= 120 degrees and R knee extension AROM to </=2 degrees to improve function     Time  8    Period  Weeks    Status  New    Target Date  10/04/18      PT LONG TERM GOAL #2   Title  Pt will increase R knee strength to 5/5 to improve function and gait mechanics    Time  8    Period  Weeks    Status  New    Target Date  10/04/18      PT LONG TERM GOAL #3   Title  Pt will be able to stand with LRAD for >/= 60 min in order to perform work related duties per pt goal    Time  8    Period  Weeks    Status  New    Target Date  10/04/18      PT LONG TERM GOAL #4   Title  Pt will decrease FOTO score to </=39% limited to improve function and quality of life    Time  8    Period  Weeks    Status  New    Target Date  10/04/18            Plan - 08/25/18 1055    Clinical Impression Statement  Session focused on knee strength and ROM. He was shown new knee flexion lunge stretch on step to further help ROM. Pt still has  mild swelling so vasopnuematic performed post tx.    Rehab Potential  Good    PT Frequency  2x / week    PT Duration  8 weeks    PT Treatment/Interventions  ADLs/Self Care Home Management;Cryotherapy;Electrical Stimulation;Iontophoresis 4mg /ml Dexamethasone;Moist Heat;Ultrasound;DME Instruction;Gait training;Stair training;Functional mobility training;Therapeutic activities;Therapeutic exercise;Balance training;Neuromuscular re-education;Patient/family education;Manual techniques;Scar mobilization;Passive range of motion;Dry needling;Energy conservation;Taping;Vasopneumatic Device    PT Next Visit Plan  knee ROM and strengthening as tolerated, gait training with SPC or RW, manual prn, GameReady       Patient will benefit from skilled therapeutic intervention in order to improve the following deficits and impairments:  Abnormal gait, Decreased knowledge of use of DME, Impaired sensation, Improper body mechanics, Pain, Decreased mobility, Postural dysfunction, Decreased activity tolerance, Decreased endurance, Decreased range of motion, Decreased strength, Hypomobility, Decreased balance, Difficulty walking, Increased edema  Visit Diagnosis: Acute pain of left knee  Stiffness of right knee, not elsewhere classified  Localized edema     Problem List Patient Active Problem List   Diagnosis Date Noted  . Arthritis of right knee 07/16/2018  . Chronic pain of right knee 10/11/2017  . Prediabetes 01/02/2017  . Essential hypertension 09/04/2016    Debbe Odea, PT, DPT 08/25/2018, 10:58 AM  [facility] [address] [place], [place], [address] Phone: [phone]   Fax:  [phone]  Name: [name] MRN: [id number] Date of Birth: 1966-11-30

## 2018-08-31 ENCOUNTER — Ambulatory Visit: Payer: 59 | Attending: Orthopaedic Surgery | Admitting: Physical Therapy

## 2018-08-31 ENCOUNTER — Encounter: Payer: Self-pay | Admitting: Physical Therapy

## 2018-08-31 DIAGNOSIS — R600 Localized edema: Secondary | ICD-10-CM | POA: Insufficient documentation

## 2018-08-31 DIAGNOSIS — R2689 Other abnormalities of gait and mobility: Secondary | ICD-10-CM | POA: Insufficient documentation

## 2018-08-31 DIAGNOSIS — M25562 Pain in left knee: Secondary | ICD-10-CM | POA: Diagnosis not present

## 2018-08-31 DIAGNOSIS — M25661 Stiffness of right knee, not elsewhere classified: Secondary | ICD-10-CM | POA: Diagnosis present

## 2018-08-31 NOTE — Therapy (Signed)
[place], [place], [address] Phone: [phone]   Fax:  [phone]  Physical Therapy Treatment  Patient Details  Name: [name] MRN: [id number] Date of Birth: 03/11/1966 Referring Provider: Marybelle Killings, MD   Encounter Date: 08/31/2018  PT End of Session - 08/31/18 0934    Visit Number  7    Number of Visits  17    Date for PT Re-Evaluation  10/04/18    PT Start Time  0934    PT Stop Time  1025    PT Time Calculation (min)  51 min    Activity Tolerance  Patient tolerated treatment well    Behavior During Therapy  [facility] for tasks assessed/performed       Past Medical History:  Diagnosis Date  . Arthritis of knee, right   . GERD (gastroesophageal reflux disease)   . Hypertension     Past Surgical History:  Procedure Laterality Date  . CHOLECYSTECTOMY    . TOTAL KNEE ARTHROPLASTY Right 07/16/2018  . TOTAL KNEE ARTHROPLASTY Right 07/16/2018   Procedure: RIGHT TOTAL KNEE REPLACEMENT;  Surgeon: Marybelle Killings, MD;  Location: [place];  Service: Orthopedics;  Laterality: Right;    There were no vitals filed for this visit.  Subjective Assessment - 08/31/18 0936    Subjective  "I am doing pretty good, The knee is bending better"     Currently in Pain?  Yes    Pain Score  1     Pain Orientation  Right    Pain Descriptors / Indicators  Aching    Pain Type  Surgical pain    Pain Onset  1 to 4 weeks ago    Pain Frequency  Intermittent    Aggravating Factors   laying down at night    Pain Relieving Factors  changing position, ice, meds         [facility] PT Assessment - 08/31/18 0939      AROM   Right Knee Extension  8    Right Knee Flexion  90   101 followed treatment                  [facility] Adult PT Treatment/Exercise - 08/31/18 1000      Knee/Hip Exercises: Aerobic   Recumbent Bike  full revolutions backward and forward x 6 min lowered seated at 3 min to promote knee flexion       Knee/Hip Exercises: Supine   Quad Sets  1 set;10 reps   with manual over pressure   Short Arc Quad Sets  2 sets;10 reps;Right;Strengthening   holding 2 sec      Vasopneumatic   Number Minutes Vasopneumatic   10 minutes    Vasopnuematic Location   Knee    Vasopneumatic Pressure  Medium    Vasopneumatic Temperature   34      Manual Therapy   Joint Mobilization  AP Grade 3-4 with pt bending knee actively between reps, PA in supine combined with quad set     Passive ROM  Flexion PROM, with towel in popliteal space. with manual over pressure following active hamstring curl.             PT Education - 08/31/18 1013    Education Details  to wear shoes to prevent potential tripping hazard     Person(s) Educated  Patient    Methods  Explanation;Verbal cues    Comprehension  Verbalized understanding;Verbal cues required  PT Short Term Goals - 08/09/18 1727      PT SHORT TERM GOAL #1   Title  Pt will be I with HEP    Time  4    Period  Weeks    Target Date  09/06/18      PT SHORT TERM GOAL #2   Title  Pt will decrease R knee edema to 36.5cm over the tibial tuberosity and 44.3cm 10cm proximal to the tibial tuberosity to improve R knee ROM    Time  4    Period  Weeks    Status  New    Target Date  09/06/18        PT Long Term Goals - 08/09/18 1730      PT LONG TERM GOAL #1   Title  Pt will increase R knee flexion AROM to >/= 120 degrees and R knee extension AROM to </=2 degrees to improve function     Time  8    Period  Weeks    Status  New    Target Date  10/04/18      PT LONG TERM GOAL #2   Title  Pt will increase R knee strength to 5/5 to improve function and gait mechanics    Time  8    Period  Weeks    Status  New    Target Date  10/04/18      PT LONG TERM GOAL #3   Title  Pt will be able to stand with LRAD for >/= 60 min in order to perform work related duties per pt goal    Time  8    Period  Weeks    Status  New    Target Date  10/04/18       PT LONG TERM GOAL #4   Title  Pt will decrease FOTO score to </=39% limited to improve function and quality of life    Time  8    Period  Weeks    Status  New    Target Date  10/04/18            Plan - 08/31/18 1012    Clinical Impression Statement  focused on manual techniques to improve mobility and reduce edema via KT taping. end of session he improved flexion to 101 degrees and was able to going reciprocally around the bike. end of session he reported no increase in pain, continued vaso for edema.     PT Next Visit Plan  knee ROM and strengthening as tolerated, gait training with SPC or RW, manual prn, GameReady    PT Home Exercise Plan  hamstring stretch, quad set, SAQs, heel slides, SLR, seated flexion stretch    Consulted and Agree with Plan of Care  Patient       Patient will benefit from skilled therapeutic intervention in order to improve the following deficits and impairments:  Abnormal gait, Decreased knowledge of use of DME, Impaired sensation, Improper body mechanics, Pain, Decreased mobility, Postural dysfunction, Decreased activity tolerance, Decreased endurance, Decreased range of motion, Decreased strength, Hypomobility, Decreased balance, Difficulty walking, Increased edema  Visit Diagnosis: Acute pain of left knee  Stiffness of right knee, not elsewhere classified  Localized edema  Other abnormalities of gait and mobility     Problem List Patient Active Problem List   Diagnosis Date Noted  . Arthritis of right knee 07/16/2018  . Chronic pain of right knee 10/11/2017  . Prediabetes 01/02/2017  . Essential hypertension 09/04/2016  Starr [place] PT, DPT, LAT, ATC  08/31/18  10:17 AM      [facility] [address] [place], [place], [address] Phone: [phone]   Fax:  [phone]  Name: [name] MRN: [id number] Date of Birth: [birth year]

## 2018-09-01 MED FILL — AMLODIPINE BESYLATE 10 MG T: 10 | 30 days supply | Qty: 30 | Fill #4

## 2018-09-02 ENCOUNTER — Ambulatory Visit: Payer: 59 | Admitting: Physical Therapy

## 2018-09-02 ENCOUNTER — Encounter: Payer: Self-pay | Admitting: Physical Therapy

## 2018-09-02 DIAGNOSIS — R600 Localized edema: Secondary | ICD-10-CM

## 2018-09-02 DIAGNOSIS — M25562 Pain in left knee: Secondary | ICD-10-CM | POA: Diagnosis not present

## 2018-09-02 DIAGNOSIS — M25661 Stiffness of right knee, not elsewhere classified: Secondary | ICD-10-CM

## 2018-09-02 DIAGNOSIS — R2689 Other abnormalities of gait and mobility: Secondary | ICD-10-CM

## 2018-09-02 NOTE — Therapy (Signed)
[place], [place], [address] Phone: [phone]   Fax:  [phone]  Physical Therapy Treatment  Patient Details  Name: [name] MRN: [id number] Date of Birth: 1966-09-27 Referring Provider: Marybelle Killings, MD   Encounter Date: 09/02/2018  PT End of Session - 09/02/18 0936    Visit Number  8    Number of Visits  17    Date for PT Re-Evaluation  10/04/18    PT Start Time  0930    PT Stop Time  1025    PT Time Calculation (min)  55 min       Past Medical History:  Diagnosis Date  . Arthritis of knee, right   . GERD (gastroesophageal reflux disease)   . Hypertension     Past Surgical History:  Procedure Laterality Date  . CHOLECYSTECTOMY    . TOTAL KNEE ARTHROPLASTY Right 07/16/2018  . TOTAL KNEE ARTHROPLASTY Right 07/16/2018   Procedure: RIGHT TOTAL KNEE REPLACEMENT;  Surgeon: Marybelle Killings, MD;  Location: [place];  Service: Orthopedics;  Laterality: Right;    There were no vitals filed for this visit.  Subjective Assessment - 09/02/18 0936    Subjective  Not sure if tape helpful. No pain right now.     Currently in Pain?  No/denies         [facility] PT Assessment - 09/02/18 0001      AROM   Right Knee Flexion  98                   [facility] Adult PT Treatment/Exercise - 09/02/18 0001      Exercises   Exercises  Knee/Hip      Knee/Hip Exercises: Stretches   Quad Stretch Limitations  prone knee flexion with strap 3 x 30 sec    Hip Flexor Stretch  Right;1 rep;60 seconds    Gastroc Stretch  3 reps;30 seconds   slant board   Other Knee/Hip Stretches  standing knee flexion lunge stretch on step 10 sec X10      Knee/Hip Exercises: Aerobic   Recumbent Bike  full revolutions backward and forward x 6 min lowered seated at 3 min to promote knee flexion      Knee/Hip Exercises: Standing   Heel Raises  10 reps    Lateral Step Up  2 sets;10 reps;Hand Hold: 1;Step Height: 6"    Forward Step Up  2 sets;10 reps;Hand Hold: 1;Step Height: 6"    Functional Squat  10 reps    Other Standing Knee Exercises  standing 3 way hip LT with SLS RT       Knee/Hip Exercises: Prone   Hamstring Curl  20 reps      Vasopneumatic   Number Minutes Vasopneumatic   10 minutes    Vasopnuematic Location   Knee    Vasopneumatic Pressure  Medium    Vasopneumatic Temperature   34      Manual Therapy   Joint Mobilization  A/P grade 3 in hooklying, inferior patella mobs     Passive ROM  Flexion PROM with PTA  forearm in poplitieal space.                PT Short Term Goals - 08/09/18 1727      PT SHORT TERM GOAL #1   Title  Pt will be I with HEP    Time  4    Period  Weeks    Target Date  09/06/18      PT SHORT TERM GOAL #2   Title  Pt will decrease R knee edema to 36.5cm over the tibial tuberosity and 44.3cm 10cm proximal to the tibial tuberosity to improve R knee ROM    Time  4    Period  Weeks    Status  New    Target Date  09/06/18        PT Long Term Goals - 08/09/18 1730      PT LONG TERM GOAL #1   Title  Pt will increase R knee flexion AROM to >/= 120 degrees and R knee extension AROM to </=2 degrees to improve function     Time  8    Period  Weeks    Status  New    Target Date  10/04/18      PT LONG TERM GOAL #2   Title  Pt will increase R knee strength to 5/5 to improve function and gait mechanics    Time  8    Period  Weeks    Status  New    Target Date  10/04/18      PT LONG TERM GOAL #3   Title  Pt will be able to stand with LRAD for >/= 60 min in order to perform work related duties per pt goal    Time  8    Period  Weeks    Status  New    Target Date  10/04/18      PT LONG TERM GOAL #4   Title  Pt will decrease FOTO score to </=39% limited to improve function and quality of life    Time  8    Period  Weeks    Status  New    Target Date  10/04/18            Plan - 09/02/18 1015    Clinical Impression Statement  Continued with  manual and stretching to increase knee flexion. Began prone knee flexion stretching with good tolerance. 101 AROM flexion at end of session. Continued with functional strengthening as tolerated. Vaso for edema and soreness at end of session.     PT Next Visit Plan  knee ROM and strengthening as tolerated, gait training with SPC or RW, manual prn, GameReady    PT Home Exercise Plan  hamstring stretch, quad set, SAQs, heel slides, SLR, seated flexion stretch    Consulted and Agree with Plan of Care  Patient       Patient will benefit from skilled therapeutic intervention in order to improve the following deficits and impairments:  Abnormal gait, Decreased knowledge of use of DME, Impaired sensation, Improper body mechanics, Pain, Decreased mobility, Postural dysfunction, Decreased activity tolerance, Decreased endurance, Decreased range of motion, Decreased strength, Hypomobility, Decreased balance, Difficulty walking, Increased edema  Visit Diagnosis: Acute pain of left knee  Other abnormalities of gait and mobility  Localized edema  Stiffness of right knee, not elsewhere classified     Problem List Patient Active Problem List   Diagnosis Date Noted  . Arthritis of right knee 07/16/2018  . Chronic pain of right knee 10/11/2017  . Prediabetes 01/02/2017  . Essential hypertension 09/04/2016    Dorene Ar, PTA 09/02/2018, 11:32 AM  [facility] [address] [place], [place], [address] Phone: [phone]   Fax:  [phone]  Name: [name] MRN: [id number] Date of Birth: Jul 27, 1966

## 2018-09-07 ENCOUNTER — Encounter: Payer: Self-pay | Admitting: Physical Therapy

## 2018-09-07 ENCOUNTER — Ambulatory Visit: Payer: 59 | Admitting: Physical Therapy

## 2018-09-07 DIAGNOSIS — R600 Localized edema: Secondary | ICD-10-CM

## 2018-09-07 DIAGNOSIS — M25661 Stiffness of right knee, not elsewhere classified: Secondary | ICD-10-CM

## 2018-09-07 DIAGNOSIS — R2689 Other abnormalities of gait and mobility: Secondary | ICD-10-CM

## 2018-09-07 DIAGNOSIS — M25562 Pain in left knee: Secondary | ICD-10-CM | POA: Diagnosis not present

## 2018-09-07 NOTE — Therapy (Signed)
[place], [place], [address] Phone: [phone]   Fax:  [phone]  Physical Therapy Treatment  Patient Details  Name: [name] MRN: [id number] Date of Birth: [birth year] Referring Provider: Marybelle Killings, MD   Encounter Date: 09/07/2018  PT End of Session - 09/07/18 0943    Visit Number  9    Number of Visits  17    Date for PT Re-Evaluation  10/04/18    PT Start Time  0932    PT Stop Time  1025    PT Time Calculation (min)  53 min       Past Medical History:  Diagnosis Date  . Arthritis of knee, right   . GERD (gastroesophageal reflux disease)   . Hypertension     Past Surgical History:  Procedure Laterality Date  . CHOLECYSTECTOMY    . TOTAL KNEE ARTHROPLASTY Right 07/16/2018  . TOTAL KNEE ARTHROPLASTY Right 07/16/2018   Procedure: RIGHT TOTAL KNEE REPLACEMENT;  Surgeon: Marybelle Killings, MD;  Location: [place];  Service: Orthopedics;  Laterality: Right;    There were no vitals filed for this visit.      [facility] PT Assessment - 09/07/18 0001      AROM   Right Knee Flexion  101   106 at end of session                  [facility] Adult PT Treatment/Exercise - 09/07/18 0001      Exercises   Exercises  Knee/Hip      Knee/Hip Exercises: Aerobic   Recumbent Bike  full revolutions backward and forward x 6 min lowered seated at 3 min to promote knee flexion      Knee/Hip Exercises: Machines for Strengthening   Total Gym Leg Press  1.5, 2.5 plates x 20 each, 1 plate single leg with smaller rom       Knee/Hip Exercises: Standing   Heel Raises  15 reps    Lateral Step Up  2 sets;10 reps;Hand Hold: 1;Step Height: 6"    Forward Step Up  2 sets;10 reps;Hand Hold: 1;Step Height: 6"    Wall Squat  10 reps    Other Standing Knee Exercises  standing 3 way hip LT with SLS RT       Knee/Hip Exercises: Seated   Hamstring Curl  20 reps    Hamstring Limitations  red    Sit to Sand  --       Knee/Hip Exercises: Supine   Heel Slides  AROM;10 reps      Knee/Hip Exercises: Prone   Hamstring Curl  20 reps      Vasopneumatic   Number Minutes Vasopneumatic   10 minutes    Vasopnuematic Location   Knee    Vasopneumatic Pressure  Medium    Vasopneumatic Temperature   34      Manual Therapy   Joint Mobilization  A/P grade 3 for flexion and extension,  inferior patella mobs     Passive ROM  Flexion PROM with PTA  forearm in poplitieal space. Supine extension PROM                PT Short Term Goals - 08/09/18 1727      PT SHORT TERM GOAL #1   Title  Pt will be I with HEP    Time  4    Period  Weeks    Target Date  09/06/18  PT SHORT TERM GOAL #2   Title  Pt will decrease R knee edema to 36.5cm over the tibial tuberosity and 44.3cm 10cm proximal to the tibial tuberosity to improve R knee ROM    Time  4    Period  Weeks    Status  New    Target Date  09/06/18        PT Long Term Goals - 08/09/18 1730      PT LONG TERM GOAL #1   Title  Pt will increase R knee flexion AROM to >/= 120 degrees and R knee extension AROM to </=2 degrees to improve function     Time  8    Period  Weeks    Status  New    Target Date  10/04/18      PT LONG TERM GOAL #2   Title  Pt will increase R knee strength to 5/5 to improve function and gait mechanics    Time  8    Period  Weeks    Status  New    Target Date  10/04/18      PT LONG TERM GOAL #3   Title  Pt will be able to stand with LRAD for >/= 60 min in order to perform work related duties per pt goal    Time  8    Period  Weeks    Status  New    Target Date  10/04/18      PT LONG TERM GOAL #4   Title  Pt will decrease FOTO score to </=39% limited to improve function and quality of life    Time  8    Period  Weeks    Status  New    Target Date  10/04/18            Plan - 09/07/18 1042    Clinical Impression Statement  Continued with Manual and PROM to recover Right knee ROM. He contiues to  progress and  measured 106 at end of session today. Demonstrates R knee weakness. Significant difficulty with single leg leg press. Began Wall slides with difficulty performing 10 reps. Vaso at end of session to decrease pain and edema.     PT Next Visit Plan  knee ROM and strengthening as tolerated, gait training with SPC or RW, manual prn, GameReady    PT Home Exercise Plan  hamstring stretch, quad set, SAQs, heel slides, SLR, seated flexion stretch    Consulted and Agree with Plan of Care  Patient       Patient will benefit from skilled therapeutic intervention in order to improve the following deficits and impairments:  Abnormal gait, Decreased knowledge of use of DME, Impaired sensation, Improper body mechanics, Pain, Decreased mobility, Postural dysfunction, Decreased activity tolerance, Decreased endurance, Decreased range of motion, Decreased strength, Hypomobility, Decreased balance, Difficulty walking, Increased edema  Visit Diagnosis: Acute pain of left knee  Other abnormalities of gait and mobility  Localized edema  Stiffness of right knee, not elsewhere classified     Problem List Patient Active Problem List   Diagnosis Date Noted  . Arthritis of right knee 07/16/2018  . Chronic pain of right knee 10/11/2017  . Prediabetes 01/02/2017  . Essential hypertension 09/04/2016    Dorene Ar, PTA 09/07/2018, 10:45 AM  [facility] [address] [place], [place], [address] Phone: [phone]   Fax:  [phone]  Name: [name] MRN: [id number] Date of Birth: 07/09/1966

## 2018-09-09 ENCOUNTER — Encounter: Payer: Self-pay | Admitting: Physical Therapy

## 2018-09-09 ENCOUNTER — Ambulatory Visit: Payer: 59 | Admitting: Physical Therapy

## 2018-09-09 DIAGNOSIS — M25562 Pain in left knee: Secondary | ICD-10-CM

## 2018-09-09 DIAGNOSIS — R2689 Other abnormalities of gait and mobility: Secondary | ICD-10-CM

## 2018-09-09 DIAGNOSIS — R600 Localized edema: Secondary | ICD-10-CM

## 2018-09-09 DIAGNOSIS — M25661 Stiffness of right knee, not elsewhere classified: Secondary | ICD-10-CM

## 2018-09-09 NOTE — Therapy (Signed)
[name], [place], [address] Phone: [phone]   Fax:  [phone]  Physical Therapy Treatment  Patient Details  Name: [name] MRN: [id number] Date of Birth: 26-Jul-1966 Referring Provider: Marybelle Killings, MD   Encounter Date: 09/09/2018  PT End of Session - 09/09/18 1014    Visit Number  10    Number of Visits  17    Date for PT Re-Evaluation  10/04/18    PT Start Time  0934    PT Stop Time  1025    PT Time Calculation (min)  51 min    Activity Tolerance  Patient tolerated treatment well    Behavior During Therapy  [facility] for tasks assessed/performed       Past Medical History:  Diagnosis Date  . Arthritis of knee, right   . GERD (gastroesophageal reflux disease)   . Hypertension     Past Surgical History:  Procedure Laterality Date  . CHOLECYSTECTOMY    . TOTAL KNEE ARTHROPLASTY Right 07/16/2018  . TOTAL KNEE ARTHROPLASTY Right 07/16/2018   Procedure: RIGHT TOTAL KNEE REPLACEMENT;  Surgeon: Marybelle Killings, MD;  Location: [place];  Service: Orthopedics;  Laterality: Right;    There were no vitals filed for this visit.  Subjective Assessment - 09/09/18 0939    Subjective  " no pain today, been doing good"     Currently in Pain?  No/denies    Aggravating Factors   bending the knee     Pain Relieving Factors  changing position, ice, meds         [facility] PT Assessment - 09/09/18 0940      AROM   Right Knee Flexion  99   107 following mobs and proM                  [facility] Adult PT Treatment/Exercise - 09/09/18 1001      Exercises   Exercises  Knee/Hip      Knee/Hip Exercises: Stretches   Quad Stretch Limitations  prone knee flexion with strap 3 x 30 sec    Gastroc Stretch  3 reps;30 seconds   slant board     Knee/Hip Exercises: Aerobic   Recumbent Bike  L1 x 6 min    lowering seat every 2 min     Knee/Hip Exercises: Machines for Strengthening   Cybex Knee Extension   2 x 10 10# concentric bil LE and eccentric with RLE only    Cybex Knee Flexion  2 x 10 15# concentric bil and eccentric with RLE only      Knee/Hip Exercises: Standing   Heel Raises  20 reps      Knee/Hip Exercises: Supine   Heel Slides  AROM;10 reps;2 sets;Right    Straight Leg Raises  1 set;20 reps   combined with sustained quad set     Vasopneumatic   Number Minutes Vasopneumatic   10 minutes    Vasopnuematic Location   Knee    Vasopneumatic Pressure  Medium    Vasopneumatic Temperature   34      Manual Therapy   Joint Mobilization  A/P grade 4 with pt actively flexing knee between sets.     Passive ROM  Flexion PROM with PT  forearm in poplitieal space. Supine extension PROM                PT Short Term Goals - 09/09/18 1016      PT SHORT  TERM GOAL #1   Title  Pt will be I with HEP    Time  4    Period  Weeks    Status  Achieved      PT SHORT TERM GOAL #2   Title  Pt will decrease R knee edema to 36.5cm over the tibial tuberosity and 44.3cm 10cm proximal to the tibial tuberosity to improve R knee ROM    Time  4    Period  Weeks    Status  Unable to assess        PT Long Term Goals - 08/09/18 1730      PT LONG TERM GOAL #1   Title  Pt will increase R knee flexion AROM to >/= 120 degrees and R knee extension AROM to </=2 degrees to improve function     Time  8    Period  Weeks    Status  New    Target Date  10/04/18      PT LONG TERM GOAL #2   Title  Pt will increase R knee strength to 5/5 to improve function and gait mechanics    Time  8    Period  Weeks    Status  New    Target Date  10/04/18      PT LONG TERM GOAL #3   Title  Pt will be able to stand with LRAD for >/= 60 min in order to perform work related duties per pt goal    Time  8    Period  Weeks    Status  New    Target Date  10/04/18      PT LONG TERM GOAL #4   Title  Pt will decrease FOTO score to </=39% limited to improve function and quality of life    Time  8    Period   Weeks    Status  New    Target Date  10/04/18            Plan - 09/09/18 1014    Clinical Impression Statement  pt reports no pain today. continued working on ROM which he is improving and increased following manual therapy to 107. progressed strengthening with emphasis placed on eccentric strength, he tolerated exercise well. continued vaso end of session for pain and swelling.     PT Treatment/Interventions  ADLs/Self Care Home Management;Cryotherapy;Electrical Stimulation;Iontophoresis 4mg /ml Dexamethasone;Moist Heat;Ultrasound;DME Instruction;Gait training;Stair training;Functional mobility training;Therapeutic activities;Therapeutic exercise;Balance training;Neuromuscular re-education;Patient/family education;Manual techniques;Scar mobilization;Passive range of motion;Dry needling;Energy conservation;Taping;Vasopneumatic Device    PT Next Visit Plan  knee ROM and strengthening as tolerated, gait training with SPC or RW, manual prn, GameReady, measure edema    PT Home Exercise Plan  hamstring stretch, quad set, SAQs, heel slides, SLR, seated flexion stretch    Consulted and Agree with Plan of Care  Patient       Patient will benefit from skilled therapeutic intervention in order to improve the following deficits and impairments:  Abnormal gait, Decreased knowledge of use of DME, Impaired sensation, Improper body mechanics, Pain, Decreased mobility, Postural dysfunction, Decreased activity tolerance, Decreased endurance, Decreased range of motion, Decreased strength, Hypomobility, Decreased balance, Difficulty walking, Increased edema  Visit Diagnosis: Acute pain of left knee  Other abnormalities of gait and mobility  Localized edema  Stiffness of right knee, not elsewhere classified     Problem List Patient Active Problem List   Diagnosis Date Noted  . Arthritis of right knee 07/16/2018  . Chronic pain of right  knee 10/11/2017  . Prediabetes 01/02/2017  . Essential  hypertension 09/04/2016   Starr Lake PT, DPT, LAT, ATC  09/09/18  10:17 AM      [facility] [address] [place], [place], [address] Phone: [phone]   Fax:  [phone]  Name: [name] MRN: [id number] Date of Birth: [birth year]

## 2018-09-14 ENCOUNTER — Ambulatory Visit: Payer: 59 | Admitting: Physical Therapy

## 2018-09-14 ENCOUNTER — Encounter: Payer: Self-pay | Admitting: Physical Therapy

## 2018-09-14 DIAGNOSIS — R600 Localized edema: Secondary | ICD-10-CM

## 2018-09-14 DIAGNOSIS — R2689 Other abnormalities of gait and mobility: Secondary | ICD-10-CM

## 2018-09-14 DIAGNOSIS — M25562 Pain in left knee: Secondary | ICD-10-CM | POA: Diagnosis not present

## 2018-09-14 DIAGNOSIS — M25661 Stiffness of right knee, not elsewhere classified: Secondary | ICD-10-CM

## 2018-09-14 NOTE — Therapy (Signed)
[place], [place], [address] Phone: [phone]   Fax:  [phone]  Physical Therapy Treatment  Patient Details  Name: [name] MRN: [id number] Date of Birth: 1966-06-30 Referring Provider: Marybelle Killings, MD   Encounter Date: 09/14/2018  PT End of Session - 09/14/18 1026    Visit Number  11    Number of Visits  17    Date for PT Re-Evaluation  10/04/18    PT Start Time  0932    PT Stop Time  1015    PT Time Calculation (min)  43 min       Past Medical History:  Diagnosis Date  . Arthritis of knee, right   . GERD (gastroesophageal reflux disease)   . Hypertension     Past Surgical History:  Procedure Laterality Date  . CHOLECYSTECTOMY    . TOTAL KNEE ARTHROPLASTY Right 07/16/2018  . TOTAL KNEE ARTHROPLASTY Right 07/16/2018   Procedure: RIGHT TOTAL KNEE REPLACEMENT;  Surgeon: Marybelle Killings, MD;  Location: [place];  Service: Orthopedics;  Laterality: Right;    There were no vitals filed for this visit.  Subjective Assessment - 09/14/18 0941    Subjective  I pretty much so not use the cane anymore.     Currently in Pain?  No/denies         [facility] PT Assessment - 09/14/18 0001      AROM   Right Knee Flexion  102   108 after treatment                  [facility] Adult PT Treatment/Exercise - 09/14/18 0001      Knee/Hip Exercises: Stretches   Active Hamstring Stretch  3 reps;30 seconds   seated   Gastroc Stretch  3 reps;30 seconds   slant board     Knee/Hip Exercises: Aerobic   Recumbent Bike  L3 x 5 minutes       Knee/Hip Exercises: Machines for Strengthening   Cybex Knee Extension  2 x 10 10# concentric bil LE and eccentric with RLE only    Cybex Knee Flexion  2 x 10 20# concentric bil and eccentric with RLE only    Total Gym Leg Press  2 plates bilateral , 1 plate single leg       Knee/Hip Exercises: Standing   Heel Raises  20 reps    Wall Squat  10 reps;2 sets       Manual Therapy   Joint Mobilization  A/P grade 3 for flexion and extension,  inferior patella mobs     Passive ROM  Flexion PROM with PT  forearm in poplitieal space. Supine extension PROM                PT Short Term Goals - 09/09/18 1016      PT SHORT TERM GOAL #1   Title  Pt will be I with HEP    Time  4    Period  Weeks    Status  Achieved      PT SHORT TERM GOAL #2   Title  Pt will decrease R knee edema to 36.5cm over the tibial tuberosity and 44.3cm 10cm proximal to the tibial tuberosity to improve R knee ROM    Time  4    Period  Weeks    Status  Unable to assess        PT Long Term Goals - 08/09/18 1730  PT LONG TERM GOAL #1   Title  Pt will increase R knee flexion AROM to >/= 120 degrees and R knee extension AROM to </=2 degrees to improve function     Time  8    Period  Weeks    Status  New    Target Date  10/04/18      PT LONG TERM GOAL #2   Title  Pt will increase R knee strength to 5/5 to improve function and gait mechanics    Time  8    Period  Weeks    Status  New    Target Date  10/04/18      PT LONG TERM GOAL #3   Title  Pt will be able to stand with LRAD for >/= 60 min in order to perform work related duties per pt goal    Time  8    Period  Weeks    Status  New    Target Date  10/04/18      PT LONG TERM GOAL #4   Title  Pt will decrease FOTO score to </=39% limited to improve function and quality of life    Time  8    Period  Weeks    Status  New    Target Date  10/04/18            Plan - 09/14/18 0941    Clinical Impression Statement  Pt arrives carrying [facility] and demonstates improved gait mechanics. Non antlagic gait today. He measures 102 at start of session and 108 after stretching and manual. Trial without VASO at end of session in attempt to retain increased flexion ROM.    PT Next Visit Plan  Try manual after wram up; measure edema  for STGs ; knee ROM and strengthening as tolerated, , manual prn, GameReady prn      PT Home Exercise Plan  hamstring stretch, quad set, SAQs, heel slides, SLR, seated flexion stretch    Consulted and Agree with Plan of Care  Patient       Patient will benefit from skilled therapeutic intervention in order to improve the following deficits and impairments:  Abnormal gait, Decreased knowledge of use of DME, Impaired sensation, Improper body mechanics, Pain, Decreased mobility, Postural dysfunction, Decreased activity tolerance, Decreased endurance, Decreased range of motion, Decreased strength, Hypomobility, Decreased balance, Difficulty walking, Increased edema  Visit Diagnosis: No diagnosis found.     Problem List Patient Active Problem List   Diagnosis Date Noted  . Arthritis of right knee 07/16/2018  . Chronic pain of right knee 10/11/2017  . Prediabetes 01/02/2017  . Essential hypertension 09/04/2016    Dorene Ar, PTA 09/14/2018, 10:26 AM  [facility] [address] [place], [place], [address] Phone: [phone]   Fax:  [phone]  Name: [name] MRN: [id number] Date of Birth: 1966/05/29

## 2018-09-15 ENCOUNTER — Encounter: Payer: Self-pay | Admitting: Physical Therapy

## 2018-09-15 ENCOUNTER — Ambulatory Visit: Payer: 59 | Admitting: Physical Therapy

## 2018-09-15 DIAGNOSIS — M25562 Pain in left knee: Secondary | ICD-10-CM | POA: Diagnosis not present

## 2018-09-15 DIAGNOSIS — M25661 Stiffness of right knee, not elsewhere classified: Secondary | ICD-10-CM

## 2018-09-15 DIAGNOSIS — R2689 Other abnormalities of gait and mobility: Secondary | ICD-10-CM

## 2018-09-15 DIAGNOSIS — R600 Localized edema: Secondary | ICD-10-CM

## 2018-09-15 NOTE — Therapy (Signed)
[name], [place], [address] Phone: [phone]   Fax:  [phone]  Physical Therapy Treatment  Patient Details  Name: [name] MRN: [id number] Date of Birth: Apr 28, 1966 Referring Provider: Marybelle Killings, MD   Encounter Date: 09/15/2018  PT End of Session - 09/15/18 0950    Visit Number  12    Number of Visits  17    Date for PT Re-Evaluation  10/04/18    PT Start Time  0932    PT Stop Time  1013    PT Time Calculation (min)  41 min    Activity Tolerance  Patient tolerated treatment well    Behavior During Therapy  [name] for tasks assessed/performed       Past Medical History:  Diagnosis Date  . Arthritis of knee, right   . GERD (gastroesophageal reflux disease)   . Hypertension     Past Surgical History:  Procedure Laterality Date  . CHOLECYSTECTOMY    . TOTAL KNEE ARTHROPLASTY Right 07/16/2018  . TOTAL KNEE ARTHROPLASTY Right 07/16/2018   Procedure: RIGHT TOTAL KNEE REPLACEMENT;  Surgeon: Marvin Killings, MD;  Location: [name];  Service: Orthopedics;  Laterality: Right;    There were no vitals filed for this visit.  Subjective Assessment - 09/15/18 0936    Subjective  "I am doing pretty good, no pain, not really using the cane"     Currently in Pain?  No/denies         [name] PT Assessment - 09/15/18 7939      Observation/Other Assessments   Focus on Therapeutic Outcomes (FOTO)   28% limited      AROM   Right Knee Flexion  109   following manual treatment                  [facility] Adult PT Treatment/Exercise - 09/15/18 0936      Exercises   Exercises  Knee/Hip      Knee/Hip Exercises: Stretches   Active Hamstring Stretch  2 reps;Both;30 seconds    Quad Stretch  3 reps;30 seconds   prone with strap      Knee/Hip Exercises: Machines for Strengthening   Cybex Knee Flexion  2 x 10 20# concentric bil and eccentric with RLE only      Knee/Hip Exercises: Seated   Hamstring Curl  Right;Strengthening;2 sets;20 reps   blue theraband   Sit to Sand  15 reps;2 sets   with no resting between reps,cues to avoid weight shift     Knee/Hip Exercises: Supine   Other Supine Knee/Hip Exercises  self knee flexion with sheet in popliteal space with active hamstring curl  2 x 10 holding 5 sec      Manual Therapy   Joint Mobilization  AP grade 4 for flexion,  inferior patella mobs grade 4    Passive ROM  Flexion PROM with PT  forearm in poplitieal space. Supine extension PROM                PT Short Term Goals - 09/09/18 1016      PT SHORT TERM GOAL #1   Title  Pt will be I with HEP    Time  4    Period  Weeks    Status  Achieved      PT SHORT TERM GOAL #2   Title  Pt will decrease R knee edema to 36.5cm over the tibial tuberosity and 44.3cm 10cm proximal  to the tibial tuberosity to improve R knee ROM    Time  4    Period  Weeks    Status  Unable to assess        PT Long Term Goals - 08/09/18 1730      PT LONG TERM GOAL #1   Title  Pt will increase R knee flexion AROM to >/= 120 degrees and R knee extension AROM to </=2 degrees to improve function     Time  8    Period  Weeks    Status  New    Target Date  10/04/18      PT LONG TERM GOAL #2   Title  Pt will increase R knee strength to 5/5 to improve function and gait mechanics    Time  8    Period  Weeks    Status  New    Target Date  10/04/18      PT LONG TERM GOAL #3   Title  Pt will be able to stand with LRAD for >/= 60 min in order to perform work related duties per pt goal    Time  8    Period  Weeks    Status  New    Target Date  10/04/18      PT LONG TERM GOAL #4   Title  Pt will decrease FOTO score to </=39% limited to improve function and quality of life    Time  8    Period  Weeks    Status  New    Target Date  10/04/18            Plan - 09/15/18 1013    Clinical Impression Statement  [name] continues to progress with ROM increasing flexion to 109  following manual today. continued hip/ knee strengthening which he performed well but continued to fatigue and demonstrate compensation with weight shifting to the L. end of session he reported no pain, and declined modalities.     PT Next Visit Plan  Try manual after wram up; measure edema  for STGs ; knee ROM and strengthening as tolerated, , manual prn, GameReady prn     PT Home Exercise Plan  hamstring stretch, quad set, SAQs, heel slides, SLR, seated flexion stretch    Consulted and Agree with Plan of Care  Patient       Patient will benefit from skilled therapeutic intervention in order to improve the following deficits and impairments:  Abnormal gait, Decreased knowledge of use of DME, Impaired sensation, Improper body mechanics, Pain, Decreased mobility, Postural dysfunction, Decreased activity tolerance, Decreased endurance, Decreased range of motion, Decreased strength, Hypomobility, Decreased balance, Difficulty walking, Increased edema  Visit Diagnosis: Acute pain of left knee  Other abnormalities of gait and mobility  Localized edema  Stiffness of right knee, not elsewhere classified     Problem List Patient Active Problem List   Diagnosis Date Noted  . Arthritis of right knee 07/16/2018  . Chronic pain of right knee 10/11/2017  . Prediabetes 01/02/2017  . Essential hypertension 09/04/2016   [name] PT, DPT, LAT, ATC  09/15/18  10:15 AM      [name] [address] [place], [place], [address] Phone: [phone]   Fax:  [phone]  Name: [name] MRN: [id number] Date of Birth: 08-03-1966

## 2018-09-19 ENCOUNTER — Encounter (HOSPITAL_COMMUNITY): Payer: Self-pay

## 2018-09-19 ENCOUNTER — Emergency Department (HOSPITAL_COMMUNITY)
Admission: EM | Admit: 2018-09-19 | Discharge: 2018-09-19 | Disposition: A | Discharge status: Home / Self Care | Payer: 59 | Attending: Emergency Medicine | Admitting: Emergency Medicine

## 2018-09-19 DIAGNOSIS — I10 Essential (primary) hypertension: Secondary | ICD-10-CM | POA: Diagnosis not present

## 2018-09-19 DIAGNOSIS — Z96651 Presence of right artificial knee joint: Secondary | ICD-10-CM | POA: Diagnosis not present

## 2018-09-19 DIAGNOSIS — Z79899 Other long term (current) drug therapy: Secondary | ICD-10-CM | POA: Insufficient documentation

## 2018-09-19 DIAGNOSIS — Z87891 Personal history of nicotine dependence: Secondary | ICD-10-CM | POA: Insufficient documentation

## 2018-09-19 DIAGNOSIS — Y9389 Activity, other specified: Secondary | ICD-10-CM | POA: Diagnosis not present

## 2018-09-19 DIAGNOSIS — S39012A Strain of muscle, fascia and tendon of lower back, initial encounter: Secondary | ICD-10-CM | POA: Insufficient documentation

## 2018-09-19 DIAGNOSIS — Z7982 Long term (current) use of aspirin: Secondary | ICD-10-CM | POA: Insufficient documentation

## 2018-09-19 DIAGNOSIS — Y929 Unspecified place or not applicable: Secondary | ICD-10-CM | POA: Diagnosis not present

## 2018-09-19 DIAGNOSIS — Y999 Unspecified external cause status: Secondary | ICD-10-CM | POA: Diagnosis not present

## 2018-09-19 MED ORDER — CYCLOBENZAPRINE HCL 10 MG PO TABS: 10.0000 mg | ORAL_TABLET | Freq: Two times a day (BID) | ORAL | 0 refills | Status: DC | PRN
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
## 2018-09-20 ENCOUNTER — Ambulatory Visit: Payer: 59 | Admitting: Physical Therapy

## 2018-09-20 ENCOUNTER — Encounter: Payer: Self-pay | Admitting: Physical Therapy

## 2018-09-20 DIAGNOSIS — M25562 Pain in left knee: Secondary | ICD-10-CM

## 2018-09-20 DIAGNOSIS — M25661 Stiffness of right knee, not elsewhere classified: Secondary | ICD-10-CM

## 2018-09-20 DIAGNOSIS — R600 Localized edema: Secondary | ICD-10-CM

## 2018-09-20 DIAGNOSIS — R2689 Other abnormalities of gait and mobility: Secondary | ICD-10-CM

## 2018-09-20 MED FILL — CYCLOBENZAPRINE 10 MG TAB: 10 | 10 days supply | Qty: 20 | Fill #0

## 2018-09-20 NOTE — Therapy (Signed)
[place], [place], [address] Phone: [phone]   Fax:  [phone]  Physical Therapy Treatment  Patient Details  Name: [name] MRN: [id number] Date of Birth: 07-08-1966 Referring Provider: Marybelle Killings, MD   Encounter Date: 09/20/2018  PT End of Session - 09/20/18 0952    Visit Number  13    Number of Visits  17    Date for PT Re-Evaluation  10/04/18    PT Start Time  0930    PT Stop Time  1015    PT Time Calculation (min)  45 min       Past Medical History:  Diagnosis Date  . Arthritis of knee, right   . GERD (gastroesophageal reflux disease)   . Hypertension     Past Surgical History:  Procedure Laterality Date  . CHOLECYSTECTOMY    . TOTAL KNEE ARTHROPLASTY Right 07/16/2018  . TOTAL KNEE ARTHROPLASTY Right 07/16/2018   Procedure: RIGHT TOTAL KNEE REPLACEMENT;  Surgeon: Marybelle Killings, MD;  Location: [facility];  Service: Orthopedics;  Laterality: Right;    There were no vitals filed for this visit.  Subjective Assessment - 09/20/18 0951    Subjective  Doing good. I wish the swelling would go down.     Currently in Pain?  No/denies         [facility] PT Assessment - 09/20/18 0001      Circumferential Edema   Circumferential - Right  over tibial tuberosity 39.5 cm; 10 cm proximal to tibial tubosity 45.5 cm      AROM   Right Knee Flexion  108                   [facility] Adult PT Treatment/Exercise - 09/20/18 0001      Knee/Hip Exercises: Stretches   Active Hamstring Stretch  2 reps;Both;30 seconds    Quad Stretch  3 reps;30 seconds   prone with strap    Gastroc Stretch  3 reps;30 seconds   counter     Knee/Hip Exercises: Aerobic   Nustep  Nustep L5 LE only       Knee/Hip Exercises: Machines for Strengthening   Cybex Knee Extension  5# 2 x 10 RLE     Cybex Knee Flexion  2 x 10 20# concentric bil and eccentric with RLE only    Total Gym Leg Press  2 plates bilateral ,  1 plate single leg    needs intermittent assist from LLE     Knee/Hip Exercises: Standing   Heel Raises  20 reps   10 reps single leg   Lateral Step Up  Step Height: 6";Hand Hold: 0    Forward Step Up  Step Height: 6";Hand Hold: 0      Knee/Hip Exercises: Seated   Sit to [employer]  10 reps               PT Short Term Goals - 09/20/18 1124      PT SHORT TERM GOAL #1   Title  Pt will be I with HEP    Time  4    Period  Weeks    Status  Achieved      PT SHORT TERM GOAL #2   Title  Pt will decrease R knee edema to 36.5cm over the tibial tuberosity and 44.3cm 10cm proximal to the tibial tuberosity to improve R knee ROM    Baseline  see objective measrures  Time  4    Period  Weeks    Status  On-going        PT Long Term Goals - 09/20/18 1124      PT LONG TERM GOAL #1   Title  Pt will increase R knee flexion AROM to >/= 120 degrees and R knee extension AROM to </=2 degrees to improve function     Baseline  109 right knee flexion max     Time  8    Period  Weeks    Status  Partially Met      PT LONG TERM GOAL #2   Title  Pt will increase R knee strength to 5/5 to improve function and gait mechanics    Time  8    Period  Weeks    Status  On-going      PT LONG TERM GOAL #3   Title  Pt will be able to stand with LRAD for >/= 60 min in order to perform work related duties per pt goal    Time  8    Period  Weeks    Status  Unable to assess      PT LONG TERM GOAL #4   Title  Pt will decrease FOTO score to </=39% limited to improve function and quality of life    Baseline  28% limited     Time  8    Period  Weeks    Status  Achieved            Plan - 09/20/18 1048    Clinical Impression Statement  Continued with RLE strengthening and knee ROM. No cane and mild antaligic gait present. Still favors the right knee and needs cues to increase RLE weight bearing in closed chain. AROM 108 after stretching and without manual. Edema improved. MET FOTO goal.     PT  Next Visit Plan  Try manual after wram up; measure edema  for STGs ; knee ROM and strengthening as tolerated, , manual prn, GameReady prn     PT Home Exercise Plan  hamstring stretch, quad set, SAQs, heel slides, SLR, seated flexion stretch    Consulted and Agree with Plan of Care  Patient       Patient will benefit from skilled therapeutic intervention in order to improve the following deficits and impairments:  Abnormal gait, Decreased knowledge of use of DME, Impaired sensation, Improper body mechanics, Pain, Decreased mobility, Postural dysfunction, Decreased activity tolerance, Decreased endurance, Decreased range of motion, Decreased strength, Hypomobility, Decreased balance, Difficulty walking, Increased edema  Visit Diagnosis: Acute pain of left knee  Other abnormalities of gait and mobility  Localized edema  Stiffness of right knee, not elsewhere classified     Problem List Patient Active Problem List   Diagnosis Date Noted  . Arthritis of right knee 07/16/2018  . Chronic pain of right knee 10/11/2017  . Prediabetes 01/02/2017  . Essential hypertension 09/04/2016    Dorene Ar, PTA 09/20/2018, 11:28 AM  [facility] [address] [place], [place], [address] Phone: [phone]   Fax:  [phone]  Name: [name] MRN: [id number] Date of Birth: January 26, 1966

## 2018-09-22 ENCOUNTER — Encounter: Payer: Self-pay | Admitting: Physical Therapy

## 2018-09-22 ENCOUNTER — Ambulatory Visit: Payer: 59 | Admitting: Physical Therapy

## 2018-09-22 DIAGNOSIS — M25562 Pain in left knee: Secondary | ICD-10-CM

## 2018-09-22 DIAGNOSIS — R2689 Other abnormalities of gait and mobility: Secondary | ICD-10-CM

## 2018-09-22 DIAGNOSIS — M25661 Stiffness of right knee, not elsewhere classified: Secondary | ICD-10-CM

## 2018-09-22 DIAGNOSIS — R600 Localized edema: Secondary | ICD-10-CM

## 2018-09-22 NOTE — Therapy (Signed)
[place], [place], [address] Phone: [phone]   Fax:  [phone]  Physical Therapy Treatment  Patient Details  Name: [name] MRN: [id number] Date of Birth: 1966/09/02 Referring Provider: Marybelle Killings, MD   Encounter Date: 09/22/2018  PT End of Session - 09/22/18 0934    Visit Number  14    Number of Visits  17    Date for PT Re-Evaluation  10/04/18    PT Start Time  0934    PT Stop Time  1013    PT Time Calculation (min)  39 min    Activity Tolerance  Patient tolerated treatment well    Behavior During Therapy  [facility] for tasks assessed/performed       Past Medical History:  Diagnosis Date  . Arthritis of knee, right   . GERD (gastroesophageal reflux disease)   . Hypertension     Past Surgical History:  Procedure Laterality Date  . CHOLECYSTECTOMY    . TOTAL KNEE ARTHROPLASTY Right 07/16/2018  . TOTAL KNEE ARTHROPLASTY Right 07/16/2018   Procedure: RIGHT TOTAL KNEE REPLACEMENT;  Surgeon: Marybelle Killings, MD;  Location: [facility];  Service: Orthopedics;  Laterality: Right;    There were no vitals filed for this visit.  Subjective Assessment - 09/22/18 0935    Subjective  " I am feeling alittle more sore today, I hasn't been bothering me really that much though"     Currently in Pain?  Yes    Pain Score  2     Pain Orientation  Right    Pain Descriptors / Indicators  Aching    Pain Type  Surgical pain    Aggravating Factors   bending the knee    Pain Relieving Factors  changing position, ice, meds         [facility] PT Assessment - 09/22/18 0955      AROM   Right Knee Flexion  109                   [facility] Adult PT Treatment/Exercise - 09/22/18 0936      Knee/Hip Exercises: Stretches   Active Hamstring Stretch  2 reps;30 seconds    Quad Stretch  2 reps;30 seconds;Other (comment)   in prone with strap     Knee/Hip Exercises: Aerobic   Elliptical  L1 x 4 min elevation L1     Recumbent Bike  L3 x 6 min    lowering seat every 2 min ti increase flexion     Knee/Hip Exercises: Machines for Strengthening   Cybex Knee Flexion  --      Knee/Hip Exercises: Standing   Forward Lunges  2 sets;10 reps   touching knee onto bosu   Forward Lunges Limitations  tactile cues required to promote proper lunge form      Knee/Hip Exercises: Seated   Hamstring Curl  Right;Strengthening;2 sets;20 reps   with green theraband     Manual Therapy   Manual Therapy  Other (comment)    Manual therapy comments  MTPR along the rectus femoris x 3    Joint Mobilization  AP Grade 4 in supine gradually sliding heel farther to promote increased motion    Passive ROM  R knee flexion with Lknee extension for to  promote reciprocal inhibition 2 x 10    Other Manual Therapy  DTM using rolling along quad             PT  Education - 09/22/18 1013    Education Details  benefits of MTPR     Person(s) Educated  Patient    Methods  Explanation;Verbal cues;Handout    Comprehension  Verbalized understanding;Verbal cues required       PT Short Term Goals - 09/20/18 1124      PT SHORT TERM GOAL #1   Title  Pt will be I with HEP    Time  4    Period  Weeks    Status  Achieved      PT SHORT TERM GOAL #2   Title  Pt will decrease R knee edema to 36.5cm over the tibial tuberosity and 44.3cm 10cm proximal to the tibial tuberosity to improve R knee ROM    Baseline  see objective measrures     Time  4    Period  Weeks    Status  On-going        PT Long Term Goals - 09/20/18 1124      PT LONG TERM GOAL #1   Title  Pt will increase R knee flexion AROM to >/= 120 degrees and R knee extension AROM to </=2 degrees to improve function     Baseline  109 right knee flexion max     Time  8    Period  Weeks    Status  Partially Met      PT LONG TERM GOAL #2   Title  Pt will increase R knee strength to 5/5 to improve function and gait mechanics    Time  8    Period  Weeks    Status   On-going      PT LONG TERM GOAL #3   Title  Pt will be able to stand with LRAD for >/= 60 min in order to perform work related duties per pt goal    Time  8    Period  Weeks    Status  Unable to assess      PT LONG TERM GOAL #4   Title  Pt will decrease FOTO score to </=39% limited to improve function and quality of life    Baseline  28% limited     Time  8    Period  Weeks    Status  Achieved            Plan - 09/22/18 1013    Clinical Impression Statement  [name] continues to report swelling and soreness only with bending. He continues to demonstrate 109 degres of flexion. continued manual to increase ROM and STW along the quad. progress strength to lunges which he performed well with minimal postural sway. end of session he reported no increase in pain and declined modalities.     PT Next Visit Plan  Try manual after wram up; measure edema  for STGs ; knee ROM and strengthening as tolerated, , manual prn, GameReady prn     PT Home Exercise Plan  hamstring stretch, quad set, SAQs, heel slides, SLR, seated flexion stretch    Consulted and Agree with Plan of Care  Patient       Patient will benefit from skilled therapeutic intervention in order to improve the following deficits and impairments:  Abnormal gait, Decreased knowledge of use of DME, Impaired sensation, Improper body mechanics, Pain, Decreased mobility, Postural dysfunction, Decreased activity tolerance, Decreased endurance, Decreased range of motion, Decreased strength, Hypomobility, Decreased balance, Difficulty walking, Increased edema  Visit Diagnosis: Acute pain of left knee  Other  abnormalities of gait and mobility  Localized edema  Stiffness of right knee, not elsewhere classified     Problem List Patient Active Problem List   Diagnosis Date Noted  . Arthritis of right knee 07/16/2018  . Chronic pain of right knee 10/11/2017  . Prediabetes 01/02/2017  . Essential hypertension 09/04/2016    [place] PT, DPT, LAT, ATC  09/22/18  10:16 AM      [place] [facility] [address] [place], [place], [address] Phone: [phone]   Fax:  [phone]  Name: [name] MRN: [id number] Date of Birth: March 17, 1966

## 2018-09-27 ENCOUNTER — Ambulatory Visit: Payer: 59 | Admitting: Physical Therapy

## 2018-09-27 ENCOUNTER — Encounter: Payer: Self-pay | Admitting: Physical Therapy

## 2018-09-27 ENCOUNTER — Ambulatory Visit: Payer: 59 | Admitting: Family Medicine

## 2018-09-27 DIAGNOSIS — R2689 Other abnormalities of gait and mobility: Secondary | ICD-10-CM

## 2018-09-27 DIAGNOSIS — M25661 Stiffness of right knee, not elsewhere classified: Secondary | ICD-10-CM

## 2018-09-27 DIAGNOSIS — M25562 Pain in left knee: Secondary | ICD-10-CM

## 2018-09-27 DIAGNOSIS — R600 Localized edema: Secondary | ICD-10-CM

## 2018-09-27 NOTE — Therapy (Signed)
[name], [place], [address] Phone: [phone]   Fax:  [phone]  Physical Therapy Treatment  Patient Details  Name: [name] MRN: [id number] Date of Birth: 08-19-1966 Referring Provider (PT): Marvin Killings, MD   Encounter Date: 09/27/2018  PT End of Session - 09/27/18 0938    Visit Number  15    Number of Visits  17    Date for PT Re-Evaluation  10/04/18    PT Start Time  0930    PT Stop Time  1287    PT Time Calculation (min)  45 min       Past Medical History:  Diagnosis Date  . Arthritis of knee, right   . GERD (gastroesophageal reflux disease)   . Hypertension     Past Surgical History:  Procedure Laterality Date  . CHOLECYSTECTOMY    . TOTAL KNEE ARTHROPLASTY Right 07/16/2018  . TOTAL KNEE ARTHROPLASTY Right 07/16/2018   Procedure: RIGHT TOTAL KNEE REPLACEMENT;  Surgeon: Marvin Killings, MD;  Location: [facility];  Service: Orthopedics;  Laterality: Right;    There were no vitals filed for this visit.  Subjective Assessment - 09/27/18 0935    Subjective  Sometimes I cant do certain things at home that require squatting but I can kneel to get shoes out from under bed. Otherwise, nothing else I have trouble doing at home.     Currently in Pain?  No/denies         [facility] PT Assessment - 09/27/18 0001      AROM   Right Knee Flexion  111   114 at end of session     PROM   Right Knee Flexion  118   end of session                  [facility] Adult PT Treatment/Exercise - 09/27/18 0001      Knee/Hip Exercises: Stretches   Quad Stretch  2 reps;30 seconds;Other (comment)   in prone with strap     Knee/Hip Exercises: Aerobic   Nustep  L5 LE only       Knee/Hip Exercises: Machines for Strengthening   Total Gym Leg Press  2 plates bilateral , 1 plate single leg    needs intermittent assist from LLE     Knee/Hip Exercises: Standing   Forward Lunges  2 sets;10 reps    touching knee onto bosu   Forward Lunges Limitations  tactile cues required to promote proper lunge form      Knee/Hip Exercises: Seated   Hamstring Limitations  stool scoot 30 ft x 2       Knee/Hip Exercises: Prone   Hamstring Curl  2 sets;10 reps   5#     Manual Therapy   Joint Mobilization  AP Grade 3 in supine gradually sliding heel farther to promote increased motion    Passive ROM  R knee flexion in supine and prone               PT Short Term Goals - 09/20/18 1124      PT SHORT TERM GOAL #1   Title  Pt will be I with HEP    Time  4    Period  Weeks    Status  Achieved      PT SHORT TERM GOAL #2   Title  Pt will decrease R knee edema to 36.5cm over the tibial tuberosity and 44.3cm 10cm proximal to  the tibial tuberosity to improve R knee ROM    Baseline  see objective measrures     Time  4    Period  Weeks    Status  On-going        PT Long Term Goals - 09/27/18 1018      PT LONG TERM GOAL #1   Title  Pt will increase R knee flexion AROM to >/= 120 degrees and R knee extension AROM to </=2 degrees to improve function     Baseline  111 AROM, 118 PROM    Time  8    Period  Weeks    Status  Partially Met      PT LONG TERM GOAL #2   Title  Pt will increase R knee strength to 5/5 to improve function and gait mechanics    Time  8    Period  Weeks    Status  Unable to assess      PT LONG TERM GOAL #3   Title  Pt will be able to stand with LRAD for >/= 60 min in order to perform work related duties per pt goal    Time  8    Period  Weeks    Status  Achieved      PT LONG TERM GOAL #4   Title  Pt will decrease FOTO score to </=39% limited to improve function and quality of life    Baseline  28% limited     Time  8    Period  Weeks    Status  Achieved            Plan - 09/27/18 0939    Clinical Impression Statement  Pt reports he was able to stand/walk at soccer game for 1.5 hours without increased pain or difficulty. He cannot fully squat to  get shoes out from under his bed but he does report being able to kneel and get back up indpendently at home. He as a couple more visits in his POC and will discuss discharge with Primary PT next visit.     PT Next Visit Plan  measure edema, discharge planning, finalize HEP     PT Home Exercise Plan  hamstring stretch, quad set, SAQs, heel slides, SLR, seated flexion stretch       Patient will benefit from skilled therapeutic intervention in order to improve the following deficits and impairments:  Abnormal gait, Decreased knowledge of use of DME, Impaired sensation, Improper body mechanics, Pain, Decreased mobility, Postural dysfunction, Decreased activity tolerance, Decreased endurance, Decreased range of motion, Decreased strength, Hypomobility, Decreased balance, Difficulty walking, Increased edema  Visit Diagnosis: Acute pain of left knee  Other abnormalities of gait and mobility  Localized edema  Stiffness of right knee, not elsewhere classified     Problem List Patient Active Problem List   Diagnosis Date Noted  . Arthritis of right knee 07/16/2018  . Chronic pain of right knee 10/11/2017  . Prediabetes 01/02/2017  . Essential hypertension 09/04/2016    Dorene Ar, PTA 09/27/2018, 11:45 AM  [facility] [address] [place], [place], [address] Phone: [phone]   Fax:  [phone]  Name: [name] MRN: [id number] Date of Birth: 09/14/1966

## 2018-09-29 ENCOUNTER — Ambulatory Visit: Payer: 59 | Attending: Orthopaedic Surgery | Admitting: Physical Therapy

## 2018-09-29 ENCOUNTER — Encounter: Payer: Self-pay | Admitting: Physical Therapy

## 2018-09-29 DIAGNOSIS — M25562 Pain in left knee: Secondary | ICD-10-CM | POA: Diagnosis present

## 2018-09-29 DIAGNOSIS — M25661 Stiffness of right knee, not elsewhere classified: Secondary | ICD-10-CM | POA: Insufficient documentation

## 2018-09-29 DIAGNOSIS — R600 Localized edema: Secondary | ICD-10-CM | POA: Diagnosis present

## 2018-09-29 DIAGNOSIS — R2689 Other abnormalities of gait and mobility: Secondary | ICD-10-CM | POA: Insufficient documentation

## 2018-09-29 NOTE — Therapy (Signed)
[place], [place], [address] Phone: [phone]   Fax:  [phone]  Physical Therapy Treatment  Patient Details  Name: [name] MRN: [id number] Date of Birth: 20-Sep-1966 Referring Provider (PT): Marybelle Killings, MD   Encounter Date: 09/29/2018  PT End of Session - 09/29/18 0953    Visit Number  16    Number of Visits  17    Date for PT Re-Evaluation  10/04/18    PT Start Time  0933    PT Stop Time  1012    PT Time Calculation (min)  39 min    Activity Tolerance  Patient tolerated treatment well    Behavior During Therapy  [facility] for tasks assessed/performed       Past Medical History:  Diagnosis Date  . Arthritis of knee, right   . GERD (gastroesophageal reflux disease)   . Hypertension     Past Surgical History:  Procedure Laterality Date  . CHOLECYSTECTOMY    . TOTAL KNEE ARTHROPLASTY Right 07/16/2018  . TOTAL KNEE ARTHROPLASTY Right 07/16/2018   Procedure: RIGHT TOTAL KNEE REPLACEMENT;  Surgeon: Marybelle Killings, MD;  Location: [facility];  Service: Orthopedics;  Laterality: Right;    There were no vitals filed for this visit.  Subjective Assessment - 09/29/18 0936    Subjective  "doing better, still no pain and feel like I am getting better with the squatting"    Patient Stated Goals  get knee to 100% so pt can go back to work    Currently in Pain?  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     Person(s) Educated  Patient    Methods  Explanation;Verbal cues;Handout;Demonstration    Comprehension  Verbalized understanding;Verbal cues required;Returned demonstration       PT Short Term Goals - 09/20/18 1124      PT SHORT TERM GOAL #1   Title  Pt will be I with HEP    Time  4  Period  Weeks    Status  Achieved      PT SHORT TERM GOAL #2   Title  Pt will decrease R knee edema to 36.5cm over the tibial tuberosity and 44.3cm 10cm proximal to the tibial tuberosity to improve R knee ROM    Baseline  see objective measrures     Time  4    Period  Weeks    Status  On-going        PT Long Term Goals - 09/27/18 1018      PT LONG TERM GOAL #1   Title  Pt will increase R knee flexion AROM to >/= 120 degrees and R knee extension AROM to </=2 degrees to improve function     Baseline  111 AROM, 118 PROM    Time  8    Period  Weeks    Status  Partially Met      PT LONG TERM GOAL #2   Title  Pt will increase R knee strength to 5/5 to improve function and gait mechanics    Time  8    Period  Weeks    Status  Unable to assess      PT LONG TERM GOAL #3   Title  Pt will be able to stand with LRAD for >/= 60 min in order to perform work related duties per pt goal    Time  8    Period  Weeks    Status  Achieved      PT LONG TERM GOAL #4   Title  Pt will decrease FOTO score to </=39% limited to improve function and quality of life    Baseline  28% limited     Time  8     Period  Weeks    Status  Achieved            Plan - 09/29/18 0954    Clinical Impression Statement  [name] continues to make gradual progress with knee flexion increasing active Range to 111 degrees. plan to drop scheduling down to 1 x a week to on functional and work related activities. He performed activities well reporting no pain and declined modalities end of session.     PT Treatment/Interventions  ADLs/Self Care Home Management;Cryotherapy;Electrical Stimulation;Iontophoresis '4mg'$ /ml Dexamethasone;Moist Heat;Ultrasound;DME Instruction;Gait training;Stair training;Functional mobility training;Therapeutic activities;Therapeutic exercise;Balance training;Neuromuscular re-education;Patient/family education;Manual techniques;Scar mobilization;Passive range of motion;Dry needling;Energy conservation;Taping;Vasopneumatic Device;Visual/perceptual remediation/compensation    PT Next Visit Plan  functional lifting, and work related tasks, discharge in the next 2 visits finalize HEP     PT Home Exercise Plan  hamstring stretch, quad set, SAQs, heel slides, SLR, seated flexion stretch    Consulted and Agree with Plan of Care  Patient       Patient will benefit from skilled therapeutic intervention in order to improve the following deficits and impairments:  Abnormal gait, Decreased knowledge of use of DME, Impaired sensation, Improper body mechanics, Pain, Decreased mobility, Postural dysfunction, Decreased activity tolerance, Decreased endurance, Decreased range of motion, Decreased strength, Hypomobility, Decreased balance, Difficulty walking, Increased edema  Visit Diagnosis: Acute pain of left knee  Other abnormalities of gait and mobility  Localized edema  Stiffness of right knee, not elsewhere classified     Problem List Patient Active Problem List   Diagnosis Date Noted  . Arthritis of right knee 07/16/2018  . Chronic pain of right knee 10/11/2017  . Prediabetes  01/02/2017  . Essential hypertension 09/04/2016   [name] PT, DPT, LAT,  ATC  09/29/18  10:17 AM      [place], [place], [address] Phone: [phone]   Fax:  [phone]  Name: [name] MRN: [id number] Date of Birth: [birth year]

## 2018-09-30 ENCOUNTER — Encounter: Payer: Self-pay | Admitting: Family Medicine

## 2018-10-01 MED FILL — AMLODIPINE BESYLATE 10 MG T: 10 | 30 days supply | Qty: 30 | Fill #5

## 2018-10-04 ENCOUNTER — Ambulatory Visit: Payer: 59 | Admitting: Physical Therapy

## 2018-10-05 ENCOUNTER — Telehealth: Payer: Self-pay

## 2018-10-05 NOTE — Telephone Encounter (Signed)
Called, no answer. Left a message to remind patient of appointment on 10/07/2018. Thanks!

## 2018-10-06 ENCOUNTER — Ambulatory Visit: Payer: 59 | Admitting: Physical Therapy

## 2018-10-07 ENCOUNTER — Ambulatory Visit (INDEPENDENT_AMBULATORY_CARE_PROVIDER_SITE_OTHER): Payer: 59 | Admitting: Family Medicine

## 2018-10-07 ENCOUNTER — Encounter: Payer: Self-pay | Admitting: Family Medicine

## 2018-10-07 VITALS — BP 144/80 | HR 86 | Temp 98.6°F | Resp 16 | Ht 70.0 in | Wt 260.0 lb

## 2018-10-07 DIAGNOSIS — M5489 Other dorsalgia: Secondary | ICD-10-CM | POA: Diagnosis not present

## 2018-10-07 DIAGNOSIS — R7303 Prediabetes: Secondary | ICD-10-CM | POA: Diagnosis not present

## 2018-10-07 DIAGNOSIS — I10 Essential (primary) hypertension: Secondary | ICD-10-CM

## 2018-10-07 DIAGNOSIS — Z23 Encounter for immunization: Secondary | ICD-10-CM

## 2018-10-07 DIAGNOSIS — M549 Dorsalgia, unspecified: Secondary | ICD-10-CM

## 2018-10-07 DIAGNOSIS — Z1211 Encounter for screening for malignant neoplasm of colon: Secondary | ICD-10-CM

## 2018-10-07 LAB — POCT GLYCOSYLATED HEMOGLOBIN (HGB A1C): Hemoglobin A1C: 5.3 % (ref 4.0–5.6)

## 2018-10-07 MED ORDER — IBUPROFEN 600 MG PO TABS: 600.0000 mg | ORAL_TABLET | Freq: Three times a day (TID) | ORAL | 0 refills | Status: DC | PRN

## 2018-10-07 NOTE — Patient Instructions (Signed)
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
## 2018-10-08 ENCOUNTER — Ambulatory Visit: Payer: 59 | Admitting: Family Medicine

## 2018-10-08 LAB — URINALYSIS, ROUTINE W REFLEX MICROSCOPIC
Bilirubin, UA: NEGATIVE
Glucose, UA: NEGATIVE
Ketones, UA: NEGATIVE
Leukocytes, UA: NEGATIVE
Nitrite, UA: NEGATIVE
Protein, UA: NEGATIVE
RBC, UA: NEGATIVE
Specific Gravity, UA: 1.018 (ref 1.005–1.030)
Urobilinogen, Ur: 0.2 mg/dL (ref 0.2–1.0)
pH, UA: 6 (ref 5.0–7.5)

## 2018-10-11 ENCOUNTER — Encounter: Payer: 59 | Admitting: Physical Therapy

## 2018-10-12 ENCOUNTER — Encounter: Payer: Self-pay | Admitting: Gastroenterology

## 2018-10-13 ENCOUNTER — Ambulatory Visit: Payer: 59 | Admitting: Physical Therapy

## 2018-10-13 ENCOUNTER — Encounter: Payer: Self-pay | Admitting: Physical Therapy

## 2018-10-13 DIAGNOSIS — M25562 Pain in left knee: Secondary | ICD-10-CM | POA: Diagnosis not present

## 2018-10-13 DIAGNOSIS — R2689 Other abnormalities of gait and mobility: Secondary | ICD-10-CM

## 2018-10-13 DIAGNOSIS — R600 Localized edema: Secondary | ICD-10-CM

## 2018-10-13 DIAGNOSIS — M25661 Stiffness of right knee, not elsewhere classified: Secondary | ICD-10-CM

## 2018-10-13 NOTE — Therapy (Signed)
[place], [place], [address] Phone: [phone]   Fax:  [phone]  Physical Therapy Treatment / Discharge Summary  Patient Details  Name: [name] MRN: [id number] Date of Birth: December 06, 1966 Referring Provider (PT): Marybelle Killings, MD   Encounter Date: 10/13/2018  PT End of Session - 10/13/18 0936    Visit Number  17    Number of Visits  17    Date for PT Re-Evaluation  10/13/18    PT Start Time  0931    PT Stop Time  1009    PT Time Calculation (min)  38 min    Activity Tolerance  Patient tolerated treatment well    Behavior During Therapy  [facility] for tasks assessed/performed       Past Medical History:  Diagnosis Date  . Arthritis of knee, right   . GERD (gastroesophageal reflux disease)   . Hypertension     Past Surgical History:  Procedure Laterality Date  . CHOLECYSTECTOMY    . TOTAL KNEE ARTHROPLASTY Right 07/16/2018  . TOTAL KNEE ARTHROPLASTY Right 07/16/2018   Procedure: RIGHT TOTAL KNEE REPLACEMENT;  Surgeon: Marybelle Killings, MD;  Location: [facility];  Service: Orthopedics;  Laterality: Right;    There were no vitals filed for this visit.  Subjective Assessment - 10/13/18 0932    Subjective  "Doing good, no problems, bending is about the same but I am still working on it at home"     Currently in Pain?  No/denies    Pain Location  Knee    Pain Orientation  Left    Aggravating Factors   N/A    Pain Relieving Factors  changing positions, ice, meds,          [facility] PT Assessment - 10/13/18 0941      Observation/Other Assessments   Focus on Therapeutic Outcomes (FOTO)   25% limited      AROM   Right Knee Extension  6    Right Knee Flexion  116      Strength   Right Knee Flexion  5/5    Right Knee Extension  5/5                   [facility] Adult PT Treatment/Exercise - 10/13/18 0943      Therapeutic Activites    Work Simulation  pushing sled back and forth 4 x 30  ft with 25#,  lifting 25# from floor to waist using good form 2 x 10      Knee/Hip Exercises: Aerobic   Elliptical  L3 x 5 min elevation L 3      Knee/Hip Exercises: Supine   Heel Slides  AROM;10 reps;2 sets;Right   with strap            PT Education - 10/13/18 1003    Education Details  reviewed previously provided HEP. benefits of increasing reps to promote endurance so when he returns to work he doesn't fatigue as quickly.     Person(s) Educated  Patient    Methods  Explanation;Verbal cues    Comprehension  Verbalized understanding;Verbal cues required       PT Short Term Goals - 10/13/18 0948      PT SHORT TERM GOAL #1   Title  Pt will be I with HEP    Status  Achieved      PT SHORT TERM GOAL #2   Status  Achieved  PT Long Term Goals - 10/13/18 0948      PT LONG TERM GOAL #1   Title  Pt will increase R knee flexion AROM to >/= 120 degrees and R knee extension AROM to </=2 degrees to improve function     Baseline  flexion 116, extension 6 degrees    Period  Weeks    Status  Partially Met      PT LONG TERM GOAL #2   Title  Pt will increase R knee strength to 5/5 to improve function and gait mechanics    Time  8    Period  Weeks    Status  Achieved      PT LONG TERM GOAL #3   Title  Pt will be able to stand with LRAD for >/= 60 min in order to perform work related duties per pt goal    Time  8    Period  Weeks    Status  Achieved      PT LONG TERM GOAL #4   Title  Pt will decrease FOTO score to </=39% limited to improve function and quality of life    Time  8    Period  Weeks    Status  Achieved            Plan - 10/13/18 1007    Clinical Impression Statement  Mr Hosein has made great progress with physical therapy increase knee ROM and strength. he continues to demonstrate mild limtation of ROM of 6 degrees extension and 116 flexion with no report of pain. He was able to perform all exercise and functional lifting and pushing  demonstrating good form/ posture with no pain. he met or partially met all goals today. He is able to maintain and progress his current level of function independently and will be discharged from PT today.     PT Next Visit Plan  d/c    Consulted and Agree with Plan of Care  Patient       Patient will benefit from skilled therapeutic intervention in order to improve the following deficits and impairments:  Abnormal gait, Decreased knowledge of use of DME, Impaired sensation, Improper body mechanics, Pain, Decreased mobility, Postural dysfunction, Decreased activity tolerance, Decreased endurance, Decreased range of motion, Decreased strength, Hypomobility, Decreased balance, Difficulty walking, Increased edema  Visit Diagnosis: Acute pain of left knee  Other abnormalities of gait and mobility  Localized edema  Stiffness of right knee, not elsewhere classified     Problem List Patient Active Problem List   Diagnosis Date Noted  . Arthritis of right knee 07/16/2018  . Chronic pain of right knee 10/11/2017  . Prediabetes 01/02/2017  . Essential hypertension 09/04/2016    [place] 10/13/2018, 10:10 AM  [facility] [address] [place], [place], [address] Phone: [phone]   Fax:  [phone]  Name: [name] MRN: [id number] Date of Birth: June 07, 1966        PHYSICAL THERAPY DISCHARGE SUMMARY  Visits from Start of Care: 17  Current functional level related to goals / functional outcomes: See goals, FOTO 25% limited   Remaining deficits: Mild limitation of R knee ROM compared bil, but reported no impact on overall function   Education / Equipment: HEP, posture, theraband  Plan: Patient agrees to discharge.  Patient goals were partially met. Patient is being discharged due to being pleased with the current functional level.  ?????  Severo Beber PT, DPT, LAT, ATC  10/13/18   10:11 AM

## 2018-10-15 ENCOUNTER — Ambulatory Visit (INDEPENDENT_AMBULATORY_CARE_PROVIDER_SITE_OTHER): Payer: 59 | Admitting: Orthopaedic Surgery

## 2018-10-15 ENCOUNTER — Encounter (INDEPENDENT_AMBULATORY_CARE_PROVIDER_SITE_OTHER): Payer: Self-pay | Admitting: Orthopaedic Surgery

## 2018-10-15 ENCOUNTER — Ambulatory Visit (INDEPENDENT_AMBULATORY_CARE_PROVIDER_SITE_OTHER): Payer: 59

## 2018-10-15 VITALS — BP 156/89 | HR 82 | Ht 70.0 in | Wt 240.0 lb

## 2018-10-15 DIAGNOSIS — Z96651 Presence of right artificial knee joint: Secondary | ICD-10-CM | POA: Diagnosis not present

## 2018-10-15 NOTE — Progress Notes (Signed)
   Post-Op Visit Note   Patient: MARYLAND LUPPINO           Date of Birth: 11-Sep-1966           MRN: [id number] Visit Date: 10/15/2018 PCP: Lanae Boast, FNP   Assessment & Plan: Post right total knee arthroplasty.  Chief Complaint:  Chief Complaint  Patient presents with  . Right Knee - Follow-up    07/16/18 Right TKA   Visit Diagnoses:  1. Status post total knee replacement, right     Plan: Just finished outpatient therapy.  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  He has full extension flex to 120 degrees but still has to hop up when he gets up on a step using his new knee arthroplasty on the right.  Continue strengthening recheck 1 month.  Work slip given no work x1 month.  Follow-Up Instructions: No follow-ups on file.   Orders:  Orders Placed This Encounter  Procedures  . XR Knee 1-2 Views Right   No orders of the defined types were placed in this encounter.   Imaging: No results found.  PMFS History: Patient Active Problem List   Diagnosis Date Noted  . Arthritis of right knee 07/16/2018  . Chronic pain of right knee 10/11/2017  . Prediabetes 01/02/2017  . Essential hypertension 09/04/2016   Past Medical History:  Diagnosis Date  . Arthritis of knee, right   . GERD (gastroesophageal reflux disease)   . Hypertension     Family History  Problem Relation Age of Onset  . Cancer Father        prostate   . Cancer Maternal Grandfather        prostate    Past Surgical History:  Procedure Laterality Date  . CHOLECYSTECTOMY    . TOTAL KNEE ARTHROPLASTY Right 07/16/2018  . TOTAL KNEE ARTHROPLASTY Right 07/16/2018   Procedure: RIGHT TOTAL KNEE REPLACEMENT;  Surgeon: Marybelle Killings, MD;  Location: [facility];  Service: Orthopedics;  Laterality: Right;   Social History   Occupational History  . Not on file  Tobacco Use  . Smoking status: Former Research scientist (life sciences)  . Smokeless tobacco: Never Used  Substance and Sexual Activity  . Alcohol use: No  .  Drug use: No  . Sexual activity: Yes

## 2018-11-03 ENCOUNTER — Other Ambulatory Visit: Payer: Self-pay

## 2018-11-03 DIAGNOSIS — I10 Essential (primary) hypertension: Secondary | ICD-10-CM

## 2018-11-03 MED ORDER — AMLODIPINE BESYLATE 10 MG PO TABS
10.0000 mg | ORAL_TABLET | Freq: Every day | ORAL | 1 refills | Status: DC
Start: 2018-11-03 — End: 2019-03-30

## 2018-11-03 MED FILL — AMLODIPINE BESYLATE 10 MG T: 10 | 30 days supply | Qty: 30 | Fill #0

## 2018-11-12 ENCOUNTER — Telehealth: Payer: Self-pay | Admitting: *Deleted

## 2018-11-12 NOTE — Telephone Encounter (Signed)
Patient no show PV today. Called patient, no answer, left message for him to call us back before 5 pm today to reschedule the PV or the colonoscopy will be cancelled.

## 2018-11-15 ENCOUNTER — Ambulatory Visit (AMBULATORY_SURGERY_CENTER): Payer: Self-pay | Admitting: *Deleted

## 2018-11-15 ENCOUNTER — Encounter: Payer: Self-pay | Admitting: Gastroenterology

## 2018-11-15 VITALS — Ht 70.0 in | Wt 270.0 lb

## 2018-11-15 DIAGNOSIS — Z86010 Personal history of colonic polyps: Secondary | ICD-10-CM

## 2018-11-15 DIAGNOSIS — Z800 Family history of malignant neoplasm of digestive organs: Secondary | ICD-10-CM

## 2018-11-15 MED ORDER — PEG 3350-KCL-NA BICARB-NACL 420 G PO SOLR: 4000.0000 mL | Freq: Once | ORAL | 0 refills | Status: AC

## 2018-11-15 NOTE — Progress Notes (Signed)
Patient denies any allergies to eggs or soy. Patient denies any problems with anesthesia/sedation. Patient denies any oxygen use at home. Patient denies taking any diet/weight loss medications or blood thinners. EMMI education offered, pt declined.  

## 2018-11-17 ENCOUNTER — Ambulatory Visit (INDEPENDENT_AMBULATORY_CARE_PROVIDER_SITE_OTHER): Payer: 59 | Admitting: Orthopaedic Surgery

## 2018-11-19 ENCOUNTER — Ambulatory Visit (AMBULATORY_SURGERY_CENTER): Payer: 59 | Admitting: Gastroenterology

## 2018-11-19 ENCOUNTER — Encounter: Payer: Self-pay | Admitting: Gastroenterology

## 2018-11-19 VITALS — BP 136/88 | HR 84 | Temp 98.4°F | Resp 17 | Ht 70.0 in | Wt 240.0 lb

## 2018-11-19 DIAGNOSIS — D120 Benign neoplasm of cecum: Secondary | ICD-10-CM | POA: Diagnosis not present

## 2018-11-19 DIAGNOSIS — D124 Benign neoplasm of descending colon: Secondary | ICD-10-CM | POA: Diagnosis not present

## 2018-11-19 DIAGNOSIS — Z86010 Personal history of colonic polyps: Secondary | ICD-10-CM

## 2018-11-19 DIAGNOSIS — D122 Benign neoplasm of ascending colon: Secondary | ICD-10-CM | POA: Diagnosis not present

## 2018-11-19 DIAGNOSIS — D125 Benign neoplasm of sigmoid colon: Secondary | ICD-10-CM

## 2018-11-19 DIAGNOSIS — Z800 Family history of malignant neoplasm of digestive organs: Secondary | ICD-10-CM

## 2018-11-19 NOTE — Op Note (Signed)
[facility] Patient Name: [name] Procedure Date: 11/19/2018 1:27 PM MRN: [id number] Endoscopist: Milus Banister , MD Age: 52 Referring MD:  Date of Birth: March 26, 1966 Gender: Male Account #: [id number] Procedure:                Colonoscopy Indications:              High risk colon cancer surveillance: Personal                            history of colonic polyps Medicines:                Monitored Anesthesia Care Procedure:                Pre-Anesthesia Assessment:                           - Prior to the procedure, a History and Physical                            was performed, and patient medications and                            allergies were reviewed. The patient's tolerance of                            previous anesthesia was also reviewed. The risks                            and benefits of the procedure and the sedation                            options and risks were discussed with the patient.                            All questions were answered, and informed consent                            was obtained. Prior Anticoagulants: The patient has                            taken no previous anticoagulant or antiplatelet                            agents. ASA Grade Assessment: II - A patient with                            mild systemic disease. After reviewing the risks                            and benefits, the patient was deemed in                            satisfactory condition to undergo the procedure.  After obtaining informed consent, the colonoscope                            was passed under direct vision. Throughout the                            procedure, the patient's blood pressure, pulse, and                            oxygen saturations were monitored continuously. The                            Colonoscope was introduced through the anus and                            advanced to the the cecum, identified by                             appendiceal orifice and ileocecal valve. The                            colonoscopy was performed without difficulty. The                            patient tolerated the procedure well. The quality                            of the bowel preparation was good. The ileocecal                            valve, appendiceal orifice, and rectum were                            photographed. Scope In: 1:32:45 PM Scope Out: 1:50:30 PM Scope Withdrawal Time: 0 hours 15 minutes 44 seconds  Total Procedure Duration: 0 hours 17 minutes 45 seconds  Findings:                 Three sessile polyps were found in the sigmoid                            colon and ascending colon. The polyps were 3 to 5                            mm in size. These polyps were removed with a cold                            snare. Resection and retrieval were complete.                           A 11 mm polyp was found in the descending colon.                            The polyp was sessile. The  polyp was removed with a                            cold snare. Resection and retrieval were complete.                            There was immediate, brisk bleeding that did not                            stop with saline flushes, 2-3 minutes observation,                            tampanode and so I placed two endoclips at the site                            with immediate cessation of bleeding.                           The exam was otherwise without abnormality on                            direct and retroflexion views. Complications:            No immediate complications. Estimated blood loss:                            None. Estimated Blood Loss:     Estimated blood loss: none. Impression:               - Three 3 to 5 mm polyps in the sigmoid colon and                            in the ascending colon, removed with a cold snare.                            Resected and retrieved.                            - One 11 mm polyp in the descending colon, removed                            with a cold snare. Resected and retrieved.                            Immediate post polypectomy bleeding was treated                            with endoclip placement.                           - The examination was otherwise normal on direct                            and retroflexion views. Recommendation:           -  Patient has a contact number available for                            emergencies. The signs and symptoms of potential                            delayed complications were discussed with the                            patient. Return to normal activities tomorrow.                            Written discharge instructions were provided to the                            patient.                           - Resume previous diet.                           - Continue present medications.                           You will receive a letter within 2-3 weeks with the                            pathology results and my final recommendations.                           If the polyp(s) is proven to be 'pre-cancerous' on                            pathology, you will need repeat colonoscopy in 3-5                            years. If the polyp(s) is NOT 'precancerous' on                            pathology then you should repeat colon cancer                            screening in 10 years with colonoscopy without need                            for colon cancer screening by any method prior to                            then (including stool testing). Milus Banister, MD 11/19/2018 1:54:38 PM This report has been signed electronically.

## 2018-11-19 NOTE — Progress Notes (Signed)
Report to PACU, RN, vss, BBS= Clear.  

## 2018-11-19 NOTE — Progress Notes (Signed)
Called to room to assist during endoscopic procedure.  Patient ID and intended procedure confirmed with present staff. Received instructions for my participation in the procedure from the performing physician.  

## 2018-11-19 NOTE — Patient Instructions (Signed)
YOU HAD AN ENDOSCOPIC PROCEDURE TODAY AT [facility]:   Refer to the procedure report that was given to you for any specific questions about what was found during the examination.  If the procedure report does not answer your questions, please call your gastroenterologist to clarify.  If you requested that your care partner not be given the details of your procedure findings, then the procedure report has been included in a sealed envelope for you to review at your convenience later.  YOU SHOULD EXPECT: Some feelings of bloating in the abdomen. Passage of more gas than usual.  Walking can help get rid of the air that was put into your GI tract during the procedure and reduce the bloating. If you had a lower endoscopy (such as a colonoscopy or flexible sigmoidoscopy) you may notice spotting of blood in your stool or on the toilet paper. If you underwent a bowel prep for your procedure, you may not have a normal bowel movement for a few days.  Please Note:  You might notice some irritation and congestion in your nose or some drainage.  This is from the oxygen used during your procedure.  There is no need for concern and it should clear up in a day or so.  SYMPTOMS TO REPORT IMMEDIATELY:   Following lower endoscopy (colonoscopy or flexible sigmoidoscopy):  Excessive amounts of blood in the stool  Significant tenderness or worsening of abdominal pains  Swelling of the abdomen that is new, acute  Fever of 100F or higher  For urgent or emergent issues, a gastroenterologist can be reached at any hour by calling [phone].   DIET:  We do recommend a small meal at first, but then you may proceed to your regular diet.  Drink plenty of fluids but you should avoid alcoholic beverages for 24 hours.  ACTIVITY:  You should plan to take it easy for the rest of today and you should NOT DRIVE or use heavy machinery until tomorrow (because of the sedation medicines used during the test).     FOLLOW UP: Our staff will call the number listed on your records the next business day following your procedure to check on you and address any questions or concerns that you may have regarding the information given to you following your procedure. If we do not reach you, we will leave a message.  However, if you are feeling well and you are not experiencing any problems, there is no need to return our call.  We will assume that you have returned to your regular daily activities without incident.  If any biopsies were taken you will be contacted by phone or by letter within the next 1-3 weeks.  Please call us at [phone] if you have not heard about the biopsies in 3 weeks.   Await for biopsy results Polyps (handout given) Quickclip Pro (card given)  SIGNATURES/CONFIDENTIALITY: You and/or your care partner have signed paperwork which will be entered into your electronic medical record.  These signatures attest to the fact that that the information above on your After Visit Summary has been reviewed and is understood.  Full responsibility of the confidentiality of this discharge information lies with you and/or your care-partner.

## 2018-11-22 ENCOUNTER — Telehealth: Payer: Self-pay | Admitting: *Deleted

## 2018-11-22 NOTE — Telephone Encounter (Signed)
  Follow up Call-  Call back number 11/19/2018  Post procedure Call Back phone  # [phone]  Permission to leave phone message Yes  Some recent data might be hidden     Patient questions:  Do you have a fever, pain , or abdominal swelling? No. Pain Score  0 *  Have you tolerated food without any problems? Yes.    Have you been able to return to your normal activities? Yes.    Do you have any questions about your discharge instructions: Diet   No. Medications  No. Follow up visit  No.  Do you have questions or concerns about your Care? Yes.    Actions: * If pain score is 4 or above: No action needed, pain <4.

## 2018-11-29 ENCOUNTER — Encounter: Payer: Self-pay | Admitting: Gastroenterology

## 2018-12-06 MED FILL — AMLODIPINE BESYLATE 10 MG T: 10 | 30 days supply | Qty: 30 | Fill #1

## 2019-01-05 MED FILL — AMLODIPINE BESYLATE 10 MG T: 10 | 30 days supply | Qty: 30 | Fill #2

## 2019-02-16 MED FILL — AMLODIPINE BESYLATE 10 MG T: 10 | 30 days supply | Qty: 30 | Fill #3

## 2019-03-30 ENCOUNTER — Telehealth: Payer: Self-pay

## 2019-03-30 DIAGNOSIS — I10 Essential (primary) hypertension: Secondary | ICD-10-CM

## 2019-03-30 MED ORDER — AMLODIPINE BESYLATE 10 MG PO TABS: 10.0000 mg | ORAL_TABLET | Freq: Every day | ORAL | 1 refills | Status: DC

## 2019-03-30 NOTE — Telephone Encounter (Signed)
Medication sent to pharmacy. Tried to contact patient and let him know and one number is not working and the other number does not belong to him.

## 2019-04-08 ENCOUNTER — Ambulatory Visit: Payer: 59 | Admitting: Family Medicine

## 2019-04-15 ENCOUNTER — Ambulatory Visit: Payer: 59 | Admitting: Family Medicine

## 2019-05-03 MED FILL — AMLODIPINE BESYLATE 10 MG T: 10 | 30 days supply | Qty: 30 | Fill #4

## 2019-05-16 ENCOUNTER — Encounter: Payer: Self-pay | Admitting: Family Medicine

## 2019-05-16 ENCOUNTER — Other Ambulatory Visit: Payer: Self-pay

## 2019-05-16 ENCOUNTER — Ambulatory Visit (INDEPENDENT_AMBULATORY_CARE_PROVIDER_SITE_OTHER): Payer: Self-pay | Admitting: Family Medicine

## 2019-05-16 VITALS — BP 154/82 | HR 84 | Temp 98.5°F | Resp 16 | Ht 66.0 in | Wt 276.0 lb

## 2019-05-16 DIAGNOSIS — I10 Essential (primary) hypertension: Secondary | ICD-10-CM

## 2019-05-16 DIAGNOSIS — R7303 Prediabetes: Secondary | ICD-10-CM

## 2019-05-16 LAB — POCT URINALYSIS DIPSTICK
Bilirubin, UA: NEGATIVE
Blood, UA: NEGATIVE
Glucose, UA: NEGATIVE
Ketones, UA: NEGATIVE
Leukocytes, UA: NEGATIVE
Nitrite, UA: NEGATIVE
Protein, UA: NEGATIVE
Spec Grav, UA: 1.02 (ref 1.010–1.025)
Urobilinogen, UA: 0.2 E.U./dL
pH, UA: 6 (ref 5.0–8.0)

## 2019-05-16 LAB — POCT GLYCOSYLATED HEMOGLOBIN (HGB A1C): Hemoglobin A1C: 5.9 % — AB (ref 4.0–5.6)

## 2019-05-16 MED ORDER — LOSARTAN POTASSIUM-HCTZ 50-12.5 MG PO TABS: 1.0000 | ORAL_TABLET | Freq: Every day | ORAL | 3 refills | Status: DC

## 2019-05-16 NOTE — Progress Notes (Signed)
[name]   Progress Note: General Provider: Lanae Boast, FNP  SUBJECTIVE:   [name] is a 53 y.o. male who  has a past medical history of Arthritis of knee, right, GERD (gastroesophageal reflux disease), and Hypertension.. Patient presents today for Hypertension and Follow-up (pre-diabetes)  Patient states that he has been without medications for about 1 month until a few days ago due to mail being behind. He states that he has been on amlodipine for a while and is concerned that it is no longer working. He states that he intermittently checks his BP at home and it is in the 150s/80s. He states that sometimes he has headaches in the afternoon and contributes this to increased BP. He is not currently following a carb modified or low sodium diet. He also denies regular exercise. He has gained weight in the past 6 months due to being quarantined.    Review of Systems  Constitutional: Negative.   HENT: Negative.   Eyes: Negative.   Respiratory: Negative.   Cardiovascular: Negative.   Gastrointestinal: Negative.   Genitourinary: Negative.   Musculoskeletal: Negative.   Skin: Negative.   Neurological: Negative.   Psychiatric/Behavioral: Negative.      OBJECTIVE: BP (!) 154/82 (BP Location: Left Arm, Patient Position: Sitting, Cuff Size: Large)   Pulse 84   Temp 98.5 F (36.9 C) (Oral)   Resp 16   Ht 5\' 6"  (1.676 m)   Wt 276 lb (125.2 kg)   SpO2 98%   BMI 44.55 kg/m   Wt Readings from Last 3 Encounters:  05/16/19 276 lb (125.2 kg)  11/19/18 240 lb (108.9 kg)  11/15/18 270 lb (122.5 kg)     Physical Exam Vitals signs and nursing note reviewed.  Constitutional:      General: He is not in acute distress.    Appearance: Normal appearance.  HENT:     Head: Normocephalic and atraumatic.  Eyes:     Extraocular Movements: Extraocular movements intact.     Conjunctiva/sclera: Conjunctivae normal.     Pupils: Pupils are  equal, round, and reactive to light.  Cardiovascular:     Rate and Rhythm: Normal rate and regular rhythm.     Heart sounds: No murmur.  Pulmonary:     Effort: Pulmonary effort is normal.     Breath sounds: Normal breath sounds.  Musculoskeletal: Normal range of motion.  Skin:    General: Skin is warm and dry.  Neurological:     Mental Status: He is alert and oriented to person, place, and time.  Psychiatric:        Mood and Affect: Mood normal.        Behavior: Behavior normal.        Thought Content: Thought content normal.        Judgment: Judgment normal.     ASSESSMENT/PLAN:   1. Essential hypertension D/C amlodipine. Start Hyzaar. RTC for blood work and BP check.  - Urinalysis Dipstick - losartan-hydrochlorothiazide (HYZAAR) 50-12.5 MG tablet; Take 1 tablet by mouth daily.  Dispense: 90 tablet; Refill: 3 - Comprehensive metabolic panel; Future - Lipid Panel With LDL/HDL Ratio; Future  2. Prediabetes The patient is asked to make an attempt to improve diet and exercise patterns to aid in medical management of this problem.  - Urinalysis Dipstick - HgB A1c   Return in about 3 months (around 08/16/2019) for BP check and Labs in 2 weeks.  Marland Kitchen  The patient was given clear instructions to go to ER or return to medical center if symptoms do not improve, worsen or new problems develop. The patient verbalized understanding and agreed with plan of care.   [name], FNP-BC [facility] [address] [place], [place] [address] [phone]

## 2019-05-16 NOTE — Patient Instructions (Signed)
Lipid Profile Test  Why am I having this test?  The lipid profile test can be used to help evaluate your risk for developing heart disease. The test is also used to monitor your levels during treatment for high cholesterol to see if you are reaching your goals.  What is being tested?  A lipid profile measures the following:   Total cholesterol. Cholesterol is a waxy, fat-like substance in your blood. If your total cholesterol level is high, this can increase your risk for heart disease.   High-density lipoprotein (HDL). This is known as the good cholesterol. Having high levels of HDL decreases your risk for heart disease. Your HDL level may be low if you smoke or do not get enough exercise.   Low-density lipoprotein (LDL). This is known as the bad cholesterol. This type causes plaque to build up in your arteries. Having a low level of LDL is best. Having high levels of LDL increases your risk for heart disease.   Cholesterol to HDL ratio. This is calculated by dividing your total cholesterol by your HDL cholesterol. The ratio is used by health care providers to determine your risk for heart disease. A low ratio is best.   Triglycerides. These are fats that your body can store or burn for energy. Low levels are best. Having high levels of triglycerides increases your risk for heart disease.  What kind of sample is taken?    A blood sample is required for this test. It is usually collected by inserting a needle into a blood vessel.  How do I prepare for this test?  Do not drink alcohol starting at least 24 hours before your test.  Follow any instructions from your health care provider about dietary restrictions before your test.  Do not eat or drink anything other than water after midnight on the night before the test, or as told by your health care provider.  Tell a health care provider about:   All medicines you are taking, including vitamins, herbs, eye drops, creams, and over-the-counter medicines.   Any  medical conditions you have.   Whether you are pregnant or may be pregnant.  How are the results reported?  Your test results will be reported as values that indicate your cholesterol and triglyceride levels. Your health care provider will compare your results to normal ranges that were established after testing a large group of people (reference ranges). Reference ranges may vary among labs and hospitals. For this test, common reference ranges are:  Total cholesterol   Adult or elderly: less than 200 mg/dL.   Child: 120-200 mg/dL.   Infant: 70-175 mg/dL.   Newborn: 53-135 mg/dL.  HDL   Male: greater than 45 mg/dL.   Male: greater than 55 mg/dL.  HDL reference values based on your risk for heart disease:   Low risk for heart disease:  ? Male: 60 mg/dL.  ? Male: 70 mg/dL.   Moderate risk for heart disease:  ? Male: 45 mg/dL.  ? Male: 55 mg/dL.   High risk for heart disease:  ? Male: 25 mg/dL.  ? Male: 35 mg/dL.  LDL   Adults: Your health care provider will determine a target level for LDL based on your risk for heart disease.  ? If you are at low risk, your LDL should be 130 mg/dL or less.  ? If you are at moderate risk, your LDL should be 100 mg/dL or less.  ? If you are at high risk, your LDL should   Male: 4.4.  Risk that is two times average (moderate risk): ? Male: 10.0. ? Male: 7.0.  Risk that is three times average (high risk): ? Male: 24.0. ? Male: 11.0. Triglycerides  Adult or elderly: ? Male: 40-160 mg/dL. ? Male: 35-135 mg/dL.  Children 63-4 years old: ? Male: 40-163 mg/dL. ? Male: 40-128 mg/dL.  Children 60-71 years old: ? Male: 36-138 mg/dL. ? Male: 41-138 mg/dL.  Children 5-83 years old: ? Male: 31-108 mg/dL. ?  Male: 35-114 mg/dL.  Children 53-78 years old: ? Male: 30-86 mg/dL. ? Male: 32-99 mg/dL. Triglycerides should be less than 400 mg/dL even when you are not fasting. What do the results mean? Results that are within the reference ranges are considered normal. Total cholesterol, LDL, and triglyceride levels that are higher than the reference ranges can mean that you have an increased risk for heart disease. An HDL level that is lower than the reference range can also indicate an increased risk. Talk with your health care provider about what your results mean. Questions to ask your health care provider Ask your health care provider, or the department that is doing the test:  When will my results be ready?  How will I get my results?  What are my treatment options?  What other tests do I need?  What are my next steps? Summary  The lipid profile test can be used to help predict the likelihood that you will develop heart disease. It can also help monitor your cholesterol levels during treatment.  A lipid profile measures your total cholesterol, high-density lipoprotein (HDL), low-density lipoprotein (LDL), cholesterol to HDL ratio, and triglycerides.  Total cholesterol, LDL, and triglyceride levels that are higher than the reference ranges can indicate an increased risk for heart disease.  An HDL level that is lower than the reference range can indicate an increased risk for heart disease.  Talk with your health care provider about what your results mean. This information is not intended to replace advice given to you by your health care provider. Make sure you discuss any questions you have with your health care provider. Document Released: 01/08/2005 Document Revised: 09/21/2017 Document Reviewed: 09/21/2017 Elsevier Interactive Patient Education  2019 [place]. Hydrochlorothiazide, HCTZ; Losartan tablets What is this medicine? LOSARTAN; HYDROCHLOROTHIAZIDE (loe SAR tan; hye  droe klor oh THYE a zide) is a combination of a drug that relaxes blood vessels and a diuretic. It is used to treat high blood pressure. This medicine may also reduce the risk of stroke in certain patients. This medicine may be used for other purposes; ask your health care provider or pharmacist if you have questions. COMMON BRAND NAME(S): Hyzaar What should I tell my health care provider before I take this medicine? They need to know if you have any of these conditions: -decreased urine -kidney disease -liver disease -if you are on a special diet, like a low-salt diet -immune system problems, like lupus -an unusual or allergic reaction to losartan, hydrochlorothiazide, sulfa drugs, other medicines, foods, dyes, or preservatives -pregnant or trying to get pregnant -breast-feeding How should I use this medicine? Take this medicine by mouth with a glass of water. Follow the directions on the prescription label. You can take it with or without food. If it upsets your stomach, take it with food. Take your medicine at regular intervals. Do not take it more often than directed. Do not stop taking except on your doctor's advice. Talk to your pediatrician regarding the use of this medicine in children.  Special care may be needed. Overdosage: If you think you have taken too much of this medicine contact a poison control center or emergency room at once. NOTE: This medicine is only for you. Do not share this medicine with others. What if I miss a dose? If you miss a dose, take it as soon as you can. If it is almost time for your next dose, take only that dose. Do not take double or extra doses. What may interact with this medicine? -barbiturates, like phenobarbital -blood pressure medicines -celecoxib -cimetidine -corticosteroids -diabetic medicines -diuretics, especially triamterene, spironolactone or amiloride -fluconazole -lithium -NSAIDs, medicines for pain and inflammation, like ibuprofen or  naproxen -potassium salts or potassium supplements -prescription pain medicines -rifampin -skeletal muscle relaxants like tubocurarine -some cholesterol-lowering medicines like cholestyramine or colestipol This list may not describe all possible interactions. Give your health care provider a list of all the medicines, herbs, non-prescription drugs, or dietary supplements you use. Also tell them if you smoke, drink alcohol, or use illegal drugs. Some items may interact with your medicine. What should I watch for while using this medicine? Check your blood pressure regularly while you are taking this medicine. Ask your doctor or health care professional what your blood pressure should be and when you should contact him or her. When you check your blood pressure, write down the measurements to show your doctor or health care professional. If you are taking this medicine for a long time, you must visit your health care professional for regular checks on your progress. Make sure you schedule appointments on a regular basis. You must not get dehydrated. Ask your doctor or health care professional how much fluid you need to drink a day. Check with him or her if you get an attack of severe diarrhea, nausea and vomiting, or if you sweat a lot. The loss of too much body fluid can make it dangerous for you to take this medicine. Women should inform their doctor if they wish to become pregnant or think they might be pregnant. There is a potential for serious side effects to an unborn child, particularly in the second or third trimester. Talk to your health care professional or pharmacist for more information. You may get drowsy or dizzy. Do not drive, use machinery, or do anything that needs mental alertness until you know how this drug affects you. Do not stand or sit up quickly, especially if you are an older patient. This reduces the risk of dizzy or fainting spells. Alcohol can make you more drowsy and dizzy.  Avoid alcoholic drinks. This medicine may affect your blood sugar level. If you have diabetes, check with your doctor or health care professional before changing the dose of your diabetic medicine. Avoid salt substitutes unless you are told otherwise by your doctor or health care professional. Do not treat yourself for coughs, colds, or pain while you are taking this medicine without asking your doctor or health care professional for advice. Some ingredients may increase your blood pressure. What side effects may I notice from receiving this medicine? Side effects that you should report to your doctor or health care professional as soon as possible: -allergic reactions like skin rash, itching or hives, swelling of the face, lips, or tongue -breathing problems -changes in vision -dark urine -eye pain -fast or irregular heart beat, palpitations, or chest pain -feeling faint or lightheaded -muscle cramps -persistent dry cough -redness, blistering, peeling or loosening of the skin, including inside the mouth -stomach pain -trouble  passing urine or change in the amount of urine -unusual bleeding or bruising -worsened gout pain -yellowing of the eyes or skin Side effects that usually do not require medical attention (report to your doctor or health care professional if they continue or are bothersome): -change in sex drive or performance -headache This list may not describe all possible side effects. Call your doctor for medical advice about side effects. You may report side effects to FDA at 1-800-FDA-1088. Where should I keep my medicine? Keep out of the reach of children. Store at room temperature between 15 and 30 degrees C (59 and 86 degrees F). Protect from light. Keep container tightly closed. Throw away any unused medicine after the expiration date. NOTE: This sheet is a summary. It may not cover all possible information. If you have questions about this medicine, talk to your doctor,  pharmacist, or health care provider.  2019 Elsevier/Gold Standard (2010-09-04 13:57:32)

## 2019-05-30 ENCOUNTER — Other Ambulatory Visit: Payer: Self-pay

## 2019-05-30 DIAGNOSIS — I10 Essential (primary) hypertension: Secondary | ICD-10-CM

## 2019-05-31 LAB — COMPREHENSIVE METABOLIC PANEL
ALT: 52 IU/L — ABNORMAL HIGH (ref 0–44)
AST: 29 IU/L (ref 0–40)
Albumin/Globulin Ratio: 1.4 (ref 1.2–2.2)
Albumin: 4.6 g/dL (ref 3.8–4.9)
Alkaline Phosphatase: 93 IU/L (ref 39–117)
BUN/Creatinine Ratio: 12 (ref 9–20)
BUN: 12 mg/dL (ref 6–24)
Bilirubin Total: 0.4 mg/dL (ref 0.0–1.2)
CO2: 24 mmol/L (ref 20–29)
Calcium: 10.2 mg/dL (ref 8.7–10.2)
Chloride: 95 mmol/L — ABNORMAL LOW (ref 96–106)
Creatinine, Ser: 1 mg/dL (ref 0.76–1.27)
GFR calc Af Amer: 99 mL/min/{1.73_m2} (ref >59)
GFR calc non Af Amer: 86 mL/min/{1.73_m2} (ref >59)
Globulin, Total: 3.4 g/dL (ref 1.5–4.5)
Glucose: 106 mg/dL — ABNORMAL HIGH (ref 65–99)
Potassium: 4.9 mmol/L (ref 3.5–5.2)
Sodium: 134 mmol/L (ref 134–144)
Total Protein: 8 g/dL (ref 6.0–8.5)

## 2019-05-31 LAB — LIPID PANEL WITH LDL/HDL RATIO
Cholesterol, Total: 144 mg/dL (ref 100–199)
HDL: 58 mg/dL (ref >39)
LDL Calculated: 65 mg/dL (ref 0–99)
LDl/HDL Ratio: 1.1 ratio (ref 0.0–3.6)
Triglycerides: 107 mg/dL (ref 0–149)
VLDL Cholesterol Cal: 21 mg/dL (ref 5–40)

## 2019-06-02 NOTE — Progress Notes (Signed)
Your labs are stable. Continue with your current medications. Please remember to keep your follow up appointment. If you have problems, questions or concerns, please make an appointment to discuss. Thanks!

## 2019-06-06 MED FILL — AMLODIPINE BESYLATE 10 MG T: 10 | 30 days supply | Qty: 30 | Fill #5

## 2019-07-11 ENCOUNTER — Other Ambulatory Visit: Payer: Self-pay

## 2019-07-11 ENCOUNTER — Other Ambulatory Visit: Payer: Self-pay | Admitting: Family Medicine

## 2019-07-11 DIAGNOSIS — I10 Essential (primary) hypertension: Secondary | ICD-10-CM

## 2019-07-11 MED ORDER — LOSARTAN POTASSIUM-HCTZ 50-12.5 MG PO TABS: 1.0000 | ORAL_TABLET | Freq: Every day | ORAL | 3 refills | Status: DC

## 2019-07-13 ENCOUNTER — Telehealth: Payer: Self-pay | Admitting: Family Medicine

## 2019-07-13 DIAGNOSIS — I10 Essential (primary) hypertension: Secondary | ICD-10-CM

## 2019-07-13 MED ORDER — LOSARTAN POTASSIUM-HCTZ 50-12.5 MG PO TABS: 1.0000 | ORAL_TABLET | Freq: Every day | ORAL | 3 refills | Status: DC

## 2019-07-13 MED FILL — LOSARTAN-HCTZ 50-12.5 MG TA: 50-12.5 | 30 days supply | Qty: 30 | Fill #0

## 2019-07-13 NOTE — Telephone Encounter (Signed)
Refill for bp med sent to pharmacy. Thanks!

## 2019-08-17 ENCOUNTER — Ambulatory Visit: Payer: Self-pay | Admitting: Family Medicine

## 2019-08-19 MED FILL — LOSARTAN-HCTZ 50-12.5 MG TA: 50-12.5 | 30 days supply | Qty: 30 | Fill #1

## 2019-08-22 ENCOUNTER — Ambulatory Visit: Payer: Self-pay | Admitting: Family Medicine

## 2019-08-29 ENCOUNTER — Encounter: Payer: Self-pay | Admitting: Family Medicine

## 2019-08-29 ENCOUNTER — Other Ambulatory Visit: Payer: Self-pay

## 2019-08-29 ENCOUNTER — Ambulatory Visit (INDEPENDENT_AMBULATORY_CARE_PROVIDER_SITE_OTHER): Payer: Self-pay | Admitting: Family Medicine

## 2019-08-29 VITALS — BP 133/82 | HR 80 | Temp 98.3°F | Resp 16 | Ht 66.0 in | Wt 277.0 lb

## 2019-08-29 DIAGNOSIS — Z23 Encounter for immunization: Secondary | ICD-10-CM

## 2019-08-29 DIAGNOSIS — N529 Male erectile dysfunction, unspecified: Secondary | ICD-10-CM

## 2019-08-29 DIAGNOSIS — I10 Essential (primary) hypertension: Secondary | ICD-10-CM

## 2019-08-29 LAB — POCT URINALYSIS DIPSTICK
Bilirubin, UA: NEGATIVE
Blood, UA: NEGATIVE
Glucose, UA: NEGATIVE
Leukocytes, UA: NEGATIVE
Nitrite, UA: NEGATIVE
Protein, UA: NEGATIVE
Spec Grav, UA: 1.02 (ref 1.010–1.025)
Urobilinogen, UA: 1 E.U./dL
pH, UA: 7.5 (ref 5.0–8.0)

## 2019-08-29 MED ORDER — SILDENAFIL CITRATE 50 MG PO TABS: 50.0000 mg | ORAL_TABLET | Freq: Every day | ORAL | 0 refills | Status: DC | PRN

## 2019-08-29 MED FILL — SILDENAFIL CITRATE 50 MG TA: 50 | 30 days supply | Qty: 10 | Fill #0

## 2019-08-29 NOTE — Patient Instructions (Signed)
Hypertension, Adult Hypertension is another name for high blood pressure. High blood pressure forces your heart to work harder to pump blood. This can cause problems over time. There are two numbers in a blood pressure reading. There is a top number (systolic) over a bottom number (diastolic). It is best to have a blood pressure that is below 120/80. Healthy choices can help lower your blood pressure, or you may need medicine to help lower it. What are the causes? The cause of this condition is not known. Some conditions may be related to high blood pressure. What increases the risk?  Smoking.  Having type 2 diabetes mellitus, high cholesterol, or both.  Not getting enough exercise or physical activity.  Being overweight.  Having too much fat, sugar, calories, or salt (sodium) in your diet.  Drinking too much alcohol.  Having long-term (chronic) kidney disease.  Having a family history of high blood pressure.  Age. Risk increases with age.  Race. You may be at higher risk if you are African American.  Gender. Men are at higher risk than women before age 45. After age 65, women are at higher risk than men.  Having obstructive sleep apnea.  Stress. What are the signs or symptoms?  High blood pressure may not cause symptoms. Very high blood pressure (hypertensive crisis) may cause: ? Headache. ? Feelings of worry or nervousness (anxiety). ? Shortness of breath. ? Nosebleed. ? A feeling of being sick to your stomach (nausea). ? Throwing up (vomiting). ? Changes in how you see. ? Very bad chest pain. ? Seizures. How is this treated?  This condition is treated by making healthy lifestyle changes, such as: ? Eating healthy foods. ? Exercising more. ? Drinking less alcohol.  Your health care provider may prescribe medicine if lifestyle changes are not enough to get your blood pressure under control, and if: ? Your top number is above 130. ? Your bottom number is above 80.   Your personal target blood pressure may vary. Follow these instructions at home: Eating and drinking   If told, follow the DASH eating plan. To follow this plan: ? Fill one half of your plate at each meal with fruits and vegetables. ? Fill one fourth of your plate at each meal with whole grains. Whole grains include whole-wheat pasta, brown rice, and whole-grain bread. ? Eat or drink low-fat dairy products, such as skim milk or low-fat yogurt. ? Fill one fourth of your plate at each meal with low-fat (lean) proteins. Low-fat proteins include fish, chicken without skin, eggs, beans, and tofu. ? Avoid fatty meat, cured and processed meat, or chicken with skin. ? Avoid pre-made or processed food.  Eat less than 1,500 mg of salt each day.  Do not drink alcohol if: ? Your doctor tells you not to drink. ? You are pregnant, may be pregnant, or are planning to become pregnant.  If you drink alcohol: ? Limit how much you use to:  0-1 drink a day for women.  0-2 drinks a day for men. ? Be aware of how much alcohol is in your drink. In the [place] one drink equals one 12 oz bottle of beer (355 mL), one 5 oz glass of wine (148 mL), or one 1 oz glass of hard liquor (44 mL). Lifestyle   Work with your doctor to stay at a healthy weight or to lose weight. Ask your doctor what the best weight is for you.  Get at least 30 minutes of exercise most   days of the week. This may include walking, swimming, or biking.  Get at least 30 minutes of exercise that strengthens your muscles (resistance exercise) at least 3 days a week. This may include lifting weights or doing Pilates.  Do not use any products that contain nicotine or tobacco, such as cigarettes, e-cigarettes, and chewing tobacco. If you need help quitting, ask your doctor.  Check your blood pressure at home as told by your doctor.  Keep all follow-up visits as told by your doctor. This is important. Medicines  Take over-the-counter and  prescription medicines only as told by your doctor. Follow directions carefully.  Do not skip doses of blood pressure medicine. The medicine does not work as well if you skip doses. Skipping doses also puts you at risk for problems.  Ask your doctor about side effects or reactions to medicines that you should watch for. Contact a doctor if you:  Think you are having a reaction to the medicine you are taking.  Have headaches that keep coming back (recurring).  Feel dizzy.  Have swelling in your ankles.  Have trouble with your vision. Get help right away if you:  Get a very bad headache.  Start to feel mixed up (confused).  Feel weak or numb.  Feel faint.  Have very bad pain in your: ? Chest. ? Belly (abdomen).  Throw up more than once.  Have trouble breathing. Summary  Hypertension is another name for high blood pressure.  High blood pressure forces your heart to work harder to pump blood.  For most people, a normal blood pressure is less than 120/80.  Making healthy choices can help lower blood pressure. If your blood pressure does not get lower with healthy choices, you may need to take medicine. This information is not intended to replace advice given to you by your health care provider. Make sure you discuss any questions you have with your health care provider. Document Released: 06/02/2008 Document Revised: 08/25/2018 Document Reviewed: 08/25/2018 Elsevier Patient Education  [place]. Erectile Dysfunction Erectile dysfunction (ED) is the inability to get or keep an erection in order to have sexual intercourse. Erectile dysfunction may include:  Inability to get an erection.  Lack of enough hardness of the erection to allow penetration.  Loss of the erection before sex is finished. What are the causes? This condition may be caused by:  Certain medicines, such as: ? Pain relievers. ? Antihistamines. ? Antidepressants. ? Blood pressure  medicines. ? Water pills (diuretics). ? Ulcer medicines. ? Muscle relaxants. ? Drugs.  Excessive drinking.  Psychological causes, such as: ? Anxiety. ? Depression. ? Sadness. ? Exhaustion. ? Performance fear. ? Stress.  Physical causes, such as: ? Artery problems. This may include diabetes, smoking, liver disease, or atherosclerosis. ? High blood pressure. ? Hormonal problems, such as low testosterone. ? Obesity. ? Nerve problems. This may include back or pelvic injuries, diabetes mellitus, multiple sclerosis, or Parkinson disease. What are the signs or symptoms? Symptoms of this condition include:  Inability to get an erection.  Lack of enough hardness of the erection to allow penetration.  Loss of the erection before sex is finished.  Normal erections at some times, but with frequent unsatisfactory episodes.  Low sexual satisfaction in either partner due to erection problems.  A curved penis occurring with erection. The curve may cause pain or the penis may be too curved to allow for intercourse.  Never having nighttime erections. How is this diagnosed? This condition is often  diagnosed by:  Performing a physical exam to find other diseases or specific problems with the penis.  Asking you detailed questions about the problem.  Performing blood tests to check for diabetes mellitus or to measure hormone levels.  Performing other tests to check for underlying health conditions.  Performing an ultrasound exam to check for scarring.  Performing a test to check blood flow to the penis.  Doing a sleep study at home to measure nighttime erections. How is this treated? This condition may be treated by:  Medicine taken by mouth to help you achieve an erection (oral medicine).  Hormone replacement therapy to replace low testosterone levels.  Medicine that is injected into the penis. Your health care provider may instruct you how to give yourself these injections at  home.  Vacuum pump. This is a pump with a ring on it. The pump and ring are placed on the penis and used to create pressure that helps the penis become erect.  Penile implant surgery. In this procedure, you may receive: ? An inflatable implant. This consists of cylinders, a pump, and a reservoir. The cylinders can be inflated with a fluid that helps to create an erection, and they can be deflated after intercourse. ? A semi-rigid implant. This consists of two silicone rubber rods. The rods provide some rigidity. They are also flexible, so the penis can both curve downward in its normal position and become straight for sexual intercourse.  Blood vessel surgery, to improve blood flow to the penis. During this procedure, a blood vessel from a different part of the body is placed into the penis to allow blood to flow around (bypass) damaged or blocked blood vessels.  Lifestyle changes, such as exercising more, losing weight, and quitting smoking. Follow these instructions at home: Medicines   Take over-the-counter and prescription medicines only as told by your health care provider. Do not increase the dosage without first discussing it with your health care provider.  If you are using self-injections, perform injections as directed by your health care provider. Make sure to avoid any veins that are on the surface of the penis. After giving an injection, apply pressure to the injection site for 5 minutes. General instructions  Exercise regularly, as directed by your health care provider. Work with your health care provider to lose weight, if needed.  Do not use any products that contain nicotine or tobacco, such as cigarettes and e-cigarettes. If you need help quitting, ask your health care provider.  Before using a vacuum pump, read the instructions that come with the pump and discuss any questions with your health care provider.  Keep all follow-up visits as told by your health care provider.  This is important. Contact a health care provider if:  You feel nauseous.  You vomit. Get help right away if:  You are taking oral or injectable medicines and you have an erection that lasts longer than 4 hours. If your health care provider is unavailable, go to the nearest emergency room for evaluation. An erection that lasts much longer than 4 hours can result in permanent damage to your penis.  You have severe pain in your groin or abdomen.  You develop redness or severe swelling of your penis.  You have redness spreading up into your groin or lower abdomen.  You are unable to urinate.  You experience chest pain or a rapid heart beat (palpitations) after taking oral medicines. Summary  Erectile dysfunction (ED) is the inability to get or  keep an erection during sexual intercourse. This problem can usually be treated successfully.  This condition is diagnosed based on a physical exam, your symptoms, and tests to determine the cause. Treatment varies depending on the cause, and may include medicines, hormone therapy, surgery, or vacuum pump.  You may need follow-up visits to make sure that you are using your medicines or devices correctly.  Get help right away if you are taking or injecting medicines and you have an erection that lasts longer than 4 hours. This information is not intended to replace advice given to you by your health care provider. Make sure you discuss any questions you have with your health care provider. Document Released: 12/12/2000 Document Revised: 11/27/2017 Document Reviewed: 12/31/2016 Elsevier Patient Education  2020 [employer].

## 2019-08-29 NOTE — Progress Notes (Signed)
Patient [facility]   Progress Note: General Provider: Lanae Boast, FNP  SUBJECTIVE:   [name] is a 53 y.o. male who  has a past medical history of Arthritis of knee, right, GERD (gastroesophageal reflux disease), and Hypertension.. Patient presents today for Hypertension, Abdominal Pain (left side abdominal pain that comes and goes ), and Erectile Dysfunction Patient is not following a low sodium, carb modified diet. He has not been doing regular physical activity other than work. Patient reports compliance with all medications.  Patient denies side effects of medications.  He has been having erectile dysfunction for the past year. He denies pain with erections. He is married and reports one male partner   Review of Systems  Constitutional: Negative.   HENT: Negative.   Eyes: Negative.   Respiratory: Negative.   Cardiovascular: Negative.   Gastrointestinal: Negative.   Genitourinary: Negative.   Musculoskeletal: Negative.   Skin: Negative.   Neurological: Negative.   Psychiatric/Behavioral: Negative.      OBJECTIVE: BP 133/82 (BP Location: Right Arm, Patient Position: Sitting, Cuff Size: Large)   Pulse 80   Temp 98.3 F (36.8 C) (Oral)   Resp 16   Ht 5\' 6"  (1.676 m)   Wt 277 lb (125.6 kg)   SpO2 96%   BMI 44.71 kg/m   Wt Readings from Last 3 Encounters:  08/29/19 277 lb (125.6 kg)  05/16/19 276 lb (125.2 kg)  11/19/18 240 lb (108.9 kg)     Physical Exam Vitals signs and nursing note reviewed.  Constitutional:      General: He is not in acute distress.    Appearance: Normal appearance.  HENT:     Head: Normocephalic and atraumatic.  Eyes:     Extraocular Movements: Extraocular movements intact.     Conjunctiva/sclera: Conjunctivae normal.     Pupils: Pupils are equal, round, and reactive to light.  Cardiovascular:     Rate and Rhythm: Normal rate and regular rhythm.     Heart sounds: No murmur.  Pulmonary:      Effort: Pulmonary effort is normal.     Breath sounds: Normal breath sounds.  Musculoskeletal: Normal range of motion.  Skin:    General: Skin is warm and dry.  Neurological:     Mental Status: He is alert and oriented to person, place, and time.  Psychiatric:        Mood and Affect: Mood normal.        Behavior: Behavior normal.        Thought Content: Thought content normal.        Judgment: Judgment normal.     ASSESSMENT/PLAN:  1. Essential hypertension No medication changes warranted at the present time.   - Urinalysis Dipstick  2. Erectile dysfunction, unspecified erectile dysfunction type Discussed possible reasons for ED that include weight, medications and psychological reasons.  - sildenafil (VIAGRA) 50 MG tablet; Take 1 tablet (50 mg total) by mouth daily as needed for erectile dysfunction.  Dispense: 10 tablet; Refill: 0  3. Influenza vaccination administered at current visit Influenza vaccination given in the office visit today. - Flu Vaccine QUAD 6+ mos PF IM (Fluarix Quad PF)   Return in about 6 months (around 02/26/2020) for htn.    The patient was given clear instructions to go to ER or return to medical center if symptoms do not improve, worsen or new problems develop. The patient verbalized understanding and agreed with plan of care.  [name], FNP-BC [facility] [address] [place], [place] [address] [phone]

## 2019-09-07 ENCOUNTER — Encounter (HOSPITAL_COMMUNITY): Payer: Self-pay

## 2019-09-07 ENCOUNTER — Encounter (HOSPITAL_COMMUNITY): Payer: Self-pay | Admitting: *Deleted

## 2019-09-26 MED FILL — LOSARTAN-HCTZ 50-12.5 MG TA: 50-12.5 | 30 days supply | Qty: 30 | Fill #2

## 2019-10-31 MED FILL — LOSARTAN-HCTZ 50-12.5 MG TA: 50-12.5 | 30 days supply | Qty: 30 | Fill #3

## 2019-12-02 MED FILL — LOSARTAN-HCTZ 50-12.5 MG TA: 50-12.5 | 30 days supply | Qty: 30 | Fill #4

## 2020-01-10 MED FILL — LOSARTAN-HCTZ 50-12.5 MG TA: 50-12.5 | 30 days supply | Qty: 30 | Fill #5

## 2020-02-17 MED FILL — LOSARTAN-HCTZ 50-12.5 MG TA: 50-12.5 | 30 days supply | Qty: 30 | Fill #6

## 2020-02-24 ENCOUNTER — Telehealth: Payer: Self-pay | Admitting: Family Medicine

## 2020-02-24 NOTE — Telephone Encounter (Signed)
Pt was called and reminded of appointment.

## 2020-02-27 ENCOUNTER — Other Ambulatory Visit: Payer: Self-pay

## 2020-02-27 ENCOUNTER — Ambulatory Visit: Payer: Self-pay | Admitting: Family Medicine

## 2020-02-27 ENCOUNTER — Ambulatory Visit (INDEPENDENT_AMBULATORY_CARE_PROVIDER_SITE_OTHER): Payer: Self-pay | Admitting: Family Medicine

## 2020-02-27 ENCOUNTER — Encounter: Payer: Self-pay | Admitting: Family Medicine

## 2020-02-27 VITALS — BP 153/86 | HR 74 | Temp 98.6°F | Resp 16 | Ht 66.0 in | Wt 278.0 lb

## 2020-02-27 DIAGNOSIS — Z09 Encounter for follow-up examination after completed treatment for conditions other than malignant neoplasm: Secondary | ICD-10-CM

## 2020-02-27 DIAGNOSIS — N529 Male erectile dysfunction, unspecified: Secondary | ICD-10-CM | POA: Insufficient documentation

## 2020-02-27 DIAGNOSIS — R7303 Prediabetes: Secondary | ICD-10-CM

## 2020-02-27 DIAGNOSIS — Z6841 Body Mass Index (BMI) 40.0 and over, adult: Secondary | ICD-10-CM

## 2020-02-27 DIAGNOSIS — M5489 Other dorsalgia: Secondary | ICD-10-CM

## 2020-02-27 DIAGNOSIS — E66813 Obesity, class 3: Secondary | ICD-10-CM | POA: Insufficient documentation

## 2020-02-27 DIAGNOSIS — I10 Essential (primary) hypertension: Secondary | ICD-10-CM

## 2020-02-27 DIAGNOSIS — G8929 Other chronic pain: Secondary | ICD-10-CM | POA: Insufficient documentation

## 2020-02-27 DIAGNOSIS — M549 Dorsalgia, unspecified: Secondary | ICD-10-CM

## 2020-02-27 LAB — POCT URINALYSIS DIPSTICK
Bilirubin, UA: NEGATIVE
Blood, UA: NEGATIVE
Glucose, UA: NEGATIVE
Leukocytes, UA: NEGATIVE
Nitrite, UA: NEGATIVE
Protein, UA: POSITIVE — AB
Spec Grav, UA: 1.025 (ref 1.010–1.025)
Urobilinogen, UA: 0.2 E.U./dL
pH, UA: 6 (ref 5.0–8.0)

## 2020-02-27 LAB — POCT GLYCOSYLATED HEMOGLOBIN (HGB A1C): Hemoglobin A1C: 5.9 % — AB (ref 4.0–5.6)

## 2020-02-27 MED ORDER — SILDENAFIL CITRATE 100 MG PO TABS: 100.0000 mg | ORAL_TABLET | Freq: Every day | ORAL | 3 refills | Status: DC | PRN

## 2020-02-27 MED FILL — SILDENAFIL CITRATE 100 MG T: 100 | 30 days supply | Qty: 5 | Fill #0

## 2020-02-27 NOTE — Patient Instructions (Signed)
Sildenafil tablets (Erectile Dysfunction) What is this medicine? SILDENAFIL (sil DEN a fil) is used to treat erection problems in men. This medicine may be used for other purposes; ask your health care provider or pharmacist if you have questions. COMMON BRAND NAME(S): Viagra What should I tell my health care provider before I take this medicine? They need to know if you have any of these conditions:  bleeding disorders  eye or vision problems, including a rare inherited eye disease called retinitis pigmentosa  anatomical deformation of the penis, Peyronie's disease, or history of priapism (painful and prolonged erection)  heart disease, angina, a history of heart attack, irregular heart beats, or other heart problems  high or low blood pressure  history of blood diseases, like sickle cell anemia or leukemia  history of stomach bleeding  kidney disease  liver disease  stroke  an unusual or allergic reaction to sildenafil, other medicines, foods, dyes, or preservatives  pregnant or trying to get pregnant  breast-feeding How should I use this medicine? Take this medicine by mouth with a glass of water. Follow the directions on the prescription label. The dose is usually taken 1 hour before sexual activity. You should not take the dose more than once per day. Do not take your medicine more often than directed. Talk to your pediatrician regarding the use of this medicine in children. This medicine is not used in children for this condition. Overdosage: If you think you have taken too much of this medicine contact a poison control center or emergency room at once. NOTE: This medicine is only for you. Do not share this medicine with others. What if I miss a dose? This does not apply. Do not take double or extra doses. What may interact with this medicine? Do not take this medicine with any of the following medications:  cisapride  nitrates like amyl nitrite, isosorbide  dinitrate, isosorbide mononitrate, nitroglycerin  riociguat This medicine may also interact with the following medications:  antiviral medicines for HIV or AIDS  bosentan  certain medicines for benign prostatic hyperplasia (BPH)  certain medicines for blood pressure  certain medicines for fungal infections like ketoconazole and itraconazole  cimetidine  erythromycin  rifampin This list may not describe all possible interactions. Give your health care provider a list of all the medicines, herbs, non-prescription drugs, or dietary supplements you use. Also tell them if you smoke, drink alcohol, or use illegal drugs. Some items may interact with your medicine. What should I watch for while using this medicine? If you notice any changes in your vision while taking this drug, call your doctor or health care professional as soon as possible. Stop using this medicine and call your health care provider right away if you have a loss of sight in one or both eyes. Contact your doctor or health care professional right away if you have an erection that lasts longer than 4 hours or if it becomes painful. This may be a sign of a serious problem and must be treated right away to prevent permanent damage. If you experience symptoms of nausea, dizziness, chest pain or arm pain upon initiation of sexual activity after taking this medicine, you should refrain from further activity and call your doctor or health care professional as soon as possible. Do not drink alcohol to excess (examples, 5 glasses of wine or 5 shots of whiskey) when taking this medicine. When taken in excess, alcohol can increase your chances of getting a headache or getting dizzy, increasing  your heart rate or lowering your blood pressure. Using this medicine does not protect you or your partner against HIV infection (the virus that causes AIDS) or other sexually transmitted diseases. What side effects may I notice from receiving this  medicine? Side effects that you should report to your doctor or health care professional as soon as possible:  allergic reactions like skin rash, itching or hives, swelling of the face, lips, or tongue  breathing problems  changes in hearing  changes in vision  chest pain  fast, irregular heartbeat  prolonged or painful erection  seizures Side effects that usually do not require medical attention (report to your doctor or health care professional if they continue or are bothersome):  back pain  dizziness  flushing  headache  indigestion  muscle aches  nausea  stuffy or runny nose This list may not describe all possible side effects. Call your doctor for medical advice about side effects. You may report side effects to FDA at 1-800-FDA-1088. Where should I keep my medicine? Keep out of reach of children. Store at room temperature between 15 and 30 degrees C (59 and 86 degrees F). Throw away any unused medicine after the expiration date. NOTE: This sheet is a summary. It may not cover all possible information. If you have questions about this medicine, talk to your doctor, pharmacist, or health care provider.  2020 Elsevier/Gold Standard (2015-11-28 12:00:25)  

## 2020-02-27 NOTE — Progress Notes (Signed)
Patient [facility]   Established Patient Office Visit  Subjective:  Patient ID: [name], male    DOB: [birth year]  Age: 54 y.o. MRN: [id number]  CC:  Chief Complaint  Patient presents with  . Hypertension  . Follow-up    pre diabetes   . Medication Refill    viagra     HPI [name] is a 54 year old male who presents for Follow Up today.   Past Medical History:  Diagnosis Date  . Arthritis of knee, right   . GERD (gastroesophageal reflux disease)   . Hypertension    Current Status: This will be [name]'s initial office visit with me. He was previously seeing Lanae Boast, NP for his PCP needs. Since his last office visit, he is doing well with no complaints. He reports occasional headaches X 2 weeks now, which he takes Acetaminophen. He continues to have erectile dysfunction, which current dose of Sildenafil is not effective. He denies visual changes, chest pain, cough, shortness of breath, heart palpitations, and falls. He has occasional headaches and dizziness with position changes. Denies severe headaches, confusion, seizures, double vision, and blurred vision, nausea and vomiting. He denies fevers, chills, fatigue, recent infections, weight loss, and night sweats. No reports of GI problems such as diarrhea, and constipation. He has no reports of blood in stools, dysuria and hematuria. No depression or anxiety reported. He denies suicidal ideations, homicidal ideations, or auditory hallucinations. He denies pain today.   Past Surgical History:  Procedure Laterality Date  . CHOLECYSTECTOMY    . COLONOSCOPY  02/04/2006   Dr.Jacobs  . TOTAL KNEE ARTHROPLASTY Right 07/16/2018  . TOTAL KNEE ARTHROPLASTY Right 07/16/2018   Procedure: RIGHT TOTAL KNEE REPLACEMENT;  Surgeon: Marybelle Killings, MD;  Location: [facility];  Service: Orthopedics;  Laterality: Right;    Family History  Problem Relation Age of Onset  . Cancer Father         prostate   . Colon cancer Father 60  . Cancer Maternal Grandfather        prostate  . Colon cancer Maternal Uncle   . Colon cancer Paternal Grandfather   . Esophageal cancer Neg Hx   . Rectal cancer Neg Hx   . Stomach cancer Neg Hx     Social History   Socioeconomic History  . Marital status: Married    Spouse name: Not on file  . Number of children: Not on file  . Years of education: Not on file  . Highest education level: Not on file  Occupational History  . Not on file  Tobacco Use  . Smoking status: Former Smoker    Quit date: 11/20/2015    Years since quitting: 4.2  . Smokeless tobacco: Never Used  Substance and Sexual Activity  . Alcohol use: No  . Drug use: No  . Sexual activity: Yes  Other Topics Concern  . Not on file  Social History Narrative  . Not on file   Social Determinants of Health   Financial Resource Strain:   . Difficulty of Paying Living Expenses: Not on file  Food Insecurity:   . Worried About Charity fundraiser in the Last Year: Not on file  . Ran Out of Food in the Last Year: Not on file  Transportation Needs:   . Lack of Transportation (Medical): Not on file  . Lack of Transportation (Non-Medical): Not on file  Physical Activity:   .  Days of Exercise per Week: Not on file  . Minutes of Exercise per Session: Not on file  Stress:   . Feeling of Stress : Not on file  Social Connections:   . Frequency of Communication with Friends and Family: Not on file  . Frequency of Social Gatherings with Friends and Family: Not on file  . Attends Religious Services: Not on file  . Active Member of Clubs or Organizations: Not on file  . Attends Archivist Meetings: Not on file  . Marital Status: Not on file  Intimate Partner Violence:   . Fear of Current or Ex-Partner: Not on file  . Emotionally Abused: Not on file  . Physically Abused: Not on file  . Sexually Abused: Not on file    Outpatient Medications Prior to Visit   Medication Sig Dispense Refill  . acetaminophen (TYLENOL) 500 MG tablet Take 500 mg by mouth every 6 (six) hours as needed.    Marland Kitchen aspirin EC 325 MG EC tablet Take 1 tablet (325 mg total) by mouth daily with breakfast. 30 tablet 0  . losartan-hydrochlorothiazide (HYZAAR) 50-12.5 MG tablet Take 1 tablet by mouth daily. 90 tablet 3  . sildenafil (VIAGRA) 50 MG tablet Take 1 tablet (50 mg total) by mouth daily as needed for erectile dysfunction. 10 tablet 0   No facility-administered medications prior to visit.    No Known Allergies  ROS Review of Systems  Constitutional: Negative.   HENT: Negative.   Eyes: Negative.   Respiratory: Negative.   Cardiovascular: Negative.   Gastrointestinal: Negative.   Endocrine: Negative.   Genitourinary: Negative.   Musculoskeletal: Positive for arthralgias (generalized joint pain) and back pain (chronic).  Skin: Negative.   Allergic/Immunologic: Negative.   Neurological: Positive for dizziness (occasional ) and headaches (occasional ).  Hematological: Negative.   Psychiatric/Behavioral: Negative.       Objective:    Physical Exam  Constitutional: He is oriented to person, place, and time. He appears well-developed and well-nourished.  HENT:  Head: Normocephalic and atraumatic.  Eyes: Conjunctivae are normal.  Cardiovascular: Normal rate, regular rhythm, normal heart sounds and intact distal pulses.  Pulmonary/Chest: Effort normal and breath sounds normal.  Abdominal: Soft. Bowel sounds are normal. He exhibits distension (obese).  Musculoskeletal:        General: Normal range of motion.     Cervical back: Normal range of motion and neck supple.  Neurological: He is alert and oriented to person, place, and time. He has normal reflexes.  Skin: Skin is warm.  Psychiatric: He has a normal mood and affect. His behavior is normal. Judgment and thought content normal.  Nursing note and vitals reviewed.   BP (!) 153/86 (BP Location: Right Arm,  Patient Position: Sitting, Cuff Size: Large)   Pulse 74   Temp 98.6 F (37 C) (Oral)   Resp 16   Ht 5\' 6"  (1.676 m)   Wt 278 lb (126.1 kg)   SpO2 97%   BMI 44.87 kg/m  Wt Readings from Last 3 Encounters:  02/27/20 278 lb (126.1 kg)  08/29/19 277 lb (125.6 kg)  05/16/19 276 lb (125.2 kg)     There are no preventive care reminders to display for this patient.  There are no preventive care reminders to display for this patient.   Lab Results  Component Value Date   WBC 13.4 (H) 07/19/2018   HGB 12.0 (L) 07/19/2018   HCT 37.0 (L) 07/19/2018   MCV 89.2 07/19/2018   PLT  272 07/19/2018   Lab Results  Component Value Date   NA 134 05/30/2019   K 4.9 05/30/2019   CO2 24 05/30/2019   GLUCOSE 106 (H) 05/30/2019   BUN 12 05/30/2019   CREATININE 1.00 05/30/2019   BILITOT 0.4 05/30/2019   ALKPHOS 93 05/30/2019   AST 29 05/30/2019   ALT 52 (H) 05/30/2019   PROT 8.0 05/30/2019   ALBUMIN 4.6 05/30/2019   CALCIUM 10.2 05/30/2019   ANIONGAP 7 07/17/2018   Lab Results  Component Value Date   CHOL 144 05/30/2019   Lab Results  Component Value Date   HDL 58 05/30/2019   Lab Results  Component Value Date   LDLCALC 65 05/30/2019   Lab Results  Component Value Date   TRIG 107 05/30/2019   Lab Results  Component Value Date   CHOLHDL 1.9 09/04/2016   Lab Results  Component Value Date   HGBA1C 5.9 (A) 02/27/2020   Assessment & Plan:   1. Essential hypertension Blood pressure is stable. He will continue to take medications as prescribed, to decrease high sodium intake, excessive alcohol intake, increase potassium intake, smoking cessation, and increase physical activity of at least 30 minutes of cardio activity daily. He will continue to follow Heart Healthy or DASH diet. - Urinalysis Dipstick  2. Prediabetes Hgb A1c stable at 5.9 today.  - HgB A1c  3. Erectile dysfunction, unspecified erectile dysfunction type We will increase Sildenafil to 100 mg.  - sildenafil  (VIAGRA) 100 MG tablet; Take 1 tablet (100 mg total) by mouth daily as needed for erectile dysfunction.  Dispense: 10 tablet; Refill: 3  4. Back pain without sciatica  5. Class 3 severe obesity due to excess calories with serious comorbidity and BMI of 40.0-44.9 in adult Body mass index is 44.87 kg/m. Goal BMI  is <30. Encouraged efforts to reduce weight include engaging in physical activity as tolerated with goal of 150 minutes per week. Improve dietary choices and eat a meal regimen consistent with a Mediterranean or DASH diet. Reduce simple carbohydrates. Do not skip meals and eat healthy snacks throughout the day to avoid over-eating at dinner. Set a goal weight loss that is achievable for you.  6. Follow up He will follow up with in 6 months with Dionisio David, NP.  Meds ordered this encounter  Medications  . sildenafil (VIAGRA) 100 MG tablet    Sig: Take 1 tablet (100 mg total) by mouth daily as needed for erectile dysfunction.    Dispense:  10 tablet    Refill:  3    Orders Placed This Encounter  Procedures  . HgB A1c  . Urinalysis Dipstick    Referral Orders  No referral(s) requested today    Kathe Becton,  MSN, FNP-BC [facility] [address] [place], [place] [address] [phone]- fax     Problem List Items Addressed This Visit      Cardiovascular and Mediastinum   Essential hypertension - Primary   Relevant Medications   sildenafil (VIAGRA) 100 MG tablet   Other Relevant Orders   Urinalysis Dipstick (Completed)     Other   Prediabetes   Relevant Orders   HgB A1c (Completed)    Other Visit Diagnoses    Erectile dysfunction, unspecified erectile dysfunction type       Relevant Medications   sildenafil (VIAGRA) 100 MG tablet   Back pain without sciatica       Class  3 severe obesity due to excess calories with serious comorbidity and body mass index (BMI) of 40.0 to  44.9 in adult [facility])       Follow up          Meds ordered this encounter  Medications  . sildenafil (VIAGRA) 100 MG tablet    Sig: Take 1 tablet (100 mg total) by mouth daily as needed for erectile dysfunction.    Dispense:  10 tablet    Refill:  3    Follow-up: Return in about 6 months (around 08/29/2020) for Labs/OV.    Azzie Glatter, FNP

## 2020-03-22 MED FILL — LOSARTAN-HCTZ 50-12.5 MG TA: 50-12.5 | 30 days supply | Qty: 30 | Fill #7

## 2020-03-31 ENCOUNTER — Ambulatory Visit (HOSPITAL_COMMUNITY)
Admission: EM | Admit: 2020-03-31 | Discharge: 2020-03-31 | Disposition: A | Discharge status: Home / Self Care | Payer: Self-pay | Attending: Family Medicine | Admitting: Family Medicine

## 2020-03-31 ENCOUNTER — Other Ambulatory Visit: Payer: Self-pay

## 2020-03-31 ENCOUNTER — Encounter (HOSPITAL_COMMUNITY): Payer: Self-pay

## 2020-03-31 DIAGNOSIS — M5442 Lumbago with sciatica, left side: Secondary | ICD-10-CM

## 2020-03-31 DIAGNOSIS — M5441 Lumbago with sciatica, right side: Secondary | ICD-10-CM

## 2020-03-31 MED ORDER — IBUPROFEN 600 MG PO TABS: 600.0000 mg | ORAL_TABLET | Freq: Four times a day (QID) | ORAL | 0 refills | Status: DC | PRN

## 2020-03-31 MED ORDER — CYCLOBENZAPRINE HCL 10 MG PO TABS: 10.0000 mg | ORAL_TABLET | Freq: Two times a day (BID) | ORAL | 0 refills | Status: DC | PRN

## 2020-03-31 NOTE — Discharge Instructions (Addendum)
Take the prescribed ibuprofen as needed for your pain.  Take the muscle relaxer Flexeril as needed for muscle spasm; do not drive, operate machinery, or drink alcohol with this medication as it may make you drowsy.    Follow up with an orthopedist if your pain is not improving.  Go to the emergency department if you have worsening pain or develop new symptoms such as difficulty with urination, weakness, numbness, loss of control of your bladder or bowels, fever, chills or other concerns.
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
## 2020-04-27 MED FILL — LOSARTAN-HCTZ 50-12.5 MG TA: 50-12.5 | 30 days supply | Qty: 30 | Fill #8

## 2020-04-27 MED FILL — SILDENAFIL CITRATE 100 MG T: 100 | 30 days supply | Qty: 10 | Fill #1

## 2020-05-29 ENCOUNTER — Other Ambulatory Visit: Payer: Self-pay

## 2020-05-31 MED FILL — LOSARTAN-HCTZ 50-12.5 MG TA: 50-12.5 | 30 days supply | Qty: 30 | Fill #9

## 2020-06-04 ENCOUNTER — Other Ambulatory Visit: Payer: Self-pay

## 2020-06-04 DIAGNOSIS — R972 Elevated prostate specific antigen [PSA]: Secondary | ICD-10-CM

## 2020-06-05 LAB — PSA: Prostate Specific Ag, Serum: 44.3 ng/mL — ABNORMAL HIGH (ref 0.0–4.0)

## 2020-06-10 ENCOUNTER — Other Ambulatory Visit: Payer: Self-pay

## 2020-06-10 ENCOUNTER — Emergency Department (HOSPITAL_COMMUNITY)
Admission: EM | Admit: 2020-06-10 | Discharge: 2020-06-10 | Disposition: A | Discharge status: Home / Self Care | Payer: Self-pay | Attending: Emergency Medicine | Admitting: Emergency Medicine

## 2020-06-10 ENCOUNTER — Encounter (HOSPITAL_COMMUNITY): Payer: Self-pay | Admitting: Emergency Medicine

## 2020-06-10 ENCOUNTER — Emergency Department (HOSPITAL_COMMUNITY): Payer: Self-pay

## 2020-06-10 DIAGNOSIS — Z96651 Presence of right artificial knee joint: Secondary | ICD-10-CM | POA: Insufficient documentation

## 2020-06-10 DIAGNOSIS — I10 Essential (primary) hypertension: Secondary | ICD-10-CM | POA: Insufficient documentation

## 2020-06-10 DIAGNOSIS — Z87891 Personal history of nicotine dependence: Secondary | ICD-10-CM | POA: Insufficient documentation

## 2020-06-10 DIAGNOSIS — N390 Urinary tract infection, site not specified: Secondary | ICD-10-CM | POA: Insufficient documentation

## 2020-06-10 LAB — CBC
HCT: 45.2 % (ref 39.0–52.0)
Hemoglobin: 14.7 g/dL (ref 13.0–17.0)
MCH: 29.1 pg (ref 26.0–34.0)
MCHC: 32.5 g/dL (ref 30.0–36.0)
MCV: 89.5 fL (ref 80.0–100.0)
Platelets: 280 10*3/uL (ref 150–400)
RBC: 5.05 MIL/uL (ref 4.22–5.81)
RDW: 12.7 % (ref 11.5–15.5)
WBC: 16.2 10*3/uL — ABNORMAL HIGH (ref 4.0–10.5)
nRBC: 0 % (ref 0.0–0.2)

## 2020-06-10 LAB — URINALYSIS, ROUTINE W REFLEX MICROSCOPIC
Bilirubin Urine: NEGATIVE
Glucose, UA: NEGATIVE mg/dL
Hgb urine dipstick: NEGATIVE
Ketones, ur: NEGATIVE mg/dL
Nitrite: NEGATIVE
Protein, ur: NEGATIVE mg/dL
Specific Gravity, Urine: 1.025 (ref 1.005–1.030)
pH: 5 (ref 5.0–8.0)

## 2020-06-10 LAB — BASIC METABOLIC PANEL
Anion gap: 10 (ref 5–15)
BUN: 16 mg/dL (ref 6–20)
CO2: 25 mmol/L (ref 22–32)
Calcium: 9.1 mg/dL (ref 8.9–10.3)
Chloride: 99 mmol/L (ref 98–111)
Creatinine, Ser: 1.12 mg/dL (ref 0.61–1.24)
GFR calc Af Amer: >60 mL/min (ref >60)
GFR calc non Af Amer: >60 mL/min (ref >60)
Glucose, Bld: 144 mg/dL — ABNORMAL HIGH (ref 70–99)
Potassium: 3.6 mmol/L (ref 3.5–5.1)
Sodium: 134 mmol/L — ABNORMAL LOW (ref 135–145)

## 2020-06-10 MED ORDER — PHENAZOPYRIDINE HCL 200 MG PO TABS: 200.0000 mg | ORAL_TABLET | Freq: Three times a day (TID) | ORAL | 0 refills | Status: DC | PRN

## 2020-06-10 MED ORDER — CEPHALEXIN 500 MG PO CAPS: 500.0000 mg | ORAL_CAPSULE | Freq: Three times a day (TID) | ORAL | 0 refills | Status: AC

## 2020-06-10 MED ORDER — CEPHALEXIN 250 MG PO CAPS
500.0000 mg | ORAL_CAPSULE | Freq: Once | ORAL | Status: AC
  Administered 2020-06-10: 500 mg via ORAL
  Filled 2020-06-10: qty 2

## 2020-06-10 NOTE — Discharge Instructions (Addendum)
You were evaluated in the Emergency Department and after careful evaluation, we did not find any emergent condition requiring admission or further testing in the hospital.  Your exam/testing today is overall reassuring.  Your symptoms seem to be due to a urinary tract infection.  Please take the Keflex antibiotic as directed.  You can use the Pyridium medication as needed for burning with urination.  Please return to the Emergency Department if you experience any worsening of your condition.  We encourage you to follow up with a primary care provider.  Thank you for allowing [place] to be a part of your care.

## 2020-06-10 NOTE — ED Provider Notes (Signed)
[facility] Emergency Department Provider Note MRN:  [id number]  Arrival date & time: 06/10/20     Chief Complaint   Dysuria   History of Present Illness   [name] is a 54 y.o. year-old male with a history of hypertension, GERD presenting to the ED with chief complaint of dysuria.  Pain with urination since yesterday.  Last night also felt generally unwell with subjective fever and chills.  Denies nausea or vomiting, no cough, no chest pain or shortness of breath, no abdominal pain but has been experiencing right-sided flank pain for several months.  Pain is intermittent, moderate, no other exacerbating or alleviating factors.  Review of Systems  A complete 10 system review of systems was obtained and all systems are negative except as noted in the HPI and PMH.   Patient's Health History    Past Medical History:  Diagnosis Date  . Arthritis of knee, right   . GERD (gastroesophageal reflux disease)   . Hypertension     Past Surgical History:  Procedure Laterality Date  . CHOLECYSTECTOMY    . COLONOSCOPY  02/04/2006   Dr.Jacobs  . TOTAL KNEE ARTHROPLASTY Right 07/16/2018  . TOTAL KNEE ARTHROPLASTY Right 07/16/2018   Procedure: RIGHT TOTAL KNEE REPLACEMENT;  Surgeon: Marybelle Killings, MD;  Location: [place];  Service: Orthopedics;  Laterality: Right;    Family History  Problem Relation Age of Onset  . Cancer Father        prostate   . Colon cancer Father 83  . Cancer Maternal Grandfather        prostate  . Colon cancer Maternal Uncle   . Colon cancer Paternal Grandfather   . Esophageal cancer Neg Hx   . Rectal cancer Neg Hx   . Stomach cancer Neg Hx     Social History   Socioeconomic History  . Marital status: Married    Spouse name: Not on file  . Number of children: Not on file  . Years of education: Not on file  . Highest education level: Not on file  Occupational History  . Not on file  Tobacco Use  . Smoking status: Former Smoker     Quit date: 11/20/2015    Years since quitting: 4.5  . Smokeless tobacco: Never Used  Vaping Use  . Vaping Use: Never used  Substance and Sexual Activity  . Alcohol use: No  . Drug use: No  . Sexual activity: Yes  Other Topics Concern  . Not on file  Social History Narrative  . Not on file   Social Determinants of Health   Financial Resource Strain:   . Difficulty of Paying Living Expenses:   Food Insecurity:   . Worried About Charity fundraiser in the Last Year:   . Arboriculturist in the Last Year:   Transportation Needs:   . Film/video editor (Medical):   Marland Kitchen Lack of Transportation (Non-Medical):   Physical Activity:   . Days of Exercise per Week:   . Minutes of Exercise per Session:   Stress:   . Feeling of Stress :   Social Connections:   . Frequency of Communication with Friends and Family:   . Frequency of Social Gatherings with Friends and Family:   . Attends Religious Services:   . Active Member of Clubs or Organizations:   . Attends Archivist Meetings:   Marland Kitchen Marital Status:   Intimate Partner Violence:   . Fear of Current  or Ex-Partner:   . Emotionally Abused:   Marland Kitchen Physically Abused:   . Sexually Abused:      Physical Exam   Vitals:   06/10/20 2103 06/10/20 2221  BP: (!) 152/69 137/84  Pulse: 88 92  Resp: 20 18  Temp: 99.3 F (37.4 C)   SpO2: 98% 97%    CONSTITUTIONAL: Well-appearing, NAD NEURO:  Alert and oriented x 3, no focal deficits EYES:  eyes equal and reactive ENT/NECK:  no LAD, no JVD CARDIO: Tachycardic rate, well-perfused, normal S1 and S2 PULM:  CTAB no wheezing or rhonchi GI/GU:  normal bowel sounds, non-distended, non-tender MSK/SPINE:  No gross deformities, no edema SKIN:  no rash, atraumatic PSYCH:  Appropriate speech and behavior  *Additional and/or pertinent findings included in MDM below  Diagnostic and Interventional Summary    EKG Interpretation  Date/Time:    Ventricular Rate:    PR Interval:     QRS Duration:   QT Interval:    QTC Calculation:   R Axis:     Text Interpretation:        Labs Reviewed  URINALYSIS, ROUTINE W REFLEX MICROSCOPIC - Abnormal; Notable for the following components:      Result Value   Leukocytes,Ua TRACE (*)    Bacteria, UA RARE (*)    All other components within normal limits  BASIC METABOLIC PANEL - Abnormal; Notable for the following components:   Sodium 134 (*)    Glucose, Bld 144 (*)    All other components within normal limits  CBC - Abnormal; Notable for the following components:   WBC 16.2 (*)    All other components within normal limits  URINE CULTURE    CT RENAL STONE STUDY  Final Result      Medications  cephALEXin (KEFLEX) capsule 500 mg (has no administration in time range)     Procedures  /  Critical Care Procedures  ED Course and Medical Decision Making  I have reviewed the triage vital signs, the nursing notes, and pertinent available records from the EMR.  Listed above are laboratory and imaging tests that I personally ordered, reviewed, and interpreted and then considered in my medical decision making (see below for details).      Question of UTI, pyelonephritis, kidney stone, viral illness.  Leukocytosis noted.  Mildly hypothermic on oral temp, 99.3.  Normotensive, well-appearing.  Urinalysis with weak evidence to suggest infection.  Will CT to evaluate for other causes of impaired urination such as kidney stone, mass.  CT is without emergent process, no kidney stone, further evidence of cystitis.  Patient continues to be well-appearing, I suspect he has some degree of systemic symptoms with fever but not concerned for sepsis at this time.  Appropriate for discharge on antibiotics and strict return precautions.  Barth Kirks. Sedonia Small, MD [place] [email or web address]  Final Clinical Impressions(s) / ED Diagnoses     ICD-10-CM   1. Lower urinary tract infectious disease   N39.0     ED Discharge Orders         Ordered    cephALEXin (KEFLEX) 500 MG capsule  3 times daily     Discontinue  Reprint     06/10/20 2228    phenazopyridine (PYRIDIUM) 200 MG tablet  3 times daily PRN     Discontinue  Reprint     06/10/20 2228           Discharge Instructions Discussed with and Provided to Patient:  Discharge Instructions     You were evaluated in the Emergency Department and after careful evaluation, we did not find any emergent condition requiring admission or further testing in the hospital.  Your exam/testing today is overall reassuring.  Your symptoms seem to be due to a urinary tract infection.  Please take the Keflex antibiotic as directed.  You can use the Pyridium medication as needed for burning with urination.  Please return to the Emergency Department if you experience any worsening of your condition.  We encourage you to follow up with a primary care provider.  Thank you for allowing [place] to be a part of your care.       Maudie Flakes, MD 06/10/20 2230

## 2020-06-10 NOTE — ED Notes (Signed)
Pt verbalized understanding of d/c instructions, follow up care and s/s requiring return to ed. Pt had no further questions and was transported via wheelchair to exit.  

## 2020-06-10 NOTE — ED Triage Notes (Signed)
C/o pain with urination since yesterday.

## 2020-06-10 NOTE — ED Notes (Signed)
Pt to ct 

## 2020-06-11 ENCOUNTER — Other Ambulatory Visit: Payer: Self-pay | Admitting: Family Medicine

## 2020-06-11 DIAGNOSIS — R972 Elevated prostate specific antigen [PSA]: Secondary | ICD-10-CM

## 2020-06-11 DIAGNOSIS — B342 Coronavirus infection, unspecified: Secondary | ICD-10-CM

## 2020-06-12 ENCOUNTER — Telehealth: Payer: Self-pay | Admitting: Family Medicine

## 2020-06-12 LAB — URINE CULTURE: Culture: 60000 — AB

## 2020-06-12 NOTE — Telephone Encounter (Signed)
Patient informed of PSA results today.

## 2020-06-13 ENCOUNTER — Telehealth: Payer: Self-pay | Admitting: *Deleted

## 2020-06-13 NOTE — Telephone Encounter (Signed)
Post ED Visit - Positive Culture Follow-up  Culture report reviewed by antimicrobial stewardship pharmacist: [place] Team []  Elenor Quinones, Pharm.D. []  Heide Guile, Pharm.D., BCPS AQ-ID []  Parks Neptune, Pharm.D., BCPS []  Alycia Rossetti, Pharm.D., BCPS []  [place], Pharm.D., BCPS, AAHIVP []  Legrand Como, Pharm.D., BCPS, AAHIVP []  Salome Arnt, PharmD, BCPS []  Johnnette Gourd, PharmD, BCPS []  Hughes Better, PharmD, BCPS []  Leeroy Cha, PharmD [x]  Laqueta Linden, PharmD, BCPS []  Albertina Parr, PharmD  [place] Team []  Leodis Sias, PharmD []  Lindell Spar, PharmD []  Royetta Asal, PharmD []  Graylin Shiver, Rph []  Rema Fendt) Glennon Mac, PharmD []  Arlyn Dunning, PharmD []  Netta Cedars, PharmD []  Dia Sitter, PharmD []  Leone Haven, PharmD []  Gretta Arab, PharmD []  Theodis Shove, PharmD []  Peggyann Juba, PharmD []  Reuel Boom, PharmD   Positive   urine culture Treated with Cephalexin, organism sensitive to the same and no further patient follow-up is required at this time.  Harlon Flor [facility] 06/13/2020, 12:25 PM

## 2020-07-05 MED FILL — LOSARTAN-HCTZ 50-12.5 MG TA: 50-12.5 | 30 days supply | Qty: 30 | Fill #10

## 2020-08-09 ENCOUNTER — Other Ambulatory Visit: Payer: Self-pay | Admitting: Family Medicine

## 2020-08-09 DIAGNOSIS — I10 Essential (primary) hypertension: Secondary | ICD-10-CM

## 2020-08-09 MED FILL — LOSARTAN-HCTZ 50-12.5 MG TA: 50-12.5 | 30 days supply | Qty: 30 | Fill #0

## 2020-08-29 ENCOUNTER — Other Ambulatory Visit: Payer: Self-pay | Admitting: Nurse Practitioner

## 2020-08-29 ENCOUNTER — Encounter: Payer: Self-pay | Admitting: Nurse Practitioner

## 2020-08-29 ENCOUNTER — Other Ambulatory Visit: Payer: Self-pay

## 2020-08-29 ENCOUNTER — Ambulatory Visit (INDEPENDENT_AMBULATORY_CARE_PROVIDER_SITE_OTHER): Payer: Self-pay | Admitting: Nurse Practitioner

## 2020-08-29 VITALS — BP 145/83 | HR 78 | Temp 98.1°F | Ht 70.0 in | Wt 286.0 lb

## 2020-08-29 DIAGNOSIS — Z1159 Encounter for screening for other viral diseases: Secondary | ICD-10-CM

## 2020-08-29 DIAGNOSIS — N529 Male erectile dysfunction, unspecified: Secondary | ICD-10-CM

## 2020-08-29 DIAGNOSIS — R972 Elevated prostate specific antigen [PSA]: Secondary | ICD-10-CM

## 2020-08-29 DIAGNOSIS — I10 Essential (primary) hypertension: Secondary | ICD-10-CM

## 2020-08-29 DIAGNOSIS — Z6841 Body Mass Index (BMI) 40.0 and over, adult: Secondary | ICD-10-CM

## 2020-08-29 DIAGNOSIS — Z13220 Encounter for screening for lipoid disorders: Secondary | ICD-10-CM

## 2020-08-29 MED ORDER — LOSARTAN POTASSIUM-HCTZ 50-12.5 MG PO TABS: 1.0000 | ORAL_TABLET | Freq: Every day | ORAL | 3 refills | Status: DC

## 2020-08-29 MED ORDER — PANTOPRAZOLE SODIUM 20 MG PO TBEC: 20.0000 mg | DELAYED_RELEASE_TABLET | Freq: Every day | ORAL | 3 refills | Status: DC

## 2020-08-29 MED FILL — SILDENAFIL CITRATE 100 MG T: 100 | 15 days supply | Qty: 5 | Fill #1

## 2020-08-29 MED FILL — PANTOPRAZOLE SOD DR 20 MG T: 20 | 30 days supply | Qty: 30 | Fill #0

## 2020-08-29 NOTE — Progress Notes (Signed)
[place], [place]  [address] Phone:  [phone]   Fax:  [phone]    Established Patient Office Visit  Subjective:  Patient ID: [name], male    DOB: 07/07/1966  Age: 54 y.o. MRN: [id number]  CC:  Chief Complaint  Patient presents with   Follow-up    HPI [name] presents for follow up. He  has a past medical history of Arthritis of knee, right, GERD (gastroesophageal reflux disease), and Hypertension.   Hypertension Patient is here for follow-up of elevated blood pressure. He is not exercising and is not adherent to a low-salt diet. Blood pressure is not monitored at home. Cardiac symptoms: none. Patient denies chest pain, dyspnea, exertional chest pressure/discomfort, fatigue, irregular heart beat, lower extremity edema and palpitations. Cardiovascular risk factors: hypertension, male gender, obesity (BMI >= 30 kg/m2) and sedentary lifestyle. Use of agents associated with hypertension: NSAIDS. History of target organ damage: none.   He was referred by Kathe Becton FNP to Urology for evaluation of an elevated PSA. He admits that the medical records were not seen by urology. He did have an DRE at his apt. He states that it was deemed normal. He is unaware who he saw at the time of the visit. He denies additional follow up. He has no personal history of prostate cancer. He has a prior genitourinary history of prostatitis, UTI, erectile dysfunction.  GERD Paitent complains of heartburn. This has been associated with symptoms primarily relate to meals, and lying down after meals.  He denies abdominal bloating, belching and eructation, bilious reflux, chest pain, choking on food, cough, deep pressure at base of neck, difficulty swallowing, dysphagia, early satiety, hematemesis, hoarseness, laryngitis, melena, midespigastric pain, nausea, need to clear throat frequently, odynophagia, regurgitation of undigested food, shortness  of breath, unexpected weight loss, upper abdominal discomfort, waterbrash and wheezing. Symptoms have been present for several months. He denies dysphagia. He has not lost weight. He denies melena, hematochezia, hematemesis, and coffee ground emesis. Medical therapy in the past has included proton pump inhibitors omeprazole OTC with not much success .   Past Medical History:  Diagnosis Date   Arthritis of knee, right    GERD (gastroesophageal reflux disease)    Hypertension     Past Surgical History:  Procedure Laterality Date   CHOLECYSTECTOMY     COLONOSCOPY  02/04/2006   Dr.Jacobs   TOTAL KNEE ARTHROPLASTY Right 07/16/2018   TOTAL KNEE ARTHROPLASTY Right 07/16/2018   Procedure: RIGHT TOTAL KNEE REPLACEMENT;  Surgeon: Marybelle Killings, MD;  Location: [facility];  Service: Orthopedics;  Laterality: Right;    Family History  Problem Relation Age of Onset   Cancer Father        prostate    Colon cancer Father 79   Cancer Maternal Grandfather        prostate   Colon cancer Maternal Uncle    Colon cancer Paternal Grandfather    Esophageal cancer Neg Hx    Rectal cancer Neg Hx    Stomach cancer Neg Hx     Social History   Socioeconomic History   Marital status: Married    Spouse name: Not on file   Number of children: Not on file   Years of education: Not on file   Highest education level: Not on file  Occupational History   Not on file  Tobacco Use   Smoking status: Former Smoker    Quit date: 11/20/2015  Years since quitting: 4.7   Smokeless tobacco: Never Used  Vaping Use   Vaping Use: Never used  Substance and Sexual Activity   Alcohol use: No   Drug use: No   Sexual activity: Yes  Other Topics Concern   Not on file  Social History Narrative   Not on file   Social Determinants of Health   Financial Resource Strain:    Difficulty of Paying Living Expenses: Not on file  Food Insecurity:    Worried About [place] in the  Last Year: Not on file   Ran Out of Food in the Last Year: Not on file  Transportation Needs:    Lack of Transportation (Medical): Not on file   Lack of Transportation (Non-Medical): Not on file  Physical Activity:    Days of Exercise per Week: Not on file   Minutes of Exercise per Session: Not on file  Stress:    Feeling of Stress : Not on file  Social Connections:    Frequency of Communication with Friends and Family: Not on file   Frequency of Social Gatherings with Friends and Family: Not on file   Attends Religious Services: Not on file   Active Member of Clubs or Organizations: Not on file   Attends Archivist Meetings: Not on file   Marital Status: Not on file  Intimate Partner Violence:    Fear of Current or Ex-Partner: Not on file   Emotionally Abused: Not on file   Physically Abused: Not on file   Sexually Abused: Not on file    Outpatient Medications Prior to Visit  Medication Sig Dispense Refill   acetaminophen (TYLENOL) 500 MG tablet Take 500 mg by mouth every 6 (six) hours as needed.     aspirin EC 325 MG EC tablet Take 1 tablet (325 mg total) by mouth daily with breakfast. 30 tablet 0   cyclobenzaprine (FLEXERIL) 10 MG tablet Take 1 tablet (10 mg total) by mouth 2 (two) times daily as needed for muscle spasms. 20 tablet 0   ibuprofen (ADVIL) 600 MG tablet Take 1 tablet (600 mg total) by mouth every 6 (six) hours as needed. 30 tablet 0   sildenafil (VIAGRA) 100 MG tablet Take 1 tablet (100 mg total) by mouth daily as needed for erectile dysfunction. 10 tablet 3   losartan-hydrochlorothiazide (HYZAAR) 50-12.5 MG tablet TAKE 1 TABLET BY MOUTH DAILY. 30 tablet 3   phenazopyridine (PYRIDIUM) 200 MG tablet Take 1 tablet (200 mg total) by mouth 3 (three) times daily as needed for pain. (Patient not taking: Reported on 08/29/2020) 14 tablet 0   No facility-administered medications prior to visit.    No Known Allergies  ROS Review of  Systems  HENT: Negative.   Respiratory: Negative for chest tightness and shortness of breath.   Cardiovascular: Negative for chest pain and leg swelling.  Gastrointestinal: Negative for nausea.  Endocrine: Negative.   Genitourinary: Negative.   Neurological: Negative for dizziness and headaches.      Objective:    Physical Exam Constitutional:      General: He is not in acute distress.    Appearance: He is obese. He is not ill-appearing.  HENT:     Head: Normocephalic and atraumatic.     Nose: Nose normal.     Mouth/Throat:     Mouth: Mucous membranes are moist.     Pharynx: Oropharynx is clear.  Cardiovascular:     Rate and Rhythm: Normal rate and regular  rhythm.     Pulses: Normal pulses.     Heart sounds: Normal heart sounds.  Pulmonary:     Effort: Pulmonary effort is normal.     Breath sounds: Normal breath sounds.  Abdominal:     General: Bowel sounds are normal.  Musculoskeletal:     Cervical back: Normal range of motion.     Right lower leg: No edema.     Left lower leg: No edema.  Neurological:     General: No focal deficit present.     Mental Status: He is alert and oriented to person, place, and time.  Psychiatric:        Mood and Affect: Mood normal.        Behavior: Behavior normal.        Thought Content: Thought content normal.        Judgment: Judgment normal.     BP (!) 145/83    Pulse 78    Temp 98.1 F (36.7 C) (Temporal)    Ht 5\' 10"  (1.778 m)    Wt 286 lb (129.7 kg)    SpO2 97%    BMI 41.04 kg/m  Wt Readings from Last 3 Encounters:  08/29/20 286 lb (129.7 kg)  06/10/20 250 lb (113.4 kg)  02/27/20 278 lb (126.1 kg)     Health Maintenance Due  Topic Date Due   Hepatitis C Screening  Never done   INFLUENZA VACCINE  07/29/2020    There are no preventive care reminders to display for this patient.  Lab Results  Component Value Date   TSH 1.683 *Test methodology is 3rd generation TSH* 02/26/2010   Lab Results  Component Value Date    WBC 16.2 (H) 06/10/2020   HGB 14.7 06/10/2020   HCT 45.2 06/10/2020   MCV 89.5 06/10/2020   PLT 280 06/10/2020   Lab Results  Component Value Date   NA 134 (L) 06/10/2020   K 3.6 06/10/2020   CO2 25 06/10/2020   GLUCOSE 144 (H) 06/10/2020   BUN 16 06/10/2020   CREATININE 1.12 06/10/2020   BILITOT 0.4 05/30/2019   ALKPHOS 93 05/30/2019   AST 29 05/30/2019   ALT 52 (H) 05/30/2019   PROT 8.0 05/30/2019   ALBUMIN 4.6 05/30/2019   CALCIUM 9.1 06/10/2020   ANIONGAP 10 06/10/2020   Lab Results  Component Value Date   CHOL 144 05/30/2019   Lab Results  Component Value Date   HDL 58 05/30/2019   Lab Results  Component Value Date   LDLCALC 65 05/30/2019   Lab Results  Component Value Date   TRIG 107 05/30/2019   Lab Results  Component Value Date   CHOLHDL 1.9 09/04/2016   Lab Results  Component Value Date   HGBA1C 5.9 (A) 02/27/2020      Assessment & Plan:   Problem List Items Addressed This Visit      Cardiovascular and Mediastinum   Essential hypertension - Primary Encouraged on going compliance with current medication regimen Encouraged home monitoring and recording BP <130/80 Eating a heart-healthy diet with less salt Encouraged regular physical activity  Recommend Weight loss    Relevant Medications   losartan-hydrochlorothiazide (HYZAAR) 50-12.5 MG tablet   Other Relevant Orders   Comp. Metabolic Panel (12)     Other   Class 3 severe obesity due to excess calories with serious comorbidity and body mass index (BMI) of 40.0 to 44.9 in adult [facility]) Obesity with BMI and comorbidities as noted above.  Discussed proper  diet (low fat, low sodium, high fiber) with patient.   Discussed need for regular exercise (3 times per week, 20 minutes per session) with patient.    Erectile dysfunction    Other Visit Diagnoses    Elevated PSA, greater than or equal to 20 ng/ml     Will recheck PSA and contact urology if elevated for treatment options    Relevant  Orders   PSA   Encounter for hepatitis C screening test for low risk patient       Relevant Orders   Hepatitis C antibody   Screening for cholesterol level       Relevant Orders   Lipid panel      Meds ordered this encounter  Medications   pantoprazole (PROTONIX) 20 MG tablet    Sig: Take 1 tablet (20 mg total) by mouth daily.    Dispense:  90 tablet    Refill:  3    Order Specific Question:   Supervising Provider    Answer:   Tresa Garter [[id number]]   losartan-hydrochlorothiazide (HYZAAR) 50-12.5 MG tablet    Sig: Take 1 tablet by mouth daily.    Dispense:  90 tablet    Refill:  3    Order Specific Question:   Supervising Provider    Answer:   Tresa Garter [id number]    Follow-up: Return in about 3 months (around 11/28/2020).    Vevelyn Francois, NP

## 2020-08-30 ENCOUNTER — Encounter: Payer: Self-pay | Admitting: Nurse Practitioner

## 2020-08-30 LAB — COMP. METABOLIC PANEL (12)
AST: 28 IU/L (ref 0–40)
Albumin/Globulin Ratio: 1.3 (ref 1.2–2.2)
Albumin: 4.4 g/dL (ref 3.8–4.9)
Alkaline Phosphatase: 72 IU/L (ref 48–121)
BUN/Creatinine Ratio: 17 (ref 9–20)
BUN: 13 mg/dL (ref 6–24)
Bilirubin Total: 0.4 mg/dL (ref 0.0–1.2)
Calcium: 9.4 mg/dL (ref 8.7–10.2)
Chloride: 101 mmol/L (ref 96–106)
Creatinine, Ser: 0.76 mg/dL (ref 0.76–1.27)
GFR calc Af Amer: 120 mL/min/{1.73_m2} (ref >59)
GFR calc non Af Amer: 103 mL/min/{1.73_m2} (ref >59)
Globulin, Total: 3.3 g/dL (ref 1.5–4.5)
Glucose: 91 mg/dL (ref 65–99)
Potassium: 4.4 mmol/L (ref 3.5–5.2)
Sodium: 137 mmol/L (ref 134–144)
Total Protein: 7.7 g/dL (ref 6.0–8.5)

## 2020-08-30 LAB — LIPID PANEL
Chol/HDL Ratio: 2.3 ratio (ref 0.0–5.0)
Cholesterol, Total: 131 mg/dL (ref 100–199)
HDL: 58 mg/dL (ref >39)
LDL Chol Calc (NIH): 55 mg/dL (ref 0–99)
Triglycerides: 97 mg/dL (ref 0–149)
VLDL Cholesterol Cal: 18 mg/dL (ref 5–40)

## 2020-08-30 LAB — HEPATITIS C ANTIBODY: Hep C Virus Ab: >11 s/co ratio — ABNORMAL HIGH (ref 0.0–0.9)

## 2020-08-30 LAB — PSA: Prostate Specific Ag, Serum: 69.8 ng/mL — ABNORMAL HIGH (ref 0.0–4.0)

## 2020-08-31 ENCOUNTER — Other Ambulatory Visit: Payer: Self-pay | Admitting: Nurse Practitioner

## 2020-08-31 DIAGNOSIS — R768 Other specified abnormal immunological findings in serum: Secondary | ICD-10-CM

## 2020-08-31 DIAGNOSIS — R972 Elevated prostate specific antigen [PSA]: Secondary | ICD-10-CM

## 2020-09-12 MED FILL — LOSARTAN-HCTZ 50-12.5 MG TA: 50-12.5 | 30 days supply | Qty: 30 | Fill #1

## 2020-10-03 MED FILL — PANTOPRAZOLE SOD DR 20 MG T: 20 | 30 days supply | Qty: 30 | Fill #1

## 2020-10-10 ENCOUNTER — Other Ambulatory Visit: Payer: Self-pay | Admitting: Urology

## 2020-10-10 MED FILL — CIPROFLOXACIN HCL 500 MG TA: 500 | 4 days supply | Qty: 4 | Fill #0

## 2020-10-11 MED FILL — LOSARTAN-HCTZ 50-12.5 MG TA: 50-12.5 | 30 days supply | Qty: 30 | Fill #2

## 2020-10-19 ENCOUNTER — Telehealth: Payer: Self-pay

## 2020-10-19 NOTE — Telephone Encounter (Signed)
Sent referral to [facility].  Will call patient to schedule once the doctor reviews

## 2020-11-06 ENCOUNTER — Telehealth: Payer: Self-pay | Admitting: Nurse Practitioner

## 2020-11-06 NOTE — Telephone Encounter (Signed)
Pt was called concerning AWV w/ [facility]. LVm to call back

## 2020-11-13 MED FILL — LOSARTAN-HCTZ 50-12.5 MG TA: 50-12.5 | 30 days supply | Qty: 30 | Fill #3

## 2020-11-28 ENCOUNTER — Ambulatory Visit: Payer: Self-pay | Admitting: Nurse Practitioner

## 2020-11-28 MED FILL — PANTOPRAZOLE SOD DR 20 MG T: 20 | 30 days supply | Qty: 30 | Fill #2

## 2020-12-19 MED FILL — LOSARTAN-HCTZ 50-12.5 MG TA: 50-12.5 | 90 days supply | Qty: 90 | Fill #0

## 2020-12-28 MED FILL — PANTOPRAZOLE SOD DR 20 MG T: 20 | 30 days supply | Qty: 30 | Fill #3

## 2021-01-07 ENCOUNTER — Ambulatory Visit: Payer: Self-pay | Admitting: Nurse Practitioner

## 2021-01-23 ENCOUNTER — Encounter: Payer: Self-pay | Admitting: Nurse Practitioner

## 2021-01-23 ENCOUNTER — Ambulatory Visit (INDEPENDENT_AMBULATORY_CARE_PROVIDER_SITE_OTHER): Payer: Self-pay | Admitting: Nurse Practitioner

## 2021-01-23 ENCOUNTER — Other Ambulatory Visit: Payer: Self-pay

## 2021-01-23 VITALS — BP 141/74 | HR 97 | Temp 98.1°F | Ht 70.0 in | Wt 295.0 lb

## 2021-01-23 DIAGNOSIS — R7303 Prediabetes: Secondary | ICD-10-CM

## 2021-01-23 DIAGNOSIS — R768 Other specified abnormal immunological findings in serum: Secondary | ICD-10-CM

## 2021-01-23 DIAGNOSIS — R972 Elevated prostate specific antigen [PSA]: Secondary | ICD-10-CM

## 2021-01-23 DIAGNOSIS — N529 Male erectile dysfunction, unspecified: Secondary | ICD-10-CM

## 2021-01-23 DIAGNOSIS — Z6841 Body Mass Index (BMI) 40.0 and over, adult: Secondary | ICD-10-CM

## 2021-01-23 DIAGNOSIS — I10 Essential (primary) hypertension: Secondary | ICD-10-CM

## 2021-01-23 NOTE — Patient Instructions (Signed)
Managing Your Hypertension Hypertension, also called high blood pressure, is when the force of the blood pressing against the walls of the arteries is too strong. Arteries are blood vessels that carry blood from your heart throughout your body. Hypertension forces the heart to work harder to pump blood and may cause the arteries to become narrow or stiff. Understanding blood pressure readings Your personal target blood pressure may vary depending on your medical conditions, your age, and other factors. A blood pressure reading includes a higher number over a lower number. Ideally, your blood pressure should be below 120/80. You should know that:  The first, or top, number is called the systolic pressure. It is a measure of the pressure in your arteries as your heart beats.  The second, or bottom number, is called the diastolic pressure. It is a measure of the pressure in your arteries as the heart relaxes. Blood pressure is classified into four stages. Based on your blood pressure reading, your health care provider may use the following stages to determine what type of treatment you need, if any. Systolic pressure and diastolic pressure are measured in a unit called mmHg. Normal  Systolic pressure: below 120.  Diastolic pressure: below 80. Elevated  Systolic pressure: 120-129.  Diastolic pressure: below 80. Hypertension stage 1  Systolic pressure: 130-139.  Diastolic pressure: 80-89. Hypertension stage 2  Systolic pressure: 140 or above.  Diastolic pressure: 90 or above. How can this condition affect me? Managing your hypertension is an important responsibility. Over time, hypertension can damage the arteries and decrease blood flow to important parts of the body, including the brain, heart, and kidneys. Having untreated or uncontrolled hypertension can lead to:  A heart attack.  A stroke.  A weakened blood vessel (aneurysm).  Heart failure.  Kidney damage.  Eye  damage.  Metabolic syndrome.  Memory and concentration problems.  Vascular dementia. What actions can I take to manage this condition? Hypertension can be managed by making lifestyle changes and possibly by taking medicines. Your health care provider will help you make a plan to bring your blood pressure within a normal range. Nutrition  Eat a diet that is high in fiber and potassium, and low in salt (sodium), added sugar, and fat. An example eating plan is called the Dietary Approaches to Stop Hypertension (DASH) diet. To eat this way: ? Eat plenty of fresh fruits and vegetables. Try to fill one-half of your plate at each meal with fruits and vegetables. ? Eat whole grains, such as whole-wheat pasta, brown rice, or whole-grain bread. Fill about one-fourth of your plate with whole grains. ? Eat low-fat dairy products. ? Avoid fatty cuts of meat, processed or cured meats, and poultry with skin. Fill about one-fourth of your plate with lean proteins such as fish, chicken without skin, beans, eggs, and tofu. ? Avoid pre-made and processed foods. These tend to be higher in sodium, added sugar, and fat.  Reduce your daily sodium intake. Most people with hypertension should eat less than 1,500 mg of sodium a day.   Lifestyle  Work with your health care provider to maintain a healthy body weight or to lose weight. Ask what an ideal weight is for you.  Get at least 30 minutes of exercise that causes your heart to beat faster (aerobic exercise) most days of the week. Activities may include walking, swimming, or biking.  Include exercise to strengthen your muscles (resistance exercise), such as weight lifting, as part of your weekly exercise routine. Try   to do these types of exercises for 30 minutes at least 3 days a week.  Do not use any products that contain nicotine or tobacco, such as cigarettes, e-cigarettes, and chewing tobacco. If you need help quitting, ask your health care  provider.  Control any long-term (chronic) conditions you have, such as high cholesterol or diabetes.  Identify your sources of stress and find ways to manage stress. This may include meditation, deep breathing, or making time for fun activities.   Alcohol use  Do not drink alcohol if: ? Your health care provider tells you not to drink. ? You are pregnant, may be pregnant, or are planning to become pregnant.  If you drink alcohol: ? Limit how much you use to:  0-1 drink a day for women.  0-2 drinks a day for men. ? Be aware of how much alcohol is in your drink. In the [place] one drink equals one 12 oz bottle of beer (355 mL), one 5 oz glass of wine (148 mL), or one 1 oz glass of hard liquor (44 mL). Medicines Your health care provider may prescribe medicine if lifestyle changes are not enough to get your blood pressure under control and if:  Your systolic blood pressure is 130 or higher.  Your diastolic blood pressure is 80 or higher. Take medicines only as told by your health care provider. Follow the directions carefully. Blood pressure medicines must be taken as told by your health care provider. The medicine does not work as well when you skip doses. Skipping doses also puts you at risk for problems. Monitoring Before you monitor your blood pressure:  Do not smoke, drink caffeinated beverages, or exercise within 30 minutes before taking a measurement.  Use the bathroom and empty your bladder (urinate).  Sit quietly for at least 5 minutes before taking measurements. Monitor your blood pressure at home as told by your health care provider. To do this:  Sit with your back straight and supported.  Place your feet flat on the floor. Do not cross your legs.  Support your arm on a flat surface, such as a table. Make sure your upper arm is at heart level.  Each time you measure, take two or three readings one minute apart and record the results. You may also need to have your  blood pressure checked regularly by your health care provider.   General information  Talk with your health care provider about your diet, exercise habits, and other lifestyle factors that may be contributing to hypertension.  Review all the medicines you take with your health care provider because there may be side effects or interactions.  Keep all visits as told by your health care provider. Your health care provider can help you create and adjust your plan for managing your high blood pressure. Where to find more information  National Heart, Lung, and Blood Institute: www.nhlbi.nih.gov  American Heart Association: www.heart.org Contact a health care provider if:  You think you are having a reaction to medicines you have taken.  You have repeated (recurrent) headaches.  You feel dizzy.  You have swelling in your ankles.  You have trouble with your vision. Get help right away if:  You develop a severe headache or confusion.  You have unusual weakness or numbness, or you feel faint.  You have severe pain in your chest or abdomen.  You vomit repeatedly.  You have trouble breathing. These symptoms may represent a serious problem that is an emergency. Do not wait   to see if the symptoms will go away. Get medical help right away. Call your local emergency services (911 in the [place] Do not drive yourself to the hospital. Summary  Hypertension is when the force of blood pumping through your arteries is too strong. If this condition is not controlled, it may put you at risk for serious complications.  Your personal target blood pressure may vary depending on your medical conditions, your age, and other factors. For most people, a normal blood pressure is less than 120/80.  Hypertension is managed by lifestyle changes, medicines, or both.  Lifestyle changes to help manage hypertension include losing weight, eating a healthy, low-sodium diet, exercising more, stopping smoking, and  limiting alcohol. This information is not intended to replace advice given to you by your health care provider. Make sure you discuss any questions you have with your health care provider. Document Revised: 01/20/2020 Document Reviewed: 11/15/2019 Elsevier Patient Education  2021 [employer]  

## 2021-01-26 NOTE — Progress Notes (Signed)
[place], [place]  [address] Phone:  [phone]   Fax:  [phone]   Established Patient Office Visit  Subjective:  Patient ID: [name], male    DOB: 1966-07-10  Age: 55 y.o. MRN: [id number]  CC:  Chief Complaint  Patient presents with  . Follow-up    3 follow up     HPI [name] presents for follow He  has a past medical history of Arthritis of knee, right, GERD (gastroesophageal reflux disease), and Hypertension.   Hypertension Patient is here for follow-up of elevated blood pressure. He is not exercising and is adherent to a low-salt diet. Blood pressure is not monitored at home. Cardiac symptoms: none. Patient denies chest pain, claudication, fatigue, irregular heart beat, lower extremity edema and syncope. Cardiovascular risk factors: advanced age (older than 65 for men, 66 for women), hypertension, male gender, obesity (BMI >= 30 kg/m2) and sedentary lifestyle. Use of agents associated with hypertension: none. History of target organ damage: none.  He has an elevated PSA and was seen by urology. He admits that is awaiting a biposy for further evaluation.   He also tested positive for Hepatitis C and was referred to the [facility]. He has not completed this apportionment at this time.   Past Medical History:  Diagnosis Date  . Arthritis of knee, right   . GERD (gastroesophageal reflux disease)   . Hypertension     Past Surgical History:  Procedure Laterality Date  . CHOLECYSTECTOMY    . COLONOSCOPY  02/04/2006   Dr.Jacobs  . TOTAL KNEE ARTHROPLASTY Right 07/16/2018  . TOTAL KNEE ARTHROPLASTY Right 07/16/2018   Procedure: RIGHT TOTAL KNEE REPLACEMENT;  Surgeon: Marybelle Killings, MD;  Location: [place];  Service: Orthopedics;  Laterality: Right;    Family History  Problem Relation Age of Onset  . Cancer Father        prostate   . Colon cancer Father 19  . Cancer Maternal Grandfather        prostate   . Colon cancer Maternal Uncle   . Colon cancer Paternal Grandfather   . Esophageal cancer Neg Hx   . Rectal cancer Neg Hx   . Stomach cancer Neg Hx     Social History   Socioeconomic History  . Marital status: Married    Spouse name: Not on file  . Number of children: Not on file  . Years of education: Not on file  . Highest education level: Not on file  Occupational History  . Not on file  Tobacco Use  . Smoking status: Former Smoker    Quit date: 11/20/2015    Years since quitting: 5.1  . Smokeless tobacco: Never Used  Vaping Use  . Vaping Use: Never used  Substance and Sexual Activity  . Alcohol use: No  . Drug use: No  . Sexual activity: Yes  Other Topics Concern  . Not on file  Social History Narrative  . Not on file   Social Determinants of Health   Financial Resource Strain: Not on file  Food Insecurity: Not on file  Transportation Needs: Not on file  Physical Activity: Not on file  Stress: Not on file  Social Connections: Not on file  Intimate Partner Violence: Not on file    Outpatient Medications Prior to Visit  Medication Sig Dispense Refill  . acetaminophen (TYLENOL) 500 MG tablet Take 500 mg by mouth every 6 (six) hours  as needed.    Marland Kitchen aspirin EC 325 MG EC tablet Take 1 tablet (325 mg total) by mouth daily with breakfast. 30 tablet 0  . losartan-hydrochlorothiazide (HYZAAR) 50-12.5 MG tablet Take 1 tablet by mouth daily. 90 tablet 3  . pantoprazole (PROTONIX) 20 MG tablet Take 1 tablet (20 mg total) by mouth daily. 90 tablet 3  . sildenafil (VIAGRA) 100 MG tablet Take 1 tablet (100 mg total) by mouth daily as needed for erectile dysfunction. 10 tablet 3  . cyclobenzaprine (FLEXERIL) 10 MG tablet Take 1 tablet (10 mg total) by mouth 2 (two) times daily as needed for muscle spasms. 20 tablet 0  . ibuprofen (ADVIL) 600 MG tablet Take 1 tablet (600 mg total) by mouth every 6 (six) hours as needed. 30 tablet 0   No facility-administered medications  prior to visit.    No Known Allergies  ROS Review of Systems    Objective:    Physical Exam Constitutional:      General: He is not in acute distress.    Appearance: He is obese. He is not ill-appearing, toxic-appearing or diaphoretic.  HENT:     Head: Normocephalic and atraumatic.     Nose: Nose normal.     Mouth/Throat:     Mouth: Mucous membranes are moist.  Cardiovascular:     Rate and Rhythm: Normal rate and regular rhythm.     Pulses: Normal pulses.     Heart sounds: Normal heart sounds.  Pulmonary:     Effort: Pulmonary effort is normal.     Breath sounds: Normal breath sounds.  Abdominal:     Palpations: Abdomen is soft.  Musculoskeletal:     Cervical back: Normal range of motion.  Skin:    General: Skin is warm and dry.     Capillary Refill: Capillary refill takes less than 2 seconds.  Neurological:     General: No focal deficit present.     Mental Status: He is alert and oriented to person, place, and time.  Psychiatric:        Mood and Affect: Mood normal.        Behavior: Behavior normal.        Thought Content: Thought content normal.        Judgment: Judgment normal.     BP (!) 141/74 (BP Location: Left Arm, Patient Position: Sitting, Cuff Size: Large)   Pulse 97   Temp 98.1 F (36.7 C) (Temporal)   Ht 5\' 10"  (1.778 m)   Wt 295 lb (133.8 kg)   SpO2 95%   BMI 42.33 kg/m  Wt Readings from Last 3 Encounters:  01/23/21 295 lb (133.8 kg)  08/29/20 286 lb (129.7 kg)  06/10/20 250 lb (113.4 kg)     Health Maintenance Due  Topic Date Due  . COVID-19 Vaccine (3 - Booster for Pfizer series) 09/15/2020    There are no preventive care reminders to display for this patient.   Lab Results  Component Value Date   WBC 16.2 (H) 06/10/2020   HGB 14.7 06/10/2020   HCT 45.2 06/10/2020   MCV 89.5 06/10/2020   PLT 280 06/10/2020   Lab Results  Component Value Date   NA 137 08/29/2020   K 4.4 08/29/2020   CO2 25 06/10/2020   GLUCOSE 91  08/29/2020   BUN 13 08/29/2020   CREATININE 0.76 08/29/2020   BILITOT 0.4 08/29/2020   ALKPHOS 72 08/29/2020   AST 28 08/29/2020   ALT 52 (H) 05/30/2019  PROT 7.7 08/29/2020   ALBUMIN 4.4 08/29/2020   CALCIUM 9.4 08/29/2020   ANIONGAP 10 06/10/2020   Lab Results  Component Value Date   CHOL 131 08/29/2020   Lab Results  Component Value Date   HDL 58 08/29/2020   Lab Results  Component Value Date   LDLCALC 55 08/29/2020   Lab Results  Component Value Date   TRIG 97 08/29/2020   Lab Results  Component Value Date   CHOLHDL 2.3 08/29/2020   Lab Results  Component Value Date   HGBA1C 5.9 (A) 02/27/2020      Assessment & Plan:   Problem List Items Addressed This Visit      Cardiovascular and Mediastinum   Essential hypertension - Primary Stable Encouraged on going compliance with current medication regimen Encouraged home monitoring and recording BP <130/80 Eating a heart-healthy diet with less salt Encouraged regular physical activity  Recommend Weight loss      Other   Class 3 severe obesity due to excess calories with serious comorbidity and body mass index (BMI) of 40.0 to 44.9 in adult ([place]) Obesity with BMI and comorbidities as noted above.  Discussed proper diet (low fat, low sodium, high fiber) with patient.  Discussed need for regular exercise (3 times per week, 20 minutes per session) with patient.    Erectile dysfunction   Prediabetes    Other Visit Diagnoses    Elevated PSA, greater than or equal to 20 ng/ml     Persistent Biopsy pending   Hepatitis C antibody positive in blood   Discussed the importance of getting appointment set up to start treatment.         No orders of the defined types were placed in this encounter.   Follow-up: Return in about 3 months (around 04/23/2021).    Vevelyn Francois, NP

## 2021-02-01 MED FILL — PANTOPRAZOLE SOD DR 20 MG T: 20 | 30 days supply | Qty: 30 | Fill #4

## 2021-02-15 ENCOUNTER — Other Ambulatory Visit: Payer: Self-pay | Admitting: Urology

## 2021-02-15 MED FILL — levoFLOXacin 750 MG TABS: 750 | 1 days supply | Qty: 1 | Fill #0

## 2021-03-20 ENCOUNTER — Other Ambulatory Visit: Payer: Self-pay | Admitting: Urology

## 2021-03-20 MED FILL — levoFLOXacin 750 MG TABS: 750 | 1 days supply | Qty: 1 | Fill #0

## 2021-03-26 ENCOUNTER — Other Ambulatory Visit: Payer: Self-pay | Admitting: Urology

## 2021-03-26 MED FILL — PANTOPRAZOLE SOD DR 20 MG T: 20 | 30 days supply | Qty: 30 | Fill #5

## 2021-03-26 MED FILL — levoFLOXacin 750 MG TABS: 750 | 1 days supply | Qty: 1 | Fill #0

## 2021-03-30 ENCOUNTER — Other Ambulatory Visit: Payer: Self-pay

## 2021-04-09 ENCOUNTER — Other Ambulatory Visit: Payer: Self-pay | Admitting: Urology

## 2021-04-09 DIAGNOSIS — C61 Malignant neoplasm of prostate: Secondary | ICD-10-CM

## 2021-04-15 ENCOUNTER — Other Ambulatory Visit: Payer: Self-pay

## 2021-04-15 MED FILL — Losartan Potassium & Hydrochlorothiazide Tab 50-12.5 MG: ORAL | 30 days supply | Qty: 30 | Fill #0 | Status: AC

## 2021-04-16 ENCOUNTER — Other Ambulatory Visit: Payer: Self-pay

## 2021-04-24 ENCOUNTER — Other Ambulatory Visit: Payer: Self-pay

## 2021-04-24 ENCOUNTER — Encounter: Payer: Self-pay | Admitting: Nurse Practitioner

## 2021-04-24 ENCOUNTER — Ambulatory Visit (INDEPENDENT_AMBULATORY_CARE_PROVIDER_SITE_OTHER): Payer: Self-pay | Admitting: Nurse Practitioner

## 2021-04-24 VITALS — BP 150/90 | Temp 98.1°F | Ht 70.0 in | Wt 293.6 lb

## 2021-04-24 DIAGNOSIS — R7303 Prediabetes: Secondary | ICD-10-CM

## 2021-04-24 DIAGNOSIS — G8929 Other chronic pain: Secondary | ICD-10-CM

## 2021-04-24 DIAGNOSIS — M5441 Lumbago with sciatica, right side: Secondary | ICD-10-CM

## 2021-04-24 DIAGNOSIS — C61 Malignant neoplasm of prostate: Secondary | ICD-10-CM

## 2021-04-24 DIAGNOSIS — I10 Essential (primary) hypertension: Secondary | ICD-10-CM

## 2021-04-24 DIAGNOSIS — Z130 Encounter for screening for diseases of the blood and blood-forming organs and certain disorders involving the immune mechanism: Secondary | ICD-10-CM

## 2021-04-24 MED ORDER — TRAMADOL HCL 50 MG PO TABS: 50.0000 mg | ORAL_TABLET | Freq: Four times a day (QID) | ORAL | 0 refills | Status: DC | PRN

## 2021-04-24 MED ORDER — IBUPROFEN 800 MG PO TABS
800.0000 mg | ORAL_TABLET | Freq: Three times a day (TID) | ORAL | 0 refills | Status: AC | PRN
  Filled 2021-04-24: qty 42, 14d supply, fill #0

## 2021-04-24 MED ORDER — TRAMADOL HCL 50 MG PO TABS: 50.0000 mg | ORAL_TABLET | Freq: Every evening | ORAL | 0 refills | Status: AC | PRN

## 2021-04-24 NOTE — Progress Notes (Signed)
[facility] Patient [facility] [address] [place], [place]  [address] Phone:  [phone]   Fax:  [phone]   Established Patient Office Visit  Subjective:  Patient ID: [name], male    DOB: 03/07/1966  Age: 55 y.o. MRN: [id number]  CC:  Chief Complaint  Patient presents with  . Follow-up    Follow up htn , having lower back pain taking otc pain medication for the pain , not helping much , using topical cream at night . Going on for about 1 year  gotten worse over the  last couple day. Follow up urology and was told that he has prostate cancer.    HPI [name] presents for low back pain. He  has a past medical history of Arthritis of knee, right, Cancer (HCC), GERD (gastroesophageal reflux disease), and Hypertension.   He is in today for follow up. He was just diagnosed with prostate cancer. He was referred to urology for elevated PSA in Sept. 2021> 7 mons ago. Which was one of 2 levels > 40 ng/ml; 44 and 69.8 . He is uninsured and had to bring cash to be treated. He has a strong familiar history of prostate cancer. He denies any urinary symptoms or changes. He reports that psychologically  he is doing well.  He has faith and this is allowing him to get through this time. He is waiting for his bone scan and CT scan. He is current applying for financial assistance.   Back Pain  Patient presents for evaluation of low back problems. He reports that the back pain has been going on but he failed to complain at his previous visits. Symptoms have been present for several months and include numbness in back and down back of right leg , pain in lower back and right leg with occasional shift to the left side (aching and dull in character; 8/10 in severity) and weakness in back and leg. Initial inciting event: none. Symptoms are worse at nighttime however seen during the day. Alleviating factors identifiable by the patient are none. Aggravating factors identifiable by the patient  are sitting and standing. Treatments initiated by the patient: APAP and biofreeze Previous lower back problems: none. Previous work up: none. Previous treatments: none.   Past Medical History:  Diagnosis Date  . Arthritis of knee, right   . Cancer [facility])    prostate   . GERD (gastroesophageal reflux disease)   . Hypertension     Past Surgical History:  Procedure Laterality Date  . CHOLECYSTECTOMY    . COLONOSCOPY  02/04/2006   Dr.Jacobs  . TOTAL KNEE ARTHROPLASTY Right 07/16/2018  . TOTAL KNEE ARTHROPLASTY Right 07/16/2018   Procedure: RIGHT TOTAL KNEE REPLACEMENT;  Surgeon: Eldred Manges, MD;  Location: [facility] OR;  Service: Orthopedics;  Laterality: Right;    Family History  Problem Relation Age of Onset  . Cancer Father        prostate   . Colon cancer Father 37  . Cancer Maternal Grandfather        prostate  . Colon cancer Maternal Uncle   . Colon cancer Paternal Grandfather   . Esophageal cancer Neg Hx   . Rectal cancer Neg Hx   . Stomach cancer Neg Hx     Social History   Socioeconomic History  . Marital status: Married    Spouse name: Not on file  . Number of children: Not on file  . Years of education: Not on  file  . Highest education level: Not on file  Occupational History  . Not on file  Tobacco Use  . Smoking status: Former Smoker    Quit date: 11/20/2015    Years since quitting: 5.4  . Smokeless tobacco: Never Used  Vaping Use  . Vaping Use: Never used  Substance and Sexual Activity  . Alcohol use: No  . Drug use: No  . Sexual activity: Yes  Other Topics Concern  . Not on file  Social History Narrative  . Not on file   Social Determinants of Health   Financial Resource Strain: Not on file  Food Insecurity: Not on file  Transportation Needs: Not on file  Physical Activity: Not on file  Stress: Not on file  Social Connections: Not on file  Intimate Partner Violence: Not on file    Outpatient Medications Prior to Visit  Medication Sig  Dispense Refill  . acetaminophen (TYLENOL) 500 MG tablet Take 500 mg by mouth every 6 (six) hours as needed.    Marland Kitchen aspirin EC 325 MG EC tablet Take 1 tablet (325 mg total) by mouth daily with breakfast. 30 tablet 0  . losartan-hydrochlorothiazide (HYZAAR) 50-12.5 MG tablet TAKE 1 TABLET BY MOUTH DAILY. 90 tablet 3  . pantoprazole (PROTONIX) 20 MG tablet TAKE 1 TABLET (20 MG TOTAL) BY MOUTH DAILY. 90 tablet 3  . sildenafil (VIAGRA) 100 MG tablet Take 1 tablet (100 mg total) by mouth daily as needed for erectile dysfunction. 10 tablet 3  . ciprofloxacin (CIPRO) 500 MG tablet TAKE 1 TABLET BY MOUTH AS DIRECTED 4 tablet 0  . levofloxacin (LEVAQUIN) 750 MG tablet TAKE 1 TAB BY MOUTH 1 HOUR PRIOR TO PROCEDURE 1 tablet 0  . levofloxacin (LEVAQUIN) 750 MG tablet TAKE 1 TAB PO 1 HOUR PRIOR TO PROCEDURE 1 tablet 0  . levofloxacin (LEVAQUIN) 750 MG tablet TAKE 1 TABLET BY MOUTH 1 HOUR PRIOR TO PROCEDURE 1 tablet 0   No facility-administered medications prior to visit.    No Known Allergies  ROS Review of Systems    Objective:    Physical Exam Constitutional:      General: He is not in acute distress.    Appearance: He is obese. He is not ill-appearing, toxic-appearing or diaphoretic.  HENT:     Head: Normocephalic and atraumatic.  Cardiovascular:     Rate and Rhythm: Normal rate and regular rhythm.     Pulses: Normal pulses.     Heart sounds: Normal heart sounds.  Pulmonary:     Effort: Pulmonary effort is normal.     Breath sounds: Normal breath sounds.  Abdominal:     Comments: Increased abdominal girth   Musculoskeletal:        General: No tenderness (non palable). Normal range of motion.     Cervical back: Normal range of motion.     Right lower leg: No edema.     Left lower leg: No edema.  Skin:    General: Skin is warm and dry.     Capillary Refill: Capillary refill takes less than 2 seconds.  Neurological:     General: No focal deficit present.     Mental Status: He is  alert and oriented to person, place, and time.  Psychiatric:        Mood and Affect: Mood normal.        Behavior: Behavior normal.        Thought Content: Thought content normal.  Judgment: Judgment normal.     BP (!) 150/90   Temp 98.1 F (36.7 C) (Temporal)   Ht $R'5\' 10"'FU$  (1.778 m)   Wt 293 lb 9.6 oz (133.2 kg)   BMI 42.13 kg/m  Wt Readings from Last 3 Encounters:  04/24/21 293 lb 9.6 oz (133.2 kg)  01/23/21 295 lb (133.8 kg)  08/29/20 286 lb (129.7 kg)     There are no preventive care reminders to display for this patient.  There are no preventive care reminders to display for this patient.  Lab Results  Component Value Date   TSH 1.683  02/26/2010   Lab Results  Component Value Date   WBC 11.1 (H) 04/24/2021   HGB 15.1 04/24/2021   HCT 45.4 04/24/2021   MCV 84 04/24/2021   PLT 285 04/24/2021   Lab Results  Component Value Date   NA 139 04/24/2021   K 5.1 04/24/2021   CO2 25 06/10/2020   GLUCOSE 98 04/24/2021   BUN 13 04/24/2021   CREATININE 1.07 04/24/2021   BILITOT 0.5 04/24/2021   ALKPHOS 85 04/24/2021   AST 25 04/24/2021   ALT 52 (H) 05/30/2019   PROT 8.0 04/24/2021   ALBUMIN 4.2 04/24/2021   CALCIUM 9.7 04/24/2021   ANIONGAP 10 06/10/2020   EGFR 82 04/24/2021   Lab Results  Component Value Date   CHOL 138 04/24/2021   Lab Results  Component Value Date   HDL 61 04/24/2021   Lab Results  Component Value Date   LDLCALC 63 04/24/2021   Lab Results  Component Value Date   TRIG 72 04/24/2021   Lab Results  Component Value Date   CHOLHDL 2.3 04/24/2021   Lab Results  Component Value Date   HGBA1C 5.9 (A) 02/27/2020      Assessment & Plan:   Problem List Items Addressed This Visit      Cardiovascular and Mediastinum   Essential hypertension - Primary Stable slightly elevated today related to pain and recent diagnoses; stress Encouraged on going compliance with current medication regimen Encouraged home monitoring and  recording BP <130/80 Eating a heart-healthy diet with less salt Encouraged regular physical activity  Recommend Weight loss      Relevant Orders   Comp. Metabolic Panel (12)     Other   Prediabetes Stable at 5.9% Consider home glucose monitoring Weight loss at least 5% of current body weight is can be achieved with lifestyle modification dietary changes and regular daily exercise Encourage blood pressure control goal <120/80 and maintaining total cholesterol <200 Follow-up every 3 to 6 months for reevaluation Education material provided   Relevant Orders   Lipid panel   POCT urinalysis dipstick    Other Visit Diagnoses    Chronic bilateral low back pain with right-sided sciatica     Worsening  Trial [employer] and Tramadol  Has failed Muscle relaxer in the past does not like the sedation    Relevant Medications   ibuprofen (ADVIL) 800 MG tablet   traMADol (ULTRAM) 50 MG tablet   Prostate cancer [facility])     Currently awaiting staging    Screening for iron deficiency anemia   Evaluation underway     Relevant Orders   CBC with Differential/Platelet (Completed)      Meds ordered this encounter  Medications  . ibuprofen (ADVIL) 800 MG tablet    Sig: Take 1 tablet (800 mg total) by mouth every 8 (eight) hours as needed for up to 14 days for moderate pain. Take  on full stomach.    Dispense:  42 tablet    Refill:  0    Order Specific Question:   Supervising Provider    Answer:   Tresa Garter [id number]  . DISCONTD: traMADol (ULTRAM) 50 MG tablet    Sig: Take 1 tablet (50 mg total) by mouth every 6 (six) hours as needed for severe pain.    Dispense:  120 tablet    Refill:  0    Fill one day early if pharmacy is closed on scheduled refill date. Do not fill until:  To last until:    Order Specific Question:   Supervising Provider    Answer:   Tresa Garter [[id number]]  . DISCONTD: traMADol (ULTRAM) 50 MG tablet    Sig: Take 1 tablet (50 mg total) by mouth every 6 (six)  hours as needed for severe pain.    Dispense:  120 tablet    Refill:  0    Fill one day early if pharmacy is closed on scheduled refill date. Do not fill until:  To last until:    Order Specific Question:   Supervising Provider    Answer:   Tresa Garter [[id number]]  . traMADol (ULTRAM) 50 MG tablet    Sig: Take 1 tablet (50 mg total) by mouth at bedtime as needed for severe pain.    Dispense:  45 tablet    Refill:  0    Order Specific Question:   Supervising Provider    Answer:   Tresa Garter [id number]    Follow-up: No follow-ups on file.    Vevelyn Francois, NP

## 2021-04-25 ENCOUNTER — Other Ambulatory Visit: Payer: Self-pay

## 2021-04-25 ENCOUNTER — Encounter: Payer: Self-pay | Admitting: Nurse Practitioner

## 2021-04-25 LAB — COMP. METABOLIC PANEL (12)
AST: 25 IU/L (ref 0–40)
Albumin/Globulin Ratio: 1.1 — ABNORMAL LOW (ref 1.2–2.2)
Albumin: 4.2 g/dL (ref 3.8–4.9)
Alkaline Phosphatase: 85 IU/L (ref 44–121)
BUN/Creatinine Ratio: 12 (ref 9–20)
BUN: 13 mg/dL (ref 6–24)
Bilirubin Total: 0.5 mg/dL (ref 0.0–1.2)
Calcium: 9.7 mg/dL (ref 8.7–10.2)
Chloride: 103 mmol/L (ref 96–106)
Creatinine, Ser: 1.07 mg/dL (ref 0.76–1.27)
Globulin, Total: 3.8 g/dL (ref 1.5–4.5)
Glucose: 98 mg/dL (ref 65–99)
Potassium: 5.1 mmol/L (ref 3.5–5.2)
Sodium: 139 mmol/L (ref 134–144)
Total Protein: 8 g/dL (ref 6.0–8.5)
eGFR: 82 mL/min/{1.73_m2} (ref >59)

## 2021-04-25 LAB — CBC WITH DIFFERENTIAL/PLATELET
Basophils Absolute: 0.1 10*3/uL (ref 0.0–0.2)
Basos: 1 %
EOS (ABSOLUTE): 0.1 10*3/uL (ref 0.0–0.4)
Eos: 1 %
Hematocrit: 45.4 % (ref 37.5–51.0)
Hemoglobin: 15.1 g/dL (ref 13.0–17.7)
Immature Grans (Abs): 0.1 10*3/uL (ref 0.0–0.1)
Immature Granulocytes: 1 %
Lymphocytes Absolute: 2.7 10*3/uL (ref 0.7–3.1)
Lymphs: 25 %
MCH: 27.9 pg (ref 26.6–33.0)
MCHC: 33.3 g/dL (ref 31.5–35.7)
MCV: 84 fL (ref 79–97)
Monocytes Absolute: 1 10*3/uL — ABNORMAL HIGH (ref 0.1–0.9)
Monocytes: 9 %
Neutrophils Absolute: 7.1 10*3/uL — ABNORMAL HIGH (ref 1.4–7.0)
Neutrophils: 63 %
Platelets: 285 10*3/uL (ref 150–450)
RBC: 5.42 x10E6/uL (ref 4.14–5.80)
RDW: 13 % (ref 11.6–15.4)
WBC: 11.1 10*3/uL — ABNORMAL HIGH (ref 3.4–10.8)

## 2021-04-25 MED FILL — Pantoprazole Sodium EC Tab 20 MG (Base Equiv): ORAL | 30 days supply | Qty: 30 | Fill #0 | Status: AC

## 2021-04-26 ENCOUNTER — Encounter (HOSPITAL_COMMUNITY): Payer: Self-pay

## 2021-04-26 ENCOUNTER — Other Ambulatory Visit: Payer: Self-pay

## 2021-04-26 ENCOUNTER — Encounter (HOSPITAL_COMMUNITY)
Admission: RE | Admit: 2021-04-26 | Discharge: 2021-04-26 | Disposition: A | Discharge status: Home / Self Care | Payer: Medicaid Other | Source: Ambulatory Visit | Attending: Urology | Admitting: Urology

## 2021-04-26 ENCOUNTER — Ambulatory Visit (HOSPITAL_COMMUNITY)
Admission: RE | Admit: 2021-04-26 | Discharge: 2021-04-26 | Disposition: A | Discharge status: Home / Self Care | Payer: Medicaid Other | Source: Ambulatory Visit | Attending: Urology | Admitting: Urology

## 2021-04-26 DIAGNOSIS — C61 Malignant neoplasm of prostate: Secondary | ICD-10-CM | POA: Diagnosis not present

## 2021-04-26 LAB — POCT I-STAT CREATININE: Creatinine, Ser: 1 mg/dL (ref 0.61–1.24)

## 2021-04-26 MED ORDER — IOHEXOL 9 MG/ML PO SOLN
500.0000 mL | ORAL | Status: AC
  Administered 2021-04-26 (×2): 500 mL via ORAL

## 2021-04-26 MED ORDER — IOHEXOL 9 MG/ML PO SOLN: 1000.0000 mL | ORAL | Status: AC

## 2021-04-26 MED ORDER — IOHEXOL 9 MG/ML PO SOLN
ORAL | Status: AC
  Filled 2021-04-26: qty 1000

## 2021-04-26 MED ORDER — IOHEXOL 300 MG/ML  SOLN
100.0000 mL | Freq: Once | INTRAMUSCULAR | Status: AC | PRN
  Administered 2021-04-26: 100 mL via INTRAVENOUS

## 2021-04-26 MED ORDER — TECHNETIUM TC 99M MEDRONATE IV KIT
20.2000 | PACK | Freq: Once | INTRAVENOUS | Status: AC | PRN
  Administered 2021-04-26: 20.2 via INTRAVENOUS

## 2021-05-21 ENCOUNTER — Other Ambulatory Visit: Payer: Self-pay

## 2021-05-21 MED FILL — Losartan Potassium & Hydrochlorothiazide Tab 50-12.5 MG: ORAL | 30 days supply | Qty: 30 | Fill #1 | Status: AC

## 2021-05-22 ENCOUNTER — Other Ambulatory Visit: Payer: Self-pay

## 2021-05-22 MED FILL — Pantoprazole Sodium EC Tab 20 MG (Base Equiv): ORAL | 30 days supply | Qty: 30 | Fill #1 | Status: AC

## 2021-05-23 ENCOUNTER — Other Ambulatory Visit: Payer: Self-pay

## 2021-06-10 ENCOUNTER — Emergency Department (HOSPITAL_COMMUNITY): Payer: Medicaid Other

## 2021-06-10 ENCOUNTER — Other Ambulatory Visit: Payer: Self-pay

## 2021-06-10 ENCOUNTER — Emergency Department (HOSPITAL_COMMUNITY)
Admission: EM | Admit: 2021-06-10 | Discharge: 2021-06-11 | Disposition: A | Discharge status: Home / Self Care | Payer: Medicaid Other | Attending: Emergency Medicine | Admitting: Emergency Medicine

## 2021-06-10 DIAGNOSIS — C61 Malignant neoplasm of prostate: Secondary | ICD-10-CM | POA: Insufficient documentation

## 2021-06-10 DIAGNOSIS — Z7982 Long term (current) use of aspirin: Secondary | ICD-10-CM | POA: Insufficient documentation

## 2021-06-10 DIAGNOSIS — Z96651 Presence of right artificial knee joint: Secondary | ICD-10-CM | POA: Insufficient documentation

## 2021-06-10 DIAGNOSIS — Z87891 Personal history of nicotine dependence: Secondary | ICD-10-CM | POA: Insufficient documentation

## 2021-06-10 DIAGNOSIS — Z20822 Contact with and (suspected) exposure to covid-19: Secondary | ICD-10-CM | POA: Insufficient documentation

## 2021-06-10 DIAGNOSIS — I10 Essential (primary) hypertension: Secondary | ICD-10-CM | POA: Insufficient documentation

## 2021-06-10 DIAGNOSIS — M545 Low back pain, unspecified: Secondary | ICD-10-CM | POA: Diagnosis present

## 2021-06-10 DIAGNOSIS — Z79899 Other long term (current) drug therapy: Secondary | ICD-10-CM | POA: Diagnosis not present

## 2021-06-10 DIAGNOSIS — N131 Hydronephrosis with ureteral stricture, not elsewhere classified: Secondary | ICD-10-CM | POA: Insufficient documentation

## 2021-06-10 LAB — CBC WITH DIFFERENTIAL/PLATELET
Abs Immature Granulocytes: 0.14 10*3/uL — ABNORMAL HIGH (ref 0.00–0.07)
Basophils Absolute: 0.1 10*3/uL (ref 0.0–0.1)
Basophils Relative: 1 %
Eosinophils Absolute: 0.1 10*3/uL (ref 0.0–0.5)
Eosinophils Relative: 1 %
HCT: 44.6 % (ref 39.0–52.0)
Hemoglobin: 14.5 g/dL (ref 13.0–17.0)
Immature Granulocytes: 1 %
Lymphocytes Relative: 16 %
Lymphs Abs: 2 10*3/uL (ref 0.7–4.0)
MCH: 27.9 pg (ref 26.0–34.0)
MCHC: 32.5 g/dL (ref 30.0–36.0)
MCV: 85.9 fL (ref 80.0–100.0)
Monocytes Absolute: 1.2 10*3/uL — ABNORMAL HIGH (ref 0.1–1.0)
Monocytes Relative: 9 %
Neutro Abs: 9.3 10*3/uL — ABNORMAL HIGH (ref 1.7–7.7)
Neutrophils Relative %: 72 %
Platelets: 359 10*3/uL (ref 150–400)
RBC: 5.19 MIL/uL (ref 4.22–5.81)
RDW: 13.3 % (ref 11.5–15.5)
WBC: 12.9 10*3/uL — ABNORMAL HIGH (ref 4.0–10.5)
nRBC: 0 % (ref 0.0–0.2)

## 2021-06-10 LAB — COMPREHENSIVE METABOLIC PANEL
ALT: 27 U/L (ref 0–44)
AST: 44 U/L — ABNORMAL HIGH (ref 15–41)
Albumin: 3.3 g/dL — ABNORMAL LOW (ref 3.5–5.0)
Alkaline Phosphatase: 60 U/L (ref 38–126)
Anion gap: 8 (ref 5–15)
BUN: 18 mg/dL (ref 6–20)
CO2: 26 mmol/L (ref 22–32)
Calcium: 9.2 mg/dL (ref 8.9–10.3)
Chloride: 97 mmol/L — ABNORMAL LOW (ref 98–111)
Creatinine, Ser: 1.62 mg/dL — ABNORMAL HIGH (ref 0.61–1.24)
GFR, Estimated: 50 mL/min — ABNORMAL LOW (ref >60)
Glucose, Bld: 173 mg/dL — ABNORMAL HIGH (ref 70–99)
Potassium: 5.1 mmol/L (ref 3.5–5.1)
Sodium: 131 mmol/L — ABNORMAL LOW (ref 135–145)
Total Bilirubin: 1.6 mg/dL — ABNORMAL HIGH (ref 0.3–1.2)
Total Protein: 7.3 g/dL (ref 6.5–8.1)

## 2021-06-10 LAB — URINALYSIS, ROUTINE W REFLEX MICROSCOPIC
Bacteria, UA: NONE SEEN
Bilirubin Urine: NEGATIVE
Glucose, UA: NEGATIVE mg/dL
Ketones, ur: NEGATIVE mg/dL
Leukocytes,Ua: NEGATIVE
Nitrite: NEGATIVE
Protein, ur: NEGATIVE mg/dL
Specific Gravity, Urine: >1.046 — ABNORMAL HIGH (ref 1.005–1.030)
pH: 5 (ref 5.0–8.0)

## 2021-06-10 LAB — RESP PANEL BY RT-PCR (FLU A&B, COVID) ARPGX2
Influenza A by PCR: NEGATIVE
Influenza B by PCR: NEGATIVE
SARS Coronavirus 2 by RT PCR: NEGATIVE

## 2021-06-10 LAB — TROPONIN I (HIGH SENSITIVITY)
Troponin I (High Sensitivity): 10 ng/L (ref <18)
Troponin I (High Sensitivity): 9 ng/L (ref <18)

## 2021-06-10 LAB — LIPASE, BLOOD: Lipase: 35 U/L (ref 11–51)

## 2021-06-10 MED ORDER — IOHEXOL 300 MG/ML  SOLN
100.0000 mL | Freq: Once | INTRAMUSCULAR | Status: AC | PRN
  Administered 2021-06-10: 100 mL via INTRAVENOUS

## 2021-06-10 NOTE — ED Provider Notes (Signed)
Emergency Medicine Provider Triage Evaluation Note  [name] , a 55 y.o. male  was evaluated in triage.  Pt complains of low back pain, right leg weakness and tingling, difficulty walking, diarrhea, and saddle anasthesia x 4 days. Recent diagnosis with metastatic prostate cancer 2 months ago, no treatment  yet due to insurance. Abdominal pain and chills, chest pains today as well.  Review of Systems  Positive: Saddle anaesthesia, right leg tingling, numbness, low back pain, CP Negative: N/V, palpitations  Physical Exam  BP (!) 153/82 (BP Location: Left Arm)   Pulse (!) 107   Temp 99.6 F (37.6 C) (Oral)   Resp 18   SpO2 95%  Gen:   Awake, no distress   Resp:  Normal effort  MSK:   Moves extremities without difficulty  Other:  TTP of the L-spine midline, symmetric strength in plantar and dorsiflexion bilaterally.  Medical Decision Making  Medically screening exam initiated at 12:52 PM.  Appropriate orders placed.  [name] was informed that the remainder of the evaluation will be completed by another provider, this initial triage assessment does not replace that evaluation, and the importance of remaining in the ED until their evaluation is complete.  This chart was dictated using voice recognition software, Dragon. Despite the best efforts of this provider to proofread and correct errors, errors may still occur which can change documentation meaning.    Emeline Darling, PA-C 06/10/21 1306    Daleen Bo, MD 06/10/21 1816

## 2021-06-10 NOTE — ED Triage Notes (Signed)
Pt reports four days of lower back pain, R groin pain, and abdominal pain with diarrhea. Denies injury.Taking 800 mg ibuprofen with temporary relief. Has prostate cancer but is not currently doing tx d/t insurance problems.

## 2021-06-10 NOTE — Addendum Note (Signed)
Encounter addended by: Annie Paras on: 06/10/2021 2:30 PM  Actions taken: Letter saved

## 2021-06-11 LAB — I-STAT CHEM 8, ED
BUN: 19 mg/dL (ref 6–20)
Calcium, Ion: 1.21 mmol/L (ref 1.15–1.40)
Chloride: 99 mmol/L (ref 98–111)
Creatinine, Ser: 1.5 mg/dL — ABNORMAL HIGH (ref 0.61–1.24)
Glucose, Bld: 118 mg/dL — ABNORMAL HIGH (ref 70–99)
HCT: 45 % (ref 39.0–52.0)
Hemoglobin: 15.3 g/dL (ref 13.0–17.0)
Potassium: 4.3 mmol/L (ref 3.5–5.1)
Sodium: 135 mmol/L (ref 135–145)
TCO2: 27 mmol/L (ref 22–32)

## 2021-06-11 MED ORDER — FENTANYL CITRATE (PF) 100 MCG/2ML IJ SOLN
100.0000 ug | Freq: Once | INTRAMUSCULAR | Status: AC
  Administered 2021-06-11: 100 ug via INTRAVENOUS
  Filled 2021-06-11: qty 2

## 2021-06-11 MED ORDER — HYDROCODONE-ACETAMINOPHEN 5-325 MG PO TABS: 1.0000 | ORAL_TABLET | Freq: Four times a day (QID) | ORAL | 0 refills | Status: DC | PRN

## 2021-06-11 NOTE — ED Provider Notes (Signed)
[facility] EMERGENCY DEPARTMENT Provider Note   CSN: [id number] Arrival date & time: 06/10/21  1241     History Chief Complaint  Patient presents with   Back Pain    [name] is a 55 y.o. male.  The history is provided by the patient.  Back Pain Location:  Lumbar spine Quality:  Aching Pain severity:  Moderate Onset quality:  Gradual Duration:  4 days Timing:  Constant Progression:  Worsening Chronicity:  New Relieved by:  Nothing Exacerbated by: movement. Associated symptoms: abdominal pain   Associated symptoms: no bladder incontinence, no bowel incontinence, no dysuria, no fever and no weakness   Risk factors: hx of cancer   Patient history of hypertension, prostate cancer presents with back pain.  Patient reports for the past 4 days he has had lower mid back pain and back pain into his right flank.  He also reports some associated abdominal pain.  No focal weakness, no incontinence.  He does report it hurts to walk.  No previous back surgery. Patient reports he has been unable to get his prostate cancer treated due to insurance issues    Past Medical History:  Diagnosis Date   Arthritis of knee, right    GERD (gastroesophageal reflux disease)    Hypertension    prostate ca dx'd 03/2021    Patient Active Problem List   Diagnosis Date Noted   Erectile dysfunction 02/27/2020   Back pain without sciatica 02/27/2020   Class 3 severe obesity due to excess calories with serious comorbidity and body mass index (BMI) of 40.0 to 44.9 in adult [facility]) 02/27/2020   Status post total knee replacement, right 10/15/2018   Arthritis of right knee 07/16/2018   Prediabetes 01/02/2017   Essential hypertension 09/04/2016    Past Surgical History:  Procedure Laterality Date   CHOLECYSTECTOMY     COLONOSCOPY  02/04/2006   Dr.Jacobs   TOTAL KNEE ARTHROPLASTY Right 07/16/2018   TOTAL KNEE ARTHROPLASTY Right 07/16/2018   Procedure: RIGHT TOTAL KNEE  REPLACEMENT;  Surgeon: Marybelle Killings, MD;  Location: [facility];  Service: Orthopedics;  Laterality: Right;       Family History  Problem Relation Age of Onset   Cancer Father        prostate    Colon cancer Father 22   Cancer Maternal Grandfather        prostate   Colon cancer Maternal Uncle    Colon cancer Paternal Grandfather    Esophageal cancer Neg Hx    Rectal cancer Neg Hx    Stomach cancer Neg Hx     Social History   Tobacco Use   Smoking status: Former    Pack years: 0.00    Types: Cigarettes    Quit date: 11/20/2015    Years since quitting: 5.5   Smokeless tobacco: Never  Vaping Use   Vaping Use: Never used  Substance Use Topics   Alcohol use: No   Drug use: No    Home Medications Prior to Admission medications   Medication Sig Start Date End Date Taking? Authorizing Provider  acetaminophen (TYLENOL) 500 MG tablet Take 500 mg by mouth every 6 (six) hours as needed.    [provider]  aspirin EC 325 MG EC tablet Take 1 tablet (325 mg total) by mouth daily with breakfast. 07/18/18   Aundra Dubin, PA-C  losartan-hydrochlorothiazide (HYZAAR) 50-12.5 MG tablet TAKE 1 TABLET BY MOUTH DAILY. 08/29/20 08/29/21  Vevelyn Francois, NP  pantoprazole (PROTONIX) 20 MG tablet TAKE 1 TABLET (20 MG TOTAL) BY MOUTH DAILY. 08/29/20 08/29/21  Vevelyn Francois, NP  sildenafil (VIAGRA) 100 MG tablet Take 1 tablet (100 mg total) by mouth daily as needed for erectile dysfunction. 02/27/20   Azzie Glatter, FNP    Allergies    Patient has no known allergies.  Review of Systems   Review of Systems  Constitutional:  Negative for fever.  Gastrointestinal:  Positive for abdominal pain. Negative for bowel incontinence.  Genitourinary:  Negative for bladder incontinence and dysuria.  Musculoskeletal:  Positive for back pain.  Neurological:  Negative for weakness.  All other systems reviewed and are negative.  Physical Exam Updated Vital Signs BP (!) 144/85 (BP Location: Right  Arm)   Pulse 91   Temp 99.1 F (37.3 C) (Oral)   Resp 18   SpO2 93%   Physical Exam CONSTITUTIONAL: Well developed/well nourished HEAD: Normocephalic/atraumatic EYES: EOMI/PERRL ENMT: Mucous membranes moist NECK: supple no meningeal signs SPINE/BACK: Lumbar tenderness to palpation, no thoracic tenderness, no bruising/crepitance/stepoffs noted to spine CV: S1/S2 noted, no murmurs/rubs/gallops noted LUNGS: Lungs are clear to auscultation bilaterally, no apparent distress ABDOMEN: soft, nontender, no rebound or guarding IR:JJOAC  cva tenderness NEURO: Awake/alert, equal motor 5/5 strength noted with the following: hip flexion/knee flexion/extension, foot dorsi/plantar flexion, great toe extension intact bilaterally, no clonus bilaterally, plantar reflex appropriate (toes downgoing), no sensory deficit in any dermatome.  EXTREMITIES: pulses normalx4, full ROM SKIN: warm, color normal PSYCH: no abnormalities of mood noted, alert and oriented to situation  ED Results / Procedures / Treatments   Labs (all labs ordered are listed, but only abnormal results are displayed) Labs Reviewed  URINALYSIS, ROUTINE W REFLEX MICROSCOPIC - Abnormal; Notable for the following components:      Result Value   Specific Gravity, Urine >1.046 (*)    Hgb urine dipstick SMALL (*)    All other components within normal limits  COMPREHENSIVE METABOLIC PANEL - Abnormal; Notable for the following components:   Sodium 131 (*)    Chloride 97 (*)    Glucose, Bld 173 (*)    Creatinine, Ser 1.62 (*)    Albumin 3.3 (*)    AST 44 (*)    Total Bilirubin 1.6 (*)    GFR, Estimated 50 (*)    All other components within normal limits  CBC WITH DIFFERENTIAL/PLATELET - Abnormal; Notable for the following components:   WBC 12.9 (*)    Neutro Abs 9.3 (*)    Monocytes Absolute 1.2 (*)    Abs Immature Granulocytes 0.14 (*)    All other components within normal limits  RESP PANEL BY RT-PCR (FLU A&B, COVID) [id number]   URINE CULTURE  LIPASE, BLOOD  TROPONIN I (HIGH SENSITIVITY)  TROPONIN I (HIGH SENSITIVITY)    EKG EKG Interpretation  Date/Time:  Monday June 10 2021 13:08:33 EDT Ventricular Rate:  103 PR Interval:  160 QRS Duration: 72 QT Interval:  330 QTC Calculation: 432 R Axis:   80 Text Interpretation: Sinus tachycardia Otherwise normal ECG Since last tracing rate faster Otherwise no significant change Confirmed by Daleen Bo [phone]) on 06/10/2021 6:16:24 PM  Radiology DG Chest 2 View  Result Date: 06/10/2021 CLINICAL DATA:  Shortness of breath EXAM: CHEST - 2 VIEW COMPARISON:  07/06/2018 FINDINGS: Cardiac shadow is at the upper limits of normal in size. The lungs are well aerated bilaterally. No focal infiltrate or effusion is seen. No bony is noted. IMPRESSION: No acute abnormality noted. Electronically  Signed   By: Inez Catalina M.D.   On: 06/10/2021 13:28   MR LUMBAR SPINE WO CONTRAST  Result Date: 06/10/2021 CLINICAL DATA:  Low back pain, right leg weakness and tingling, difficulty walking, and saddle anesthesia for 4 days. History of metastatic prostate cancer. EXAM: MRI LUMBAR SPINE WITHOUT CONTRAST TECHNIQUE: Multiplanar, multisequence MR imaging of the lumbar spine was performed. No intravenous contrast was administered. COMPARISON:  CT abdomen and pelvis 06/10/2021. Nuclear medicine whole-body bone scan 04/26/2021. FINDINGS: Segmentation:  Standard. Alignment:  Normal. Vertebrae: No fracture, suspicious marrow lesion, or significant marrow edema. No evidence of epidural tumor. Small hemangioma in the S3 segment. Conus medullaris and cauda equina: Conus extends to the L1 level. Conus and cauda equina appear normal. Paraspinal and other soft tissues: Retroperitoneal lymphadenopathy as described on today's CT of the abdomen and pelvis. Disc levels: Disc desiccation from L2-3 to L5-S1. Minimal disc space narrowing at L4-5 and L5-S1. L1-2: Negative. L2-3: Mild disc bulging without stenosis.  L3-4: Disc bulging and mild facet and ligamentum flavum hypertrophy result in mild right and mild-to-moderate left neural foraminal stenosis without spinal stenosis. L4-5: Disc bulging and moderate facet hypertrophy result in mild bilateral lateral recess stenosis and mild-to-moderate bilateral neural foraminal stenosis. Prominent dorsal epidural fat contributes to borderline spinal stenosis. L5-S1: Disc bulging and mild facet hypertrophy result in mild-to-moderate left greater than right neural foraminal stenosis without spinal stenosis. IMPRESSION: 1. No evidence of metastatic disease in the lumbar spine. 2. Lumbar disc and facet degeneration with mild-to-moderate neural foraminal stenosis at L3-4, L4-5, and L5-S1. 3. Mild lateral recess stenosis at L4-5. 4. Bulky retroperitoneal lymphadenopathy, more fully evaluated on today's earlier CT. Electronically Signed   By: Logan Bores M.D.   On: 06/10/2021 19:33   CT Abdomen Pelvis W Contrast  Result Date: 06/10/2021 CLINICAL DATA:  Four day history of lower back pain, right groin pain, abdominal pain, and diarrhea. History of prostate cancer. EXAM: CT ABDOMEN AND PELVIS WITH CONTRAST TECHNIQUE: Multidetector CT imaging of the abdomen and pelvis was performed using the standard protocol following bolus administration of intravenous contrast. CONTRAST:  17mL OMNIPAQUE IOHEXOL 300 MG/ML  SOLN COMPARISON:  04/26/2021 FINDINGS: Lower chest: Linear atelectasis in the lung bases. Cardiac enlargement. Hepatobiliary: The focal liver lesion demonstrated previously in the right lobe is poorly demarcated today but appears to be present. This could be better evaluated at MRI if clinically indicated. Status post cholecystectomy. No biliary dilatation. Pancreas: Unremarkable. No pancreatic ductal dilatation or surrounding inflammatory changes. Spleen: Calcified granulomas in the spleen. Adrenals/Urinary Tract: No adrenal gland nodules. Delayed right renal nephrogram with right  hydronephrosis and hydroureter consistent with obstruction. Stranding around the right ureter. No definite ureteral stone identified and cause of obstruction appears to be lymphadenopathy in the pericaval region. An occult stone is not excluded. Bladder is decompressed. Stomach/Bowel: Stomach, small bowel, and colon are not abnormally distended. No wall thickening or inflammatory changes. Appendix is normal. Vascular/Lymphatic: Prominent right retroperitoneal pericaval lymphadenopathy, most prominent just above the bifurcation. Largest node measures 3.6 x 5.4 cm in transverse dimensions. Difficult to follow the IVC through the lymphadenopathy, possibly indicating extrinsic compression of the IVC. Lymphadenopathy extends into the internal and external iliac chains bilaterally in the pelvis. Focal lymphadenopathy also demonstrated in the perirectal fat. This likely indicates metastasis from the patient's known prostate cancer. Reproductive: Prostate gland is enlarged, measuring 5 cm diameter. Asymmetric nodular prominence posteriorly to the left with enlarged left seminal vesicle. Infiltration in the surrounding periprostatic fat  may indicate local extension. Other: No free air or free fluid in the abdomen. Abdominal wall musculature appears intact. Intramuscular lipoma in the right gluteus medius muscle. Fat in the left inguinal canal. Musculoskeletal: Degenerative changes in the spine. No focal bone lesions identified. IMPRESSION: 1. Extensive retroperitoneal and bilateral pelvic lymphadenopathy, most prominent in the pericaval region just above the bifurcation. Lymphadenopathy also demonstrated in the deep pelvic fat. This likely represents metastatic prostate cancer. Lymphadenopathy is increased since the previous study. 2. Interval development of right ureteral obstruction, likely due to extrinsic compression from the lymphadenopathy. 3. Poorly defined mass in the right lobe of the liver as seen on prior study.  This would be better defined and MRI if clinically indicated. 4. Calcified granulomas in the spleen. Electronically Signed   By: Lucienne Capers M.D.   On: 06/10/2021 19:15    Procedures Procedures   Medications Ordered in ED Medications  iohexol (OMNIPAQUE) 300 MG/ML solution 100 mL (100 mLs Intravenous Contrast Given 06/10/21 1849)    ED Course  I have reviewed the triage vital signs and the nursing notes.  Pertinent labs & imaging results that were available during my care of the patient were reviewed by me and considered in my medical decision making (see chart for details).    MDM Rules/Calculators/A&P                          5:21 AM Patient presented with low back pain for several days.  Patient currently has a history of untreated prostate cancer.  He reports difficulty finding treatment due to Medicaid.  Imaging had already been obtained while he was waiting in the emergency department.  No obvious signs of metastatic disease to his lumbar spine.  However on CT imaging, he does have evidence of ureteral obstruction by lymphadenopathy.  Discussed this with Dr. Lovena Neighbours with urology.  He recommends if creatinine is not significantly worsened and his pain is controlled, this can be seen as a close follow-up 6:57 AM Patient improved.  No significant worsening his creatinine since arrival.  No signs of UTI and is not septic appearing.  Patient has been ambulatory.  No indication for admission at this time At this point patient is safe for discharge home.  However he has had difficulty following up with urology due to [facility].  He is already making attempts to follow-up with [facility].  I advised him to keep calling [facility], and we will also refer back to [facility] and to insist that he is an emergency department referral.  I stressed the importance of close follow-up this week for this hydronephrosis.  I advised him if no improvement in next 48 hours he should return to the ER  Final  Clinical Impression(s) / ED Diagnoses Final diagnoses:  Hydronephrosis with ureteral stricture, not elsewhere classified  Prostate cancer [facility])    Rx / DC Orders ED Discharge Orders          Ordered    HYDROcodone-acetaminophen (NORCO/VICODIN) 5-325 MG tablet  Every 6 hours PRN        06/11/21 0071             Ripley Fraise, MD 06/11/21 [phone]

## 2021-06-12 LAB — URINE CULTURE

## 2021-06-14 ENCOUNTER — Encounter (HOSPITAL_BASED_OUTPATIENT_CLINIC_OR_DEPARTMENT_OTHER): Payer: Self-pay | Admitting: Urology

## 2021-06-14 ENCOUNTER — Other Ambulatory Visit: Payer: Self-pay | Admitting: Urology

## 2021-06-14 ENCOUNTER — Other Ambulatory Visit: Payer: Self-pay

## 2021-06-14 NOTE — Progress Notes (Signed)
Spoke w/ via phone for pre-op interview--- Pt Lab needs dos----  Istat             Lab results------ current ekg in epic/ chart COVID test -----patient states asymptomatic no test needed Arrive at ------- 0915 on 06-18-2021 NPO after MN NO Solid Food.  Clear liquids from MN until--- 0815 Med rec completed Medications to take morning of surgery ----- NONE Diabetic medication ----- Patient instructed no nail polish to be worn day of surgery Patient instructed to bring photo id and insurance card day of surgery Patient aware to have Driver (ride ) / caregiver for 24 hours after surgery -- wife, Estill Bamberg Patient Special Instructions ----- n/a Pre-Op special Istructions ----- n/a Patient verbalized understanding of instructions that were given at this phone interview. Patient denies shortness of breath, chest pain, fever, cough at this phone interview.

## 2021-06-14 NOTE — Progress Notes (Signed)
   06/14/21 1610  OBSTRUCTIVE SLEEP APNEA  Have you ever been diagnosed with sleep apnea through a sleep study? No  Do you snore loudly (loud enough to be heard through closed doors)?  1  Do you often feel tired, fatigued, or sleepy during the daytime (such as falling asleep during driving or talking to someone)? 0  Has anyone observed you stop breathing during your sleep? 0  Do you have, or are you being treated for high blood pressure? 1  BMI more than 35 kg/m2? 1  Age > 35 (1-yes) 1  Male Gender (Yes=1) 1  Obstructive Sleep Apnea Score 5  Score 5 or greater  Results sent to PCP

## 2021-06-17 NOTE — H&P (Signed)
Patient is a 55 year old African American male seen today for evaluation of possible elevated PSA. Patient states that he had recent blood work done and annual physical was told that his prostate blood work was abnormal. I have no copy of the results to review in the chart. His urologic history is significant for recent UTI treated with antibiotics approximately 2 weeks ago at the [facility] emergency room. He has been asymptomatic since being treated with antibiotics. He was having significant dysuria urgency and frequency at that time. His PSA blood test was apparently drawn a couple of weeks prior to his emergency room trips. Family history is significant for paternal uncle who was diagnosed with prostate cancer in his 77s. Unclear whether he had treatment for this. Currently again minimal if any voiding symptoms.  -09/10/20-patient with history of markedly elevated PSA of 44.3 back on 06/04/2020. This however was drawn during active urinary tract infection. He is currently asymptomatic. Micro urinalysis today is clear here for repeat PSA.  -05/09/21-patient with markedly elevated PSA of 44.3 as above. Underwent transrectal ultrasound prostate biopsy on 04/27/2021 with all 12 biopsy showing Gleason 4+5=9. Has subsequently undergone staging studies with CT and bone scan on 05/24/21. This is reviewed today shows some bulky retroperitoneal adenopathy and also a 6 cm liver lesion in the right lobe of the liver and cannot rule out a metastatic lesion MRI was recommended. No evidence of obvious bony metastasis noted.   LINICAL DATA: Prostate cancer.   EXAM:  CT ABDOMEN AND PELVIS WITH CONTRAST   TECHNIQUE:  Multidetector CT imaging of the abdomen and pelvis was performed  using the standard protocol following bolus administration of  intravenous contrast.   CONTRAST: 178mL OMNIPAQUE IOHEXOL 300 MG/ML SOLN   COMPARISON: CT stone study 06/10/2020.   FINDINGS:  Lower chest: Unremarkable.   Hepatobiliary:  6.1 x 5.3 cm lesion in the right liver (21/2) has  some scattered punctate calcification. Possible focal fatty sparing,  but mass lesion not excluded. Gallbladder surgically absent. No  intrahepatic or extrahepatic biliary dilation.   Pancreas: No focal mass lesion. No dilatation of the main duct. No  intraparenchymal cyst. No peripancreatic edema.   Spleen: Some fide granulomata in the spleen.   Adrenals/Urinary Tract: No adrenal nodule or mass. 1.7 cm exophytic  lesion upper pole right kidney compatible with a cyst. Left kidney  unremarkable No evidence for hydroureter. Bladder is nondistended.   Stomach/Bowel: Stomach is unremarkable. No gastric wall thickening.  No evidence of outlet obstruction. Duodenum is normally positioned  as is the ligament of Treitz. No small bowel wall thickening. No  small bowel dilatation. The terminal ileum is normal. The appendix  is normal. No gross colonic mass. No colonic wall thickening.   Vascular/Lymphatic: No abdominal aortic aneurysm. Ill-defined bulky  retroperitoneal lymphadenopathy identified including 2.1 cm short  axis right common iliac node on 54/2. 1.3 cm short axis retrocaval  node visible on 41/2. 2.4 cm short axis right external iliac node on  73/2 and 1.7 cm short axis left external iliac node visible on 76/2.   Reproductive: Prostate gland is enlarged.   Other: No intraperitoneal free fluid.   Musculoskeletal: Small left groin hernia contains only fat. No  worrisome lytic or sclerotic osseous abnormality. Intramuscular  lipoma noted right gluteus medius.   IMPRESSION:  1. Ill-defined bulky retroperitoneal and bilateral external iliac  lymphadenopathy, consistent with metastatic disease.  2. 6.1 x 5.3 cm lesion in the right liver has some scattered  punctate calcification.  Possible focal fatty sparing, but metastatic  lesion not excluded. MRI of the abdomen without and with contrast  recommended to further evaluate.  3.  Prostatomegaly.  4. Small left groin hernia contains only fat.    Electronically Signed  By: Misty Stanley M.D.  On: 04/29/2021 14:36   CLINICAL DATA: Prostate cancer. No pain reported. No recent  surgeries or trauma.   EXAM:  NUCLEAR MEDICINE WHOLE BODY BONE SCAN   TECHNIQUE:  Whole body anterior and posterior images were obtained approximately  3 hours after intravenous injection of radiopharmaceutical.   RADIOPHARMACEUTICALS: 20.8 mCi Technetium-42m MDP IV   COMPARISON: CT of the abdomen and pelvis of April 26, 2021   FINDINGS:  Symmetric uptake and excretion from the bilateral kidneys.  Periarticular uptake about the RIGHT knee in the setting of prior  arthroplasty.   Some areas of activity overlying the symphysis pubis and pelvis  favored to represent urinary contamination and urinary bladder.   Scattered areas of uptake along posterior elements of the RIGHT  spine in the thoracic spine favored to represent degenerative  changes.   Calvarial activity is symmetric in bilateral potentially  hyperostosis.   IMPRESSION:  No definite evidence of metastatic disease to the skeleton with  areas of uptake with potential explanations as discussed. Visible  lower thoracic spine on previous CT did show signs of significant  RIGHT costovertebral degenerative change at the T12 level and T11  level.   Activity about the bilateral knees related to degenerative changes  and prior knee arthroplasty.    Electronically Signed  By: Zetta Bills M.D.  On: 04/29/2021 14:44  -06/14/21-patient with history of metastatic adenocarcinoma the prostate as above. Unfortunately had to go to the emergency room for malaise and back pain on 06/10/2021 and on recent CT scan now has hydronephrosis on the right likely secondary to obstruction from lymphadenopathy in the retroperitoneum. See CT report below. Patient now here for follow-up. Patient's creatinine was 1.6 with a GFR of 50 at the time of  his emergency room visit. The patient has continued to have significant pain requiring narcotic pain use. No nausea vomiting or fever.  CLINICAL DATA: Four day history of lower back pain, right groin  pain, abdominal pain, and diarrhea. History of prostate cancer.     EXAM:  CT ABDOMEN AND PELVIS WITH CONTRAST     TECHNIQUE:  Multidetector CT imaging of the abdomen and pelvis was performed  using the standard protocol following bolus administration of  intravenous contrast.     CONTRAST: 189mL OMNIPAQUE IOHEXOL 300 MG/ML SOLN     COMPARISON: 04/26/2021     FINDINGS:  Lower chest: Linear atelectasis in the lung bases. Cardiac  enlargement.     Hepatobiliary: The focal liver lesion demonstrated previously in the  right lobe is poorly demarcated today but appears to be present.  This could be better evaluated at MRI if clinically indicated.  Status post cholecystectomy. No biliary dilatation.     Pancreas: Unremarkable. No pancreatic ductal dilatation or  surrounding inflammatory changes.     Spleen: Calcified granulomas in the spleen.     Adrenals/Urinary Tract: No adrenal gland nodules. Delayed right  renal nephrogram with right hydronephrosis and hydroureter  consistent with obstruction. Stranding around the right ureter. No  definite ureteral stone identified and cause of obstruction appears  to be lymphadenopathy in the pericaval region. An occult stone is  not excluded. Bladder is decompressed.     Stomach/Bowel: Stomach, small bowel, and colon  are not abnormally  distended. No wall thickening or inflammatory changes. Appendix is  normal.     Vascular/Lymphatic: Prominent right retroperitoneal pericaval  lymphadenopathy, most prominent just above the bifurcation. Largest  node measures 3.6 x 5.4 cm in transverse dimensions. Difficult to  follow the IVC through the lymphadenopathy, possibly indicating  extrinsic compression of the IVC. Lymphadenopathy extends into the   internal and external iliac chains bilaterally in the pelvis. Focal  lymphadenopathy also demonstrated in the perirectal fat. This likely  indicates metastasis from the patient's known prostate cancer.     Reproductive: Prostate gland is enlarged, measuring 5 cm diameter.  Asymmetric nodular prominence posteriorly to the left with enlarged  left seminal vesicle. Infiltration in the surrounding periprostatic  fat may indicate local extension.     Other: No free air or free fluid in the abdomen. Abdominal wall  musculature appears intact. Intramuscular lipoma in the right  gluteus medius muscle. Fat in the left inguinal canal.     Musculoskeletal: Degenerative changes in the spine. No focal bone  lesions identified.     IMPRESSION:  1. Extensive retroperitoneal and bilateral pelvic lymphadenopathy,  most prominent in the pericaval region just above the bifurcation.  Lymphadenopathy also demonstrated in the deep pelvic fat. This  likely represents metastatic prostate cancer. Lymphadenopathy is  increased since the previous study.  2. Interval development of right ureteral obstruction, likely due to  extrinsic compression from the lymphadenopathy.  3. Poorly defined mass in the right lobe of the liver as seen on  prior study. This would be better defined and MRI if clinically  indicated.  4. Calcified granulomas in the spleen.        Electronically Signed  By: Lucienne Capers M.D.  On: 06/10/2021 19:15     ALLERGIES: None   MEDICATIONS: Bayer Advanced  Levofloxacin 750 mg tablet Take 1 tab po 1 hour prior to procedure  Losartan Potassium     GU PSH: Prostate Needle Biopsy - 03/27/2021     NON-GU PSH: Cholecystectomy (laparoscopic) Knee replacement Surgical Pathology, Gross And Microscopic Examination For Prostate Needle - 03/27/2021     GU PMH: Prostate Cancer - 05/09/2021 Elevated PSA - 03/27/2021, - 09/10/2020, - 07/26/2020 Family Hx of Prostate Cancer -  09/10/2020 Personal Hx Urinary Tract Infections - 07/26/2020    NON-GU PMH: Metastatic lymphadenopathy of multiple regions - 05/09/2021 Hypertension    FAMILY HISTORY: Cancer - Father Prostate Cancer - Uncle   SOCIAL HISTORY: Marital Status: Married Preferred Language: English; Ethnicity: Not Hispanic Or Latino; Race: Black or African American Current Smoking Status: Patient does not smoke anymore. Has not smoked since 06/29/2015. Smoked for 15 years.   Tobacco Use Assessment Completed: Used Tobacco in last 30 days? Does not drink anymore.  Drinks 1 caffeinated drink per day.    REVIEW OF SYSTEMS:    GU Review Male:   Patient denies frequent urination, hard to postpone urination, burning/ pain with urination, get up at night to urinate, leakage of urine, stream starts and stops, trouble starting your stream, have to strain to urinate , erection problems, and penile pain.  Gastrointestinal (Upper):   Patient denies nausea, vomiting, and indigestion/ heartburn.  Gastrointestinal (Lower):   Patient denies diarrhea and constipation.  Constitutional:   Patient denies fever, night sweats, weight loss, and fatigue.  Skin:   Patient denies skin rash/ lesion and itching.  Eyes:   Patient denies blurred vision and double vision.  Ears/ Nose/ Throat:   Patient denies  sore throat and sinus problems.  Hematologic/Lymphatic:   Patient denies swollen glands and easy bruising.  Cardiovascular:   Patient denies leg swelling and chest pains.  Respiratory:   Patient denies cough and shortness of breath.  Endocrine:   Patient denies excessive thirst.  Musculoskeletal:   Patient denies back pain and joint pain.  Neurological:   Patient denies headaches and dizziness.  Psychologic:   Patient denies depression and anxiety.   VITAL SIGNS:      06/14/2021 12:03 PM  Weight 290 lb / 131.54 kg  Height 70 in / 177.8 cm  BP 141/80 mmHg  Pulse 103 /min  Temperature 98.2 F / 36.7 C  BMI 41.6 kg/m    MULTI-SYSTEM PHYSICAL EXAMINATION:    Constitutional: Well-nourished. No physical deformities. Normally developed. Good grooming.  Neck: Neck symmetrical, not swollen. Normal tracheal position.  Respiratory: No labored breathing, no use of accessory muscles.   Cardiovascular: Normal temperature, normal extremity pulses, no swelling, no varicosities.  Lymphatic: No enlargement of neck, axillae, groin.  Skin: No paleness, no jaundice, no cyanosis. No lesion, no ulcer, no rash.  Neurologic / Psychiatric: Oriented to time, oriented to place, oriented to person. No depression, no anxiety, no agitation.  Gastrointestinal: No mass, no tenderness, no rigidity, non obese abdomen.  Eyes: Normal conjunctivae. Normal eyelids.  Ears, Nose, Mouth, and Throat: Left ear no scars, no lesions, no masses. Right ear no scars, no lesions, no masses. Nose no scars, no lesions, no masses. Normal hearing. Normal lips.  Musculoskeletal: Normal gait and station of head and neck.     Complexity of Data:   09/10/20  PSA  Total PSA 56.10 ng/mL    PROCEDURES:          Firmagon 240mg  - [id number], [id number] 120 mg on the left and 120 mg on the right side of the stomach   Qty: 240 Adm. By: Jairo Ben Dezantil  Unit: mg Lot No [id number]  Route: SQ Exp. Date   Freq: None Mfgr.:   Site: None   ASSESSMENT:      ICD-10 Details  1 GU:   Prostate Cancer - Z60 Acute, Complicated Injury  3   Hydronephrosis - F09.3 Acute, Complicated Injury  2 NON-GU:   Metastatic lymphadenopathy of multiple regions - A35.5 Acute, Complicated Injury   PLAN:            Medications Stop Meds: Cipro 500 mg tablet 1 tablet PO As Directed  Start: 10/10/2020  Discontinue: 06/14/2021  - Reason: The medication cycle was completed.            Document Letter(s):  Created for Patient: Clinical Summary         Notes:   We discussed management of his prostate cancer and a management of hydronephrosis causing flank pain. He is going to receive 240  mg of Firmagon today as initial dose for androgen deprivation therapy. We are going to set him up for cysto and insertion of right JJ stent in the near future. We also discussed possibility of nephrostomy tube but after consideration he wants to try the stent 1st. Risks and benefits of the procedure were discussed in detail today. Set up accordingly.

## 2021-06-18 ENCOUNTER — Encounter (HOSPITAL_BASED_OUTPATIENT_CLINIC_OR_DEPARTMENT_OTHER)
Admission: RE | Disposition: A | Discharge status: Home / Self Care | Payer: Self-pay | Source: Ambulatory Visit | Attending: Urology

## 2021-06-18 ENCOUNTER — Ambulatory Visit (HOSPITAL_BASED_OUTPATIENT_CLINIC_OR_DEPARTMENT_OTHER)
Admission: RE | Admit: 2021-06-18 | Discharge: 2021-06-18 | Disposition: A | Discharge status: Home / Self Care | Payer: Medicaid Other | Source: Ambulatory Visit | Attending: Urology | Admitting: Urology

## 2021-06-18 ENCOUNTER — Ambulatory Visit (HOSPITAL_BASED_OUTPATIENT_CLINIC_OR_DEPARTMENT_OTHER): Payer: Medicaid Other | Admitting: Anesthesiology

## 2021-06-18 ENCOUNTER — Encounter (HOSPITAL_BASED_OUTPATIENT_CLINIC_OR_DEPARTMENT_OTHER): Payer: Self-pay | Admitting: Urology

## 2021-06-18 ENCOUNTER — Other Ambulatory Visit: Payer: Self-pay

## 2021-06-18 DIAGNOSIS — R590 Localized enlarged lymph nodes: Secondary | ICD-10-CM | POA: Diagnosis not present

## 2021-06-18 DIAGNOSIS — C61 Malignant neoplasm of prostate: Secondary | ICD-10-CM | POA: Diagnosis present

## 2021-06-18 DIAGNOSIS — Z6841 Body Mass Index (BMI) 40.0 and over, adult: Secondary | ICD-10-CM | POA: Diagnosis not present

## 2021-06-18 DIAGNOSIS — R160 Hepatomegaly, not elsewhere classified: Secondary | ICD-10-CM | POA: Diagnosis not present

## 2021-06-18 DIAGNOSIS — N1330 Unspecified hydronephrosis: Secondary | ICD-10-CM | POA: Diagnosis not present

## 2021-06-18 HISTORY — DX: Unspecified osteoarthritis, unspecified site: M19.90

## 2021-06-18 HISTORY — DX: Presence of spectacles and contact lenses: Z97.3

## 2021-06-18 HISTORY — DX: Male erectile dysfunction, unspecified: N52.9

## 2021-06-18 HISTORY — DX: Other chronic pain: G89.29

## 2021-06-18 HISTORY — DX: Unspecified hydronephrosis: N13.30

## 2021-06-18 HISTORY — DX: Prediabetes: R73.03

## 2021-06-18 HISTORY — DX: Malignant neoplasm of prostate: C61

## 2021-06-18 HISTORY — PX: CYSTOSCOPY W/ URETERAL STENT PLACEMENT: SHX1429

## 2021-06-18 LAB — POCT I-STAT, CHEM 8
BUN: 21 mg/dL — ABNORMAL HIGH (ref 6–20)
Calcium, Ion: 1.25 mmol/L (ref 1.15–1.40)
Chloride: 99 mmol/L (ref 98–111)
Creatinine, Ser: 1.1 mg/dL (ref 0.61–1.24)
Glucose, Bld: 107 mg/dL — ABNORMAL HIGH (ref 70–99)
HCT: 46 % (ref 39.0–52.0)
Hemoglobin: 15.6 g/dL (ref 13.0–17.0)
Potassium: 4.7 mmol/L (ref 3.5–5.1)
Sodium: 138 mmol/L (ref 135–145)
TCO2: 31 mmol/L (ref 22–32)

## 2021-06-18 SURGERY — CYSTOSCOPY, WITH RETROGRADE PYELOGRAM AND URETERAL STENT INSERTION
Anesthesia: General | Site: Ureter | Laterality: Bilateral

## 2021-06-18 MED ORDER — LIDOCAINE HCL (PF) 2 % IJ SOLN
INTRAMUSCULAR | Status: AC
  Filled 2021-06-18: qty 5

## 2021-06-18 MED ORDER — ONDANSETRON HCL 4 MG/2ML IJ SOLN
INTRAMUSCULAR | Status: DC | PRN
  Administered 2021-06-18: 4 mg via INTRAVENOUS

## 2021-06-18 MED ORDER — FENTANYL CITRATE (PF) 100 MCG/2ML IJ SOLN
INTRAMUSCULAR | Status: AC
  Filled 2021-06-18: qty 2

## 2021-06-18 MED ORDER — ONDANSETRON HCL 4 MG/2ML IJ SOLN
INTRAMUSCULAR | Status: AC
  Filled 2021-06-18: qty 2

## 2021-06-18 MED ORDER — FENTANYL CITRATE (PF) 100 MCG/2ML IJ SOLN: 25.0000 ug | INTRAMUSCULAR | Status: DC | PRN

## 2021-06-18 MED ORDER — IOHEXOL 300 MG/ML  SOLN
INTRAMUSCULAR | Status: DC | PRN
  Administered 2021-06-18 (×2): 10 mL via URETHRAL

## 2021-06-18 MED ORDER — MIDAZOLAM HCL 2 MG/2ML IJ SOLN
INTRAMUSCULAR | Status: AC
  Filled 2021-06-18: qty 2

## 2021-06-18 MED ORDER — DEXAMETHASONE SODIUM PHOSPHATE 10 MG/ML IJ SOLN
INTRAMUSCULAR | Status: DC | PRN
  Administered 2021-06-18: 5 mg via INTRAVENOUS

## 2021-06-18 MED ORDER — ACETAMINOPHEN 500 MG PO TABS
1000.0000 mg | ORAL_TABLET | Freq: Once | ORAL | Status: AC
  Administered 2021-06-18: 1000 mg via ORAL

## 2021-06-18 MED ORDER — STERILE WATER FOR IRRIGATION IR SOLN
Status: DC | PRN
  Administered 2021-06-18: 3000 mL

## 2021-06-18 MED ORDER — ACETAMINOPHEN 500 MG PO TABS
ORAL_TABLET | ORAL | Status: AC
  Filled 2021-06-18: qty 2

## 2021-06-18 MED ORDER — CEFAZOLIN SODIUM-DEXTROSE 2-4 GM/100ML-% IV SOLN
INTRAVENOUS | Status: AC
  Filled 2021-06-18: qty 100

## 2021-06-18 MED ORDER — PROPOFOL 10 MG/ML IV BOLUS
INTRAVENOUS | Status: AC
  Filled 2021-06-18: qty 20

## 2021-06-18 MED ORDER — DEXAMETHASONE SODIUM PHOSPHATE 10 MG/ML IJ SOLN
INTRAMUSCULAR | Status: AC
  Filled 2021-06-18: qty 1

## 2021-06-18 MED ORDER — FENTANYL CITRATE (PF) 100 MCG/2ML IJ SOLN
INTRAMUSCULAR | Status: DC | PRN
  Administered 2021-06-18 (×2): 50 ug via INTRAVENOUS

## 2021-06-18 MED ORDER — LIDOCAINE 2% (20 MG/ML) 5 ML SYRINGE
INTRAMUSCULAR | Status: DC | PRN
  Administered 2021-06-18: 100 mg via INTRAVENOUS

## 2021-06-18 MED ORDER — PROMETHAZINE HCL 25 MG/ML IJ SOLN: 6.2500 mg | INTRAMUSCULAR | Status: DC | PRN

## 2021-06-18 MED ORDER — LACTATED RINGERS IV SOLN: INTRAVENOUS | Status: DC

## 2021-06-18 MED ORDER — LIDOCAINE HCL (PF) 2 % IJ SOLN
INTRAMUSCULAR | Status: AC
  Filled 2021-06-18: qty 10

## 2021-06-18 MED ORDER — OXYCODONE-ACETAMINOPHEN 5-325 MG PO TABS: 1.0000 | ORAL_TABLET | ORAL | 0 refills | Status: DC | PRN

## 2021-06-18 MED ORDER — MIDAZOLAM HCL 5 MG/5ML IJ SOLN
INTRAMUSCULAR | Status: DC | PRN
  Administered 2021-06-18: 2 mg via INTRAVENOUS

## 2021-06-18 MED ORDER — PROPOFOL 10 MG/ML IV BOLUS
INTRAVENOUS | Status: DC | PRN
  Administered 2021-06-18: 200 mg via INTRAVENOUS

## 2021-06-18 MED ORDER — CEFAZOLIN SODIUM-DEXTROSE 2-4 GM/100ML-% IV SOLN
2.0000 g | INTRAVENOUS | Status: AC
  Administered 2021-06-18: 2 g via INTRAVENOUS

## 2021-06-18 SURGICAL SUPPLY — 25 items
BAG DRAIN URO-CYSTO SKYTR STRL (DRAIN) ×3 IMPLANT
BAG DRN UROCATH (DRAIN) ×1
BULB IRRIG PATHFIND (MISCELLANEOUS) IMPLANT
CATH URET 5FR 28IN CONE TIP (BALLOONS)
CATH URET 5FR 28IN OPEN ENDED (CATHETERS) ×3 IMPLANT
CATH URET 5FR 70CM CONE TIP (BALLOONS) IMPLANT
CLOTH BEACON ORANGE TIMEOUT ST (SAFETY) ×3 IMPLANT
ELECT REM PT RETURN 9FT ADLT (ELECTROSURGICAL)
ELECTRODE REM PT RTRN 9FT ADLT (ELECTROSURGICAL) IMPLANT
FIBER LASER FLEXIVA 365 (UROLOGICAL SUPPLIES) IMPLANT
GLOVE SURG ENC MOIS LTX SZ7.5 (GLOVE) ×3 IMPLANT
GOWN STRL REUS W/TWL LRG LVL3 (GOWN DISPOSABLE) ×3 IMPLANT
GUIDEWIRE ANG ZIPWIRE 038X150 (WIRE) IMPLANT
GUIDEWIRE STR DUAL SENSOR (WIRE) IMPLANT
IV NS IRRIG 3000ML ARTHROMATIC (IV SOLUTION) ×3 IMPLANT
KIT TURNOVER CYSTO (KITS) ×3 IMPLANT
MANIFOLD NEPTUNE II (INSTRUMENTS) IMPLANT
PACK CYSTO (CUSTOM PROCEDURE TRAY) ×3 IMPLANT
SYR 20ML LL LF (SYRINGE) ×6 IMPLANT
SYR TOOMEY IRRIG 70ML (MISCELLANEOUS) ×3
SYRINGE TOOMEY IRRIG 70ML (MISCELLANEOUS) ×1 IMPLANT
TRACTIP FLEXIVA PULS ID 200XHI (Laser) IMPLANT
TRACTIP FLEXIVA PULSE ID 200 (Laser)
TUBE CONNECTING 12'X1/4 (SUCTIONS)
TUBE CONNECTING 12X1/4 (SUCTIONS) IMPLANT

## 2021-06-18 NOTE — Interval H&P Note (Signed)
History and Physical Interval Note:  06/18/2021 9:42 AM  [name]  has presented today for surgery, with the diagnosis of PROSTATE CANCER, HYDRONEPHROSIS.  The various methods of treatment have been discussed with the patient and family. After consideration of risks, benefits and other options for treatment, the patient has consented to  Procedure(s): CYSTOSCOPY WITH BILATERAL RETROGRADE PYELOGRAM/ RIGHT URETERAL STENT PLACEMENT (Bilateral) as a surgical intervention.  The patient's history has been reviewed, patient examined, no change in status, stable for surgery.  I have reviewed the patient's chart and labs.  Questions were answered to the patient's satisfaction.     [name]

## 2021-06-18 NOTE — Discharge Instructions (Addendum)
**May take Tylenol after 4pm, if needed[facility] [phone] Post Ureteroscopy With or Without Stent Instructions  Definitions:  Ureter: The duct that transports urine from the kidney to the bladder. Stent:   A plastic hollow tube that is placed into the ureter, from the kidney to the bladder to prevent the ureter from swelling shut.  GENERAL INSTRUCTIONS:  Despite the fact that no skin incisions were used, the area around the ureter and bladder is raw and irritated. The stent is a foreign body which will further irritate the bladder wall. This irritation is manifested by increased frequency of urination, both day and night, and by an increase in the urge to urinate. In some, the urge to urinate is present almost always. Sometimes the urge is strong enough that you may not be able to stop yourself from urinating. The only real cure is to remove the stent and then give time for the bladder wall to heal which can't be done until the danger of the ureter swelling shut has passed, which varies.  You may see some blood in your urine while the stent is in place and a few days afterwards. Do not be alarmed, even if the urine was clear for a while. Get off your feet and drink lots of fluids until clearing occurs. If you start to pass clots or don't improve, call us.  DIET: You may return to your normal diet immediately. Because of the raw surface of your bladder, alcohol, spicy foods, acid type foods and drinks with caffeine may cause irritation or frequency and should be used in moderation. To keep your urine flowing freely and to avoid constipation, drink plenty of fluids during the day ( 8-10 glasses ). Tip: Avoid cranberry juice because it is very acidic.  ACTIVITY: Your physical activity doesn't need to be restricted. However, if you are very active, you may see some blood in your urine. We suggest that you reduce your activity under these circumstances until the bleeding  has stopped.  BOWELS: It is important to keep your bowels regular during the postoperative period. Straining with bowel movements can cause bleeding. A bowel movement every other day is reasonable. Use a mild laxative if needed, such as Milk of Magnesia 2-3 tablespoons, or 2 Dulcolax tablets. Call if you continue to have problems. If you have been taking narcotics for pain, before, during or after your surgery, you may be constipated. Take a laxative if necessary.   MEDICATION: You should resume your pre-surgery medications unless told not to. In addition you will often be given an antibiotic to prevent infection. These should be taken as prescribed until the bottles are finished unless you are having an unusual reaction to one of the drugs.  PROBLEMS YOU SHOULD REPORT TO [place]: Fevers over 100.5 Fahrenheit. Heavy bleeding, or clots ( See above notes about blood in urine ). Inability to urinate. Drug reactions ( hives, rash, nausea, vomiting, diarrhea ). Severe burning or pain with urination that is not improving.  FOLLOW-UP: You will need a follow-up appointment to monitor your progress. Call for this appointment at the number listed above. Usually the first appointment will be about three to fourteen days after your surgery.   Post Anesthesia Home Care Instructions  Activity: Get plenty of rest for the remainder of the day. A responsible individual must stay with you for 24 hours following the procedure.  For the next 24 hours, DO NOT: -Drive a car -Paediatric nurse -Drink alcoholic beverages -Take  any medication unless instructed by your physician -Make any legal decisions or sign important papers.  Meals: Start with liquid foods such as gelatin or soup. Progress to regular foods as tolerated. Avoid greasy, spicy, heavy foods. If nausea and/or vomiting occur, drink only clear liquids until the nausea and/or vomiting subsides. Call your physician if vomiting continues.  Special  Instructions/Symptoms: Your throat may feel dry or sore from the anesthesia or the breathing tube placed in your throat during surgery. If this causes discomfort, gargle with warm salt water. The discomfort should disappear within 24 hours.  If you had a scopolamine patch placed behind your ear for the management of post- operative nausea and/or vomiting:  1. The medication in the patch is effective for 72 hours, after which it should be removed.  Wrap patch in a tissue and discard in the trash. Wash hands thoroughly with soap and water. 2. You may remove the patch earlier than 72 hours if you experience unpleasant side effects which may include dry mouth, dizziness or visual disturbances. 3. Avoid touching the patch. Wash your hands with soap and water after contact with the patch.

## 2021-06-18 NOTE — Op Note (Signed)
Preoperative diagnosis:  1.  Metastatic prostate cancer with associated right hydronephrosis  Postoperative diagnosis: 1.  Same  Procedure(s): 1.  Cystoscopy, bilateral retrograde pyelogram with intraoperative interpretation, insertion right JJ stent  Surgeon: Dr. Harold Barban  Anesthesia: General  Complications: None  EBL: Minimal  Specimens: None  Disposition of specimens: Not applicable  Intraoperative findings: Patient has trilobar prostatic hypertrophy with elevated bladder neck and somewhat rigid bladder neck as well.  No obvious mucosal bladder lesions.  Hydronephrosis noted on the right side on retrograde pyelogram with some narrowing of the proximal ureter.  6 [place] by 26 cm Percuflex plus soft contour stent placed.  Left retrograde mild dilatation of the ureter with emptied out promptly upon removal of the retrograde catheter.  No stent placed on the left  Indication: Patient is a 55 year old African-American male with metastatic prostate cancer.  He has had progression of retroperitoneal lymphadenopathy now pressing on the right ureter causing hydronephrosis and right-sided flank pain.  Presents at this time undergo insertion of right JJ stent.  Description of procedure:  After obtaining informed consent for the patient is taken the major cystoscopy suite placed under general anesthesia.  Placed in the dorsolithotomy position genitalia prepped and draped in usual sterile fashion.  Proper pause and timeout was performed.  46 French cystoscope was advanced into the bladder.  The bladder neck was elevated and somewhat rigid but able to pass the scope without too much difficulty.  Bladder thoroughly spread with 30 and 70 degree lenses revealed no mucosal lesions.  Ureteral orifice ease were in normal position and were effluxing urine.  A 5 French open tip catheter was utilized perform retrograde pyelogram on the right side with above-noted findings.  A sensor wire was passed  through the open tip catheter and threaded up to the right renal pelvis.  The open tip catheter was removed and a 6 [place] by 26 cm Percuflex plus soft Contour stent was placed leaving a proximal coil in the renal pelvis and a distal coil in the bladder.  There was flow of urine through and around the stent noted.  Similarly retrograde pyelogram was performed on the left side with above-noted findings.  No stent was indicated.  Bladder was emptied procedure terminated he was awakened from anesthesia and taken back to the recovery room in stable condition.  No immediate complication from the procedure.

## 2021-06-18 NOTE — Anesthesia Preprocedure Evaluation (Addendum)
Anesthesia Evaluation  Patient identified by MRN, date of birth, ID band Patient awake    Reviewed: Allergy & Precautions, NPO status , Patient's Chart, lab work & pertinent test results  Airway Mallampati: II  TM Distance: >3 FB Neck ROM: Full    Dental  (+) Teeth Intact, Dental Advisory Given, Chipped,    Pulmonary former smoker,    Pulmonary exam normal breath sounds clear to auscultation       Cardiovascular hypertension, Pt. on medications Normal cardiovascular exam Rhythm:Regular Rate:Normal     Neuro/Psych  Neuromuscular disease    GI/Hepatic Neg liver ROS, GERD  Medicated,  Endo/Other  Morbid obesity  Renal/GU Renal disease (Hydronephrosis of right kidney)   Prostate cancer     Musculoskeletal  (+) Arthritis ,   Abdominal   Peds  Hematology negative hematology ROS (+)   Anesthesia Other Findings Day of surgery medications reviewed with the patient.  Reproductive/Obstetrics                            Anesthesia Physical Anesthesia Plan  ASA: 3  Anesthesia Plan: General   Post-op Pain Management:    Induction: Intravenous  PONV Risk Score and Plan: 2 and Midazolam, Dexamethasone and Ondansetron  Airway Management Planned: LMA  Additional Equipment:   Intra-op Plan:   Post-operative Plan: Extubation in OR  Informed Consent: I have reviewed the patients History and Physical, chart, labs and discussed the procedure including the risks, benefits and alternatives for the proposed anesthesia with the patient or authorized representative who has indicated his/her understanding and acceptance.     Dental advisory given  Plan Discussed with: CRNA  Anesthesia Plan Comments:         Anesthesia Quick Evaluation

## 2021-06-18 NOTE — Transfer of Care (Signed)
Immediate Anesthesia Transfer of Care Note  Patient: [name]  Procedure(s) Performed: CYSTOSCOPY WITH BILATERAL RETROGRADE PYELOGRAM/ RIGHT URETERAL STENT PLACEMENT (Bilateral: Ureter)  Patient Location: PACU  Anesthesia Type:General  Level of Consciousness: sedated  Airway & Oxygen Therapy: Patient Spontanous Breathing and Patient connected to nasal cannula oxygen  Post-op Assessment: Report given to RN and Post -op Vital signs reviewed and stable  Post vital signs: Reviewed and stable  Last Vitals:  Vitals Value Taken Time  BP 139/96 06/18/21 1228  Temp    Pulse 81 06/18/21 1231  Resp 17 06/18/21 1231  SpO2 97 % 06/18/21 1231  Vitals shown include unvalidated device data.  Last Pain:  Vitals:   06/18/21 0916  TempSrc: Oral  PainSc: 0-No pain      Patients Stated Pain Goal: 4 (03/49/17 9150)  Complications: No notable events documented.

## 2021-06-18 NOTE — Anesthesia Procedure Notes (Signed)
Procedure Name: LMA Insertion Date/Time: 06/18/2021 11:50 AM Performed by: Maryella Shivers, CRNA Pre-anesthesia Checklist: Patient identified, Emergency Drugs available, Suction available and Patient being monitored Patient Re-evaluated:Patient Re-evaluated prior to induction Oxygen Delivery Method: Circle system utilized Preoxygenation: Pre-oxygenation with 100% oxygen Induction Type: IV induction Ventilation: Mask ventilation without difficulty LMA: LMA inserted LMA Size: 5.0 Number of attempts: 1 Airway Equipment and Method: Bite block Placement Confirmation: positive ETCO2 Tube secured with: Tape Dental Injury: Teeth and Oropharynx as per pre-operative assessment

## 2021-06-19 ENCOUNTER — Encounter (HOSPITAL_BASED_OUTPATIENT_CLINIC_OR_DEPARTMENT_OTHER): Payer: Self-pay | Admitting: Urology

## 2021-06-19 NOTE — Anesthesia Postprocedure Evaluation (Signed)
Anesthesia Post Note  Patient: [name]  Procedure(s) Performed: CYSTOSCOPY WITH BILATERAL RETROGRADE PYELOGRAM/ RIGHT URETERAL STENT PLACEMENT (Bilateral: Ureter)     Patient location during evaluation: PACU Anesthesia Type: General Level of consciousness: awake and alert Pain management: pain level controlled Vital Signs Assessment: post-procedure vital signs reviewed and stable Respiratory status: spontaneous breathing, nonlabored ventilation, respiratory function stable and patient connected to nasal cannula oxygen Cardiovascular status: blood pressure returned to baseline and stable Postop Assessment: no apparent nausea or vomiting Anesthetic complications: no   No notable events documented.  Last Vitals:  Vitals:   06/18/21 1258 06/18/21 1315  BP: (!) 147/93 (!) 154/74  Pulse: 79 75  Resp: 15 16  Temp:  36.6 C  SpO2: 95% 95%    Last Pain:  Vitals:   06/18/21 1330  TempSrc:   PainSc: 0-No pain                 Catalina Gravel

## 2021-06-24 ENCOUNTER — Other Ambulatory Visit: Payer: Self-pay

## 2021-06-24 MED FILL — Pantoprazole Sodium EC Tab 20 MG (Base Equiv): ORAL | 30 days supply | Qty: 30 | Fill #2 | Status: AC

## 2021-06-24 MED FILL — Losartan Potassium & Hydrochlorothiazide Tab 50-12.5 MG: ORAL | 30 days supply | Qty: 30 | Fill #2 | Status: AC

## 2021-06-25 ENCOUNTER — Other Ambulatory Visit: Payer: Self-pay

## 2021-07-09 ENCOUNTER — Other Ambulatory Visit: Payer: Self-pay

## 2021-07-09 MED ORDER — TRAMADOL HCL 50 MG PO TABS
ORAL_TABLET | ORAL | 0 refills | Status: DC
  Filled 2021-07-09: qty 20, 5d supply, fill #0

## 2021-07-09 MED ORDER — TAMSULOSIN HCL 0.4 MG PO CAPS
ORAL_CAPSULE | ORAL | 11 refills | Status: DC
  Filled 2021-07-09: qty 30, 30d supply, fill #0
  Filled 2021-08-19: qty 30, 30d supply, fill #1
  Filled 2021-10-06: qty 30, 30d supply, fill #2
  Filled 2021-11-08: qty 30, 30d supply, fill #3
  Filled 2021-12-15: qty 30, 30d supply, fill #4
  Filled 2022-01-20: qty 30, 30d supply, fill #5
  Filled 2022-01-21: qty 30, 30d supply, fill #0
  Filled 2022-02-22: qty 30, 30d supply, fill #1
  Filled 2022-03-31: qty 30, 30d supply, fill #2
  Filled 2022-05-04: qty 30, 30d supply, fill #3
  Filled 2022-06-08: qty 30, 30d supply, fill #4

## 2021-07-18 ENCOUNTER — Other Ambulatory Visit: Payer: Self-pay

## 2021-07-24 ENCOUNTER — Ambulatory Visit: Payer: Medicaid Other | Admitting: Nurse Practitioner

## 2021-07-31 ENCOUNTER — Other Ambulatory Visit: Payer: Self-pay

## 2021-07-31 MED FILL — Pantoprazole Sodium EC Tab 20 MG (Base Equiv): ORAL | 30 days supply | Qty: 30 | Fill #3 | Status: CN

## 2021-07-31 MED FILL — Losartan Potassium & Hydrochlorothiazide Tab 50-12.5 MG: ORAL | 30 days supply | Qty: 30 | Fill #3 | Status: AC

## 2021-08-01 ENCOUNTER — Ambulatory Visit (INDEPENDENT_AMBULATORY_CARE_PROVIDER_SITE_OTHER): Payer: BLUE CROSS/BLUE SHIELD | Admitting: Family Medicine

## 2021-08-01 ENCOUNTER — Other Ambulatory Visit: Payer: Self-pay

## 2021-08-01 ENCOUNTER — Encounter: Payer: Self-pay | Admitting: Family Medicine

## 2021-08-01 VITALS — BP 145/93 | HR 87 | Ht 70.0 in | Wt 290.0 lb

## 2021-08-01 DIAGNOSIS — C61 Malignant neoplasm of prostate: Secondary | ICD-10-CM | POA: Diagnosis not present

## 2021-08-01 DIAGNOSIS — Z7689 Persons encountering health services in other specified circumstances: Secondary | ICD-10-CM | POA: Diagnosis not present

## 2021-08-01 DIAGNOSIS — N529 Male erectile dysfunction, unspecified: Secondary | ICD-10-CM | POA: Diagnosis not present

## 2021-08-01 DIAGNOSIS — R739 Hyperglycemia, unspecified: Secondary | ICD-10-CM | POA: Diagnosis not present

## 2021-08-01 LAB — POCT GLYCOSYLATED HEMOGLOBIN (HGB A1C): HbA1c, POC (prediabetic range): 6.4 % (ref 5.7–6.4)

## 2021-08-01 MED ORDER — SILDENAFIL CITRATE 100 MG PO TABS
100.0000 mg | ORAL_TABLET | Freq: Every day | ORAL | 3 refills | Status: DC | PRN
  Filled 2021-08-01: qty 4, 30d supply, fill #0

## 2021-08-01 MED FILL — Pantoprazole Sodium EC Tab 20 MG (Base Equiv): ORAL | 30 days supply | Qty: 30 | Fill #3 | Status: CN

## 2021-08-01 NOTE — Patient Instructions (Signed)
It was great seeing you today.  I am glad you are doing well.  I have placed a referral for you to see a new urologist.  Someone should be contacting you to schedule an appointment.  Regarding your medication refills I refilled the medications requested.  I am checking a hemoglobin A1c which is a measurement for diabetes and we will call you if they are abnormal results.  If there are no changes needed I will send you a message on MyChart.  If you have any questions or concerns please call the clinic.  I hope you have a wonderful afternoon!

## 2021-08-01 NOTE — Progress Notes (Signed)
    SUBJECTIVE:   CHIEF COMPLAINT / HPI:   New patient visit  Current concerns Prostate cancer patient reports she is currently being treated by [facility] but he is not pleased with his care and would like to transfer somewhere else if possible.  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 Lives with wife and grandson. Denies substance use.   Health maintenance Colonoscopy in 2020 CTs and MRIs in 2022   OBJECTIVE:   BP (!) 145/93   Pulse 87   Ht '5\' 10"'$  (1.778 m)   Wt 290 lb (131.5 kg)   SpO2 98%   BMI 41.61 kg/m   General: Well-appearing 55 year old male, no acute distress Cardiac: Regular rate and rhythm, no murmurs appreciated Respiratory: Normal work of breathing, lungs clear to auscultation bilaterally Abdomen: Soft, nontender, positive bowel sounds MSK: No gross abnormalities, full strength in upper and lower extremities, neuro: No gross abnormalities, cranial nerves intact  ASSESSMENT/PLAN:   Encounter to establish care Patient is here to establish care with a new provider because his insurance changed and his previous provider did not accept his new insurance.  Reports overall doing well but is unpleased with care from his urology team and would like a new urology team to help manage his prostate cancer.  Referral placed for this today.  Patient has not had a recent hemoglobin A1c and has hyperglycemia on recent lab work.  A1c today was 6.4.  We will continue to monitor.     Gifford Shave,  MD [place]

## 2021-08-02 DIAGNOSIS — Z7689 Persons encountering health services in other specified circumstances: Secondary | ICD-10-CM

## 2021-08-02 HISTORY — DX: Persons encountering health services in other specified circumstances: Z76.89

## 2021-08-02 NOTE — Assessment & Plan Note (Signed)
Patient is here to establish care with a new provider because his insurance changed and his previous provider did not accept his new insurance.  Reports overall doing well but is unpleased with care from his urology team and would like a new urology team to help manage his prostate cancer.  Referral placed for this today.  Patient has not had a recent hemoglobin A1c and has hyperglycemia on recent lab work.  A1c today was 6.4.  We will continue to monitor.

## 2021-08-05 ENCOUNTER — Other Ambulatory Visit: Payer: Self-pay

## 2021-08-09 ENCOUNTER — Other Ambulatory Visit: Payer: Self-pay

## 2021-08-20 ENCOUNTER — Other Ambulatory Visit: Payer: Self-pay

## 2021-08-21 ENCOUNTER — Other Ambulatory Visit: Payer: Self-pay

## 2021-09-03 ENCOUNTER — Other Ambulatory Visit: Payer: Self-pay | Admitting: Nurse Practitioner

## 2021-09-03 DIAGNOSIS — I10 Essential (primary) hypertension: Secondary | ICD-10-CM

## 2021-09-04 ENCOUNTER — Other Ambulatory Visit: Payer: Self-pay

## 2021-09-04 MED ORDER — LOSARTAN POTASSIUM-HCTZ 50-12.5 MG PO TABS
1.0000 | ORAL_TABLET | Freq: Every day | ORAL | 3 refills | Status: DC
Start: 2021-09-04 — End: 2022-09-30
  Filled 2021-09-04: qty 90, 90d supply, fill #0
  Filled 2021-11-29: qty 90, 90d supply, fill #1
  Filled 2022-03-13: qty 90, 90d supply, fill #2
  Filled 2022-03-14: qty 90, 90d supply, fill #0
  Filled 2022-06-22: qty 90, 90d supply, fill #1

## 2021-09-04 MED ORDER — PANTOPRAZOLE SODIUM 20 MG PO TBEC
DELAYED_RELEASE_TABLET | Freq: Every day | ORAL | 3 refills | Status: DC
  Filled 2021-09-04: qty 90, 90d supply, fill #0

## 2021-09-26 ENCOUNTER — Ambulatory Visit: Payer: Medicaid Other | Admitting: Nurse Practitioner

## 2021-09-29 ENCOUNTER — Emergency Department (HOSPITAL_COMMUNITY): Payer: BLUE CROSS/BLUE SHIELD

## 2021-09-29 ENCOUNTER — Emergency Department (HOSPITAL_COMMUNITY)
Admission: EM | Admit: 2021-09-29 | Discharge: 2021-09-30 | Disposition: A | Discharge status: Home / Self Care | Payer: BLUE CROSS/BLUE SHIELD | Attending: Emergency Medicine | Admitting: Emergency Medicine

## 2021-09-29 DIAGNOSIS — Z96651 Presence of right artificial knee joint: Secondary | ICD-10-CM | POA: Insufficient documentation

## 2021-09-29 DIAGNOSIS — Z87891 Personal history of nicotine dependence: Secondary | ICD-10-CM | POA: Diagnosis not present

## 2021-09-29 DIAGNOSIS — M545 Low back pain, unspecified: Secondary | ICD-10-CM | POA: Diagnosis present

## 2021-09-29 DIAGNOSIS — M5136 Other intervertebral disc degeneration, lumbar region: Secondary | ICD-10-CM | POA: Diagnosis not present

## 2021-09-29 DIAGNOSIS — Z79899 Other long term (current) drug therapy: Secondary | ICD-10-CM | POA: Diagnosis not present

## 2021-09-29 DIAGNOSIS — Z7982 Long term (current) use of aspirin: Secondary | ICD-10-CM | POA: Insufficient documentation

## 2021-09-29 DIAGNOSIS — I10 Essential (primary) hypertension: Secondary | ICD-10-CM | POA: Insufficient documentation

## 2021-09-29 DIAGNOSIS — Z8546 Personal history of malignant neoplasm of prostate: Secondary | ICD-10-CM | POA: Diagnosis not present

## 2021-09-29 MED ORDER — KETOROLAC TROMETHAMINE 30 MG/ML IJ SOLN
30.0000 mg | Freq: Once | INTRAMUSCULAR | Status: AC
  Administered 2021-09-30: 30 mg via INTRAMUSCULAR
  Filled 2021-09-29: qty 1

## 2021-09-29 MED ORDER — METHOCARBAMOL 500 MG PO TABS
1000.0000 mg | ORAL_TABLET | Freq: Once | ORAL | Status: AC
  Administered 2021-09-30: 1000 mg via ORAL
  Filled 2021-09-29: qty 2

## 2021-09-29 MED ORDER — ACETAMINOPHEN 500 MG PO TABS
1000.0000 mg | ORAL_TABLET | Freq: Once | ORAL | Status: AC
  Administered 2021-09-30: 1000 mg via ORAL
  Filled 2021-09-29: qty 2

## 2021-09-29 MED ORDER — LIDOCAINE 5 % EX PTCH
2.0000 | MEDICATED_PATCH | CUTANEOUS | Status: DC
  Administered 2021-09-30: 2 via TRANSDERMAL
  Filled 2021-09-29: qty 2

## 2021-09-29 NOTE — ED Provider Notes (Signed)
Emergency Medicine Provider Triage Evaluation Note  [name] , a 55 y.o. male  was evaluated in triage.  Pt complains of lower left paraspinal tenderness. Denies any falls, traumas, or MVCs. Patient reports he has had chronic back pain for a year and his PCP is aware.  No midline tenderness.    Pulses intact.  He denies any urinary retention.  Reports he is having trouble walking due to pain, but not weakness.  Denies any urinary incontinence or fecal incontinence.  Denies fevers or IV drug use ever.  Review of Systems  Positive: Back pain Negative: Urinary or fecal incontinence, dysuria, hematuria, fevers, urinary tension  Physical Exam  BP 139/82 (BP Location: Right Arm)   Pulse 86   Temp 99.4 F (37.4 C) (Oral)   Resp 15   SpO2 95%  Gen:   Awake, no distress   Resp:  Normal effort  MSK:   Moves extremities without difficulty. Patient has good and equal strength in bilateral lower extremities.  No midline tenderness.  Sensation intact.  Tender to left paraspinal lumbar region.  No deformities palpated Other:    Medical Decision Making  Medically screening exam initiated at 7:17 PM.  Appropriate orders placed.  [name] was informed that the remainder of the evaluation will be completed by another provider, this initial triage assessment does not replace that evaluation, and the importance of remaining in the ED until their evaluation is complete.  No cauda equina signs. No UTI symptoms. XR ordered   Sherrell Puller, PA-C 09/29/21 Royann Shivers, MD 09/30/21 [phone]

## 2021-09-29 NOTE — ED Triage Notes (Signed)
Pt c/o lower left side back pain x3 days, advises back pain "for years," states he feels like he can no longer walk, unable to put pressure on L leg. Pt advises he's seen PCP for same, unable to get answers.  Hx prostate cancer, hx R kidney issue- stent placed 2022

## 2021-09-30 ENCOUNTER — Other Ambulatory Visit: Payer: Self-pay

## 2021-09-30 ENCOUNTER — Encounter (HOSPITAL_COMMUNITY): Payer: Self-pay | Admitting: Emergency Medicine

## 2021-09-30 LAB — URINALYSIS, ROUTINE W REFLEX MICROSCOPIC
Bilirubin Urine: NEGATIVE
Glucose, UA: NEGATIVE mg/dL
Ketones, ur: NEGATIVE mg/dL
Nitrite: NEGATIVE
Protein, ur: 100 mg/dL — AB
RBC / HPF: >50 RBC/hpf — ABNORMAL HIGH (ref 0–5)
Specific Gravity, Urine: 1.026 (ref 1.005–1.030)
pH: 5 (ref 5.0–8.0)

## 2021-09-30 MED ORDER — METHOCARBAMOL 500 MG PO TABS
500.0000 mg | ORAL_TABLET | Freq: Two times a day (BID) | ORAL | 0 refills | Status: DC
  Filled 2021-09-30: qty 20, 10d supply, fill #0

## 2021-09-30 MED ORDER — LIDOCAINE 5 % EX PTCH
1.0000 | MEDICATED_PATCH | CUTANEOUS | 0 refills | Status: DC
  Filled 2021-09-30: qty 30, 30d supply, fill #0

## 2021-09-30 NOTE — ED Provider Notes (Signed)
[facility] EMERGENCY DEPARTMENT Provider Note   CSN: [id number] Arrival date & time: 09/29/21  1752     History Chief Complaint  Patient presents with   Back Pain    [name] is a 55 y.o. male.  The history is provided by the patient.  Back Pain Pain location: left of lumbar spine. Quality:  Aching Radiates to:  Does not radiate Pain severity:  Moderate Pain is:  Same all the time Onset quality: chronic with acute worsening for 3 days. Duration: years but worse for 3 days. Timing:  Constant Progression:  Worsening Chronicity:  Chronic Context: not MCA and not MVA   Relieved by:  Nothing Worsened by:  Nothing Ineffective treatments:  None tried Associated symptoms: no abdominal pain, no abdominal swelling, no bladder incontinence, no bowel incontinence, no chest pain, no dysuria, no fever, no headaches, no leg pain, no numbness, no paresthesias, no pelvic pain, no perianal numbness, no tingling, no weakness and no weight loss   Patient with chronic back pain, and prostate cancer with negative PET scan 09/23/21 presents with worsening chronic back pain.  No trauma.  No weakness, no numbness, no bowel or bladder symptoms.      Past Medical History:  Diagnosis Date   Arthritis    Chronic bilateral low back pain with right-sided sciatica    ED (erectile dysfunction)    GERD (gastroesophageal reflux disease)    Hydronephrosis of right kidney    Hypertension    followed by pcp   (nuclear test study 02-28-2010 in epic, normal no ischemia, nuclear ef 51%)   Pre-diabetes    Prostate cancer ([place])    urologist--dr newsome   -- dx 04/ 2022,   started ADT 06-14-2021   Wears glasses     Patient Active Problem List   Diagnosis Date Noted   Encounter to establish care 08/02/2021   Erectile dysfunction 02/27/2020   Back pain without sciatica 02/27/2020   Class 3 severe obesity due to excess calories with serious comorbidity and body mass index (BMI) of  40.0 to 44.9 in adult ([place]) 02/27/2020   Status post total knee replacement, right 10/15/2018   Arthritis of right knee 07/16/2018   Prediabetes 01/02/2017   Essential hypertension 09/04/2016    Past Surgical History:  Procedure Laterality Date   COLONOSCOPY  02/04/2006   Dr.Jacobs   CYSTOSCOPY W/ URETERAL STENT PLACEMENT Bilateral 06/18/2021   Procedure: CYSTOSCOPY WITH BILATERAL RETROGRADE PYELOGRAM/ RIGHT URETERAL STENT PLACEMENT;  Surgeon: [name], MD;  Location: [facility];  Service: Urology;  Laterality: Bilateral;   LAPAROSCOPIC CHOLECYSTECTOMY  01/20/2008   @ [place]   TOTAL KNEE ARTHROPLASTY Right 07/16/2018   Procedure: RIGHT TOTAL KNEE REPLACEMENT;  Surgeon: Marybelle Killings, MD;  Location: [place];  Service: Orthopedics;  Laterality: Right;       Family History  Problem Relation Age of Onset   Cancer Father        prostate    Colon cancer Father 7   Cancer Maternal Grandfather        prostate   Colon cancer Maternal Uncle    Colon cancer Paternal Grandfather    Esophageal cancer Neg Hx    Rectal cancer Neg Hx    Stomach cancer Neg Hx     Social History   Tobacco Use   Smoking status: Former    Years: 10.00    Types: Cigarettes    Quit date: 11/20/2015    Years  since quitting: 5.8   Smokeless tobacco: Never  Vaping Use   Vaping Use: Never used  Substance Use Topics   Alcohol use: No   Drug use: No    Home Medications Prior to Admission medications   Medication Sig Start Date End Date Taking? Authorizing Provider  lidocaine (LIDODERM) 5 % Place 1 patch onto the skin daily. Remove & Discard patch within 12 hours or as directed by MD 09/30/21  Yes Saman Giddens, MD  methocarbamol (ROBAXIN) 500 MG tablet Take 1 tablet (500 mg total) by mouth 2 (two) times daily. 09/30/21  Yes Jashay Roddy, MD  acetaminophen (TYLENOL) 500 MG tablet Take 500 mg by mouth every 6 (six) hours as needed for moderate pain or headache.    [provider]  aspirin EC 325 MG EC tablet Take 1 tablet (325 mg total) by mouth daily with breakfast. 07/18/18   Aundra Dubin, PA-C  HYDROcodone-acetaminophen (NORCO/VICODIN) 5-325 MG tablet Take 1 tablet by mouth every 6 (six) hours as needed. 06/11/21   Ripley Fraise, MD  ibuprofen (ADVIL) 200 MG tablet Take 200 mg by mouth every 6 (six) hours as needed for headache or moderate pain.    [provider]  losartan-hydrochlorothiazide (HYZAAR) 50-12.5 MG tablet TAKE 1 TABLET BY MOUTH DAILY. 09/04/21 09/04/22  Vevelyn Francois, NP  oxyCODONE-acetaminophen (PERCOCET) 5-325 MG tablet Take 1 tablet by mouth every 4 (four) hours as needed for severe pain. 06/18/21 06/18/22  [name], MD  pantoprazole (PROTONIX) 20 MG tablet TAKE 1 TABLET (20 MG TOTAL) BY MOUTH DAILY. 09/04/21 09/04/22  Vevelyn Francois, NP  sildenafil (VIAGRA) 100 MG tablet Take 1 tablet (100 mg total) by mouth daily as needed for erectile dysfunction. 08/01/21   Gifford Shave, MD  tamsulosin (FLOMAX) 0.4 MG CAPS capsule take 1 capsule by mouth once daily 07/09/21       Allergies    Patient has no known allergies.  Review of Systems   Review of Systems  Constitutional:  Negative for fever and weight loss.  HENT:  Negative for facial swelling.   Eyes:  Negative for redness.  Respiratory:  Negative for wheezing and stridor.   Cardiovascular:  Negative for chest pain.  Gastrointestinal:  Negative for abdominal pain and bowel incontinence.  Genitourinary:  Negative for bladder incontinence, dysuria and pelvic pain.  Musculoskeletal:  Positive for back pain.  Skin:  Negative for rash.  Neurological:  Negative for tingling, weakness, numbness, headaches and paresthesias.  Psychiatric/Behavioral:  Negative for agitation.   All other systems reviewed and are negative.  Physical Exam Updated Vital Signs BP (!) 156/94   Pulse 84   Temp 97.9 F (36.6 C)   Resp 16   SpO2 95%   Physical Exam  ED Results / Procedures /  Treatments   Labs (all labs ordered are listed, but only abnormal results are displayed) Labs Reviewed  URINALYSIS, ROUTINE W REFLEX MICROSCOPIC - Abnormal; Notable for the following components:      Result Value   APPearance HAZY (*)    Hgb urine dipstick MODERATE (*)    Protein, ur 100 (*)    Leukocytes,Ua TRACE (*)    RBC / HPF >50 (*)    Bacteria, UA RARE (*)    All other components within normal limits    EKG None  Radiology DG Lumbar Spine Complete  Result Date: 09/29/2021 CLINICAL DATA:  Back pain for several days EXAM: LUMBAR SPINE - COMPLETE 4+ VIEW COMPARISON:  06/10/2021 FINDINGS: Right ureteral stent is noted consistent with the given clinical history. Five lumbar type vertebral bodies are well visualized. Vertebral body height is well maintained. No pars defects are noted. Multilevel osteophytic changes are seen. No anterolisthesis is seen. No other focal abnormality is noted. IMPRESSION: Degenerative change of the lumbar spine. Stable right ureteral stent. Electronically Signed   By: Inez Catalina M.D.   On: 09/29/2021 20:20    Procedures Procedures   Medications Ordered in ED Medications  lidocaine (LIDODERM) 5 % 2 patch (2 patches Transdermal Patch Applied 09/30/21 0011)  ketorolac (TORADOL) 30 MG/ML injection 30 mg (30 mg Intramuscular Given 09/30/21 0011)  acetaminophen (TYLENOL) tablet 1,000 mg (1,000 mg Oral Given 09/30/21 0011)  methocarbamol (ROBAXIN) tablet 1,000 mg (1,000 mg Oral Given 09/30/21 0011)    ED Course  I have reviewed the triage vital signs and the nursing notes.  Pertinent labs & imaging results that were available during my care of the patient were reviewed by me and considered in my medical decision making (see chart for details).   Symptoms are chronic, has had a negative PET scan within 8 days.  No weakness no numbness, no bowel or bladder symptoms.  Urine is not infected and patient has no urinary symptoms.  Medications initiated.  Follow  up with your PMD for ongoing care.     VAL SCHIAVO was evaluated in Emergency Department on 09/30/2021 for the symptoms described in the history of present illness. He was evaluated in the context of the global COVID-19 pandemic, which necessitated consideration that the patient might be at risk for infection with the SARS-CoV-2 virus that causes COVID-19. Institutional protocols and algorithms that pertain to the evaluation of patients at risk for COVID-19 are in a state of rapid change based on information released by regulatory bodies including the CDC and federal and state organizations. These policies and algorithms were followed during the patient's care in the ED.  Final Clinical Impression(s) / ED Diagnoses Final diagnoses:  DDD (degenerative disc disease), lumbar   Return for intractable cough, coughing up blood, fevers > 100.4 unrelieved by medication, shortness of breath, intractable vomiting, chest pain, shortness of breath, weakness, numbness, changes in speech, facial asymmetry, abdominal pain, passing out, Inability to tolerate liquids or food, cough, altered mental status or any concerns. No signs of systemic illness or infection. The patient is nontoxic-appearing on exam and vital signs are within normal limits. I have reviewed the triage vital signs and the nursing notes. Pertinent labs & imaging results that were available during my care of the patient were reviewed by me and considered in my medical decision making (see chart for details). After history, exam, and medical workup I feel the patient has been appropriately medically screened and is safe for discharge home. Pertinent diagnoses were discussed with the patient. Patient was given return precautions. Rx / DC Orders ED Discharge Orders          Ordered    methocarbamol (ROBAXIN) 500 MG tablet  2 times daily        09/30/21 0222    lidocaine (LIDODERM) 5 %  Every 24 hours        09/30/21 0222             Chey Rachels,  Karalyn Kadel, MD 09/30/21 [id number]

## 2021-10-07 ENCOUNTER — Other Ambulatory Visit: Payer: Self-pay

## 2021-10-07 ENCOUNTER — Encounter (HOSPITAL_COMMUNITY): Payer: Self-pay

## 2021-10-07 ENCOUNTER — Ambulatory Visit (HOSPITAL_COMMUNITY)
Admission: EM | Admit: 2021-10-07 | Discharge: 2021-10-07 | Disposition: A | Discharge status: Home / Self Care | Payer: BLUE CROSS/BLUE SHIELD | Attending: Emergency Medicine | Admitting: Emergency Medicine

## 2021-10-07 DIAGNOSIS — N390 Urinary tract infection, site not specified: Secondary | ICD-10-CM | POA: Insufficient documentation

## 2021-10-07 DIAGNOSIS — R319 Hematuria, unspecified: Secondary | ICD-10-CM | POA: Diagnosis present

## 2021-10-07 LAB — POCT URINALYSIS DIPSTICK, ED / UC
Bilirubin Urine: NEGATIVE
Glucose, UA: NEGATIVE mg/dL
Nitrite: NEGATIVE
Protein, ur: 100 mg/dL — AB
Specific Gravity, Urine: 1.025 (ref 1.005–1.030)
Urobilinogen, UA: 1 mg/dL (ref 0.0–1.0)
pH: 6 (ref 5.0–8.0)

## 2021-10-07 MED ORDER — CEPHALEXIN 500 MG PO CAPS: 500.0000 mg | ORAL_CAPSULE | Freq: Two times a day (BID) | ORAL | 0 refills | Status: AC

## 2021-10-07 NOTE — Discharge Instructions (Signed)
Take the Keflex twice a day for the next 7 days.    You can take Tylenol and/or Ibuprofen as needed for pain relief and fever reduction.   Make sure you are drinking plenty of fluids, especially water.  You can drink cranberry juice to help with symptom relief, but make sure it is cranberry juice and not cranberry cocktail.  You can also try AZO, cranberry pills, or pyridium as needed.    Return or go to the Emergency Department if symptoms worsen or do not improve in the next few days.  Follow up with your cancer doctor for re-evaluation as soon as possible.

## 2021-10-07 NOTE — ED Provider Notes (Signed)
[facility]    CSN: [id number] Arrival date & time: 10/07/21  1222      History   Chief Complaint Chief Complaint  Patient presents with   Hematuria    HPI [name] is a 55 y.o. male.   Patient here for evaluation of hematuria and dysuria that started this am.  Reports history of prostate cancer and obstructive hydronephrosis of the right kidney requiring a stent.  Reports contacted cancer doctor this am after noticing hematuria and was told to come here for evaluation of possible UTI.  Has not tried any OTC medication or treatment.  Denies any new or worsening back pain.  Denies any trauma, injury, or other precipitating event.  Denies any specific alleviating or aggravating factors.  Denies any fevers, chest pain, shortness of breath, N/V/D, numbness, tingling, weakness, abdominal pain, or headaches.    The history is provided by the patient.  Hematuria   Past Medical History:  Diagnosis Date   Arthritis    Chronic bilateral low back pain with right-sided sciatica    ED (erectile dysfunction)    GERD (gastroesophageal reflux disease)    Hydronephrosis of right kidney    Hypertension    followed by pcp   (nuclear test study 02-28-2010 in epic, normal no ischemia, nuclear ef 51%)   Pre-diabetes    Prostate cancer ([place])    urologist--dr newsome   -- dx 04/ 2022,   started ADT 06-14-2021   Wears glasses     Patient Active Problem List   Diagnosis Date Noted   Encounter to establish care 08/02/2021   Erectile dysfunction 02/27/2020   Back pain without sciatica 02/27/2020   Class 3 severe obesity due to excess calories with serious comorbidity and body mass index (BMI) of 40.0 to 44.9 in adult ([place]) 02/27/2020   Status post total knee replacement, right 10/15/2018   Arthritis of right knee 07/16/2018   Prediabetes 01/02/2017   Essential hypertension 09/04/2016    Past Surgical History:  Procedure Laterality Date   COLONOSCOPY  02/04/2006    Dr.Jacobs   CYSTOSCOPY W/ URETERAL STENT PLACEMENT Bilateral 06/18/2021   Procedure: CYSTOSCOPY WITH BILATERAL RETROGRADE PYELOGRAM/ RIGHT URETERAL STENT PLACEMENT;  Surgeon: [name], MD;  Location: [facility];  Service: Urology;  Laterality: Bilateral;   LAPAROSCOPIC CHOLECYSTECTOMY  01/20/2008   @ [facility]   TOTAL KNEE ARTHROPLASTY Right 07/16/2018   Procedure: RIGHT TOTAL KNEE REPLACEMENT;  Surgeon: Marybelle Killings, MD;  Location: [facility];  Service: Orthopedics;  Laterality: Right;       Home Medications    Prior to Admission medications   Medication Sig Start Date End Date Taking? Authorizing Provider  cephALEXin (KEFLEX) 500 MG capsule Take 1 capsule (500 mg total) by mouth 2 (two) times daily for 7 days. 10/07/21 10/14/21 Yes Pearson Forster, NP  acetaminophen (TYLENOL) 500 MG tablet Take 500 mg by mouth every 6 (six) hours as needed for moderate pain or headache.    [provider]  aspirin EC 325 MG EC tablet Take 1 tablet (325 mg total) by mouth daily with breakfast. 07/18/18   Aundra Dubin, PA-C  HYDROcodone-acetaminophen (NORCO/VICODIN) 5-325 MG tablet Take 1 tablet by mouth every 6 (six) hours as needed. 06/11/21   Ripley Fraise, MD  ibuprofen (ADVIL) 200 MG tablet Take 200 mg by mouth every 6 (six) hours as needed for headache or moderate pain.    [provider]  lidocaine (LIDODERM) 5 % Place  1 patch onto the skin daily. Remove & Discard patch within 12 hours or as directed by MD 09/30/21   Randal Buba, April, MD  losartan-hydrochlorothiazide (HYZAAR) 50-12.5 MG tablet TAKE 1 TABLET BY MOUTH DAILY. 09/04/21 09/04/22  Vevelyn Francois, NP  methocarbamol (ROBAXIN) 500 MG tablet Take 1 tablet (500 mg total) by mouth 2 (two) times daily. 09/30/21   Palumbo, April, MD  oxyCODONE-acetaminophen (PERCOCET) 5-325 MG tablet Take 1 tablet by mouth every 4 (four) hours as needed for severe pain. 06/18/21 06/18/22  [name], MD  pantoprazole (PROTONIX) 20  MG tablet TAKE 1 TABLET (20 MG TOTAL) BY MOUTH DAILY. 09/04/21 09/04/22  Vevelyn Francois, NP  sildenafil (VIAGRA) 100 MG tablet Take 1 tablet (100 mg total) by mouth daily as needed for erectile dysfunction. 08/01/21   Gifford Shave, MD  tamsulosin (FLOMAX) 0.4 MG CAPS capsule take 1 capsule by mouth once daily 07/09/21       Family History Family History  Problem Relation Age of Onset   Cancer Father        prostate    Colon cancer Father 93   Cancer Maternal Grandfather        prostate   Colon cancer Maternal Uncle    Colon cancer Paternal Grandfather    Esophageal cancer Neg Hx    Rectal cancer Neg Hx    Stomach cancer Neg Hx     Social History Social History   Tobacco Use   Smoking status: Former    Years: 10.00    Types: Cigarettes    Quit date: 11/20/2015    Years since quitting: 5.8   Smokeless tobacco: Never  Vaping Use   Vaping Use: Never used  Substance Use Topics   Alcohol use: No   Drug use: No     Allergies   Patient has no known allergies.   Review of Systems Review of Systems  Genitourinary:  Positive for dysuria and hematuria.  All other systems reviewed and are negative.   Physical Exam Triage Vital Signs ED Triage Vitals  Enc Vitals Group     BP 10/07/21 1403 (!) 153/93     Pulse Rate 10/07/21 1403 87     Resp 10/07/21 1403 20     Temp 10/07/21 1403 98.2 F (36.8 C)     Temp Source 10/07/21 1403 Oral     SpO2 10/07/21 1403 95 %     Weight --      Height --      Head Circumference --      Peak Flow --      Pain Score 10/07/21 1408 0     Pain Loc --      Pain Edu? --      Excl. in [place]? --    No data found.  Updated Vital Signs BP (!) 153/93 (BP Location: Left Arm)   Pulse 87   Temp 98.2 F (36.8 C) (Oral)   Resp 20   SpO2 95%   Visual Acuity Right Eye Distance:   Left Eye Distance:   Bilateral Distance:    Right Eye Near:   Left Eye Near:    Bilateral Near:     Physical Exam Vitals and nursing note reviewed.   Constitutional:      General: He is not in acute distress.    Appearance: Normal appearance. He is not ill-appearing, toxic-appearing or diaphoretic.  HENT:     Head: Normocephalic and atraumatic.  Eyes:  Conjunctiva/sclera: Conjunctivae normal.  Cardiovascular:     Rate and Rhythm: Normal rate.     Pulses: Normal pulses.  Pulmonary:     Effort: Pulmonary effort is normal.  Abdominal:     General: Abdomen is flat.  Genitourinary:    Comments: declines Musculoskeletal:        General: Normal range of motion.     Cervical back: Normal range of motion.  Skin:    General: Skin is warm and dry.  Neurological:     General: No focal deficit present.     Mental Status: He is alert and oriented to person, place, and time.  Psychiatric:        Mood and Affect: Mood normal.     UC Treatments / Results  Labs (all labs ordered are listed, but only abnormal results are displayed) Labs Reviewed  POCT URINALYSIS DIPSTICK, ED / UC - Abnormal; Notable for the following components:      Result Value   Ketones, ur TRACE (*)    Hgb urine dipstick LARGE (*)    Protein, ur 100 (*)    Leukocytes,Ua TRACE (*)    All other components within normal limits  URINE CULTURE  POCT URINALYSIS DIPSTICK, ED / UC    EKG   Radiology No results found.  Procedures Procedures (including critical care time)  Medications Ordered in UC Medications - No data to display  Initial Impression / Assessment and Plan / UC Course  I have reviewed the triage vital signs and the nursing notes.  Pertinent labs & imaging results that were available during my care of the patient were reviewed by me and considered in my medical decision making (see chart for details).    Assessment negative for red flags or concerns.  Urinalysis positive for ketones, hemoglobin, protein, and leukocytes.  Urine culture pending.  Due to patient's history and symptoms we will go ahead and treat for UTI with Keflex twice daily  for the next 7 days.  Tylenol and/or ibuprofen as needed.  Encourage fluids and rest.  Follow-up with hematologist for reevaluation of soon as possible. Final Clinical Impressions(s) / UC Diagnoses   Final diagnoses:  Lower urinary tract infectious disease  Hematuria, unspecified type     Discharge Instructions      Take the Keflex twice a day for the next 7 days.    You can take Tylenol and/or Ibuprofen as needed for pain relief and fever reduction.   Make sure you are drinking plenty of fluids, especially water.  You can drink cranberry juice to help with symptom relief, but make sure it is cranberry juice and not cranberry cocktail.  You can also try AZO, cranberry pills, or pyridium as needed.    Return or go to the Emergency Department if symptoms worsen or do not improve in the next few days.  Follow up with your cancer doctor for re-evaluation as soon as possible.      ED Prescriptions     Medication Sig Dispense Auth. Provider   cephALEXin (KEFLEX) 500 MG capsule Take 1 capsule (500 mg total) by mouth 2 (two) times daily for 7 days. 14 capsule Pearson Forster, NP      PDMP not reviewed this encounter.   Pearson Forster, NP 10/07/21 [phone]

## 2021-10-07 NOTE — ED Triage Notes (Signed)
Pt presents with blood in urine since waking up this morning.   Pt has Hx of prostate cancer, Hx of right kidney ; had stent placed 2022

## 2021-10-08 ENCOUNTER — Encounter: Payer: Self-pay | Admitting: Gastroenterology

## 2021-10-08 LAB — URINE CULTURE: Culture: NO GROWTH

## 2021-10-17 ENCOUNTER — Ambulatory Visit: Payer: Medicaid Other | Admitting: Nurse Practitioner

## 2021-11-11 ENCOUNTER — Other Ambulatory Visit: Payer: Self-pay

## 2021-11-14 ENCOUNTER — Other Ambulatory Visit: Payer: Self-pay

## 2021-11-23 IMAGING — MR MR LUMBAR SPINE W/O CM
4 of 5 series · 27 of 48 positions shown · non-contrast
Comparison: CT abdomen and pelvis 06/10/2021. Nuclear medicine
whole-body bone scan 04/26/2021.

CLINICAL DATA: Low back pain, right leg weakness and tingling,
difficulty walking, and saddle anesthesia for 4 days. History of
metastatic prostate cancer.

EXAM:
MRI LUMBAR SPINE WITHOUT CONTRAST
TECHNIQUE: Multiplanar, multisequence MR imaging of the lumbar spine was
performed. No intravenous contrast was administered.

[Series 5: T2 · sagittal · 4.0mm · 0.73mm/px · 6 of 15 slices shown (1 of 2)]
[im 1/15]
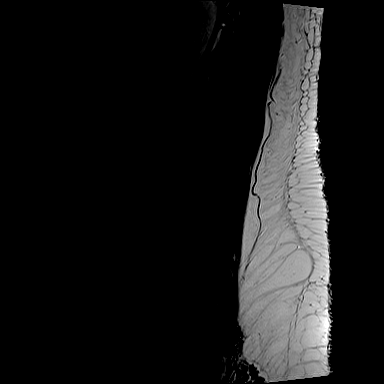
[im 3/15]
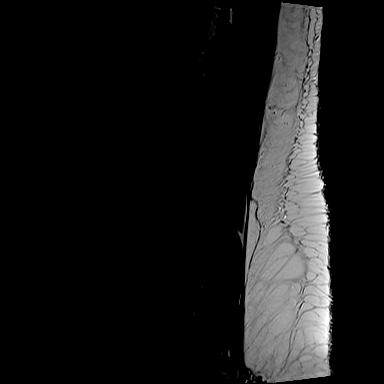
[im 6/15]
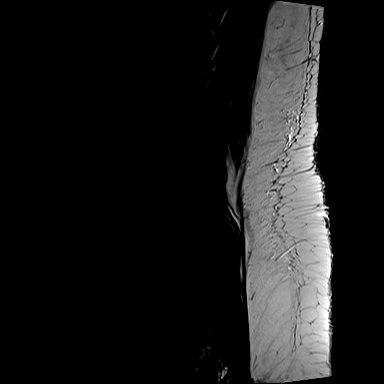
[im 9/15]
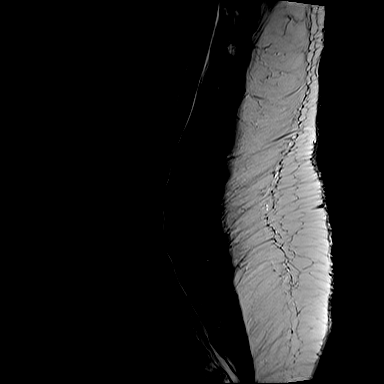
[im 12/15]
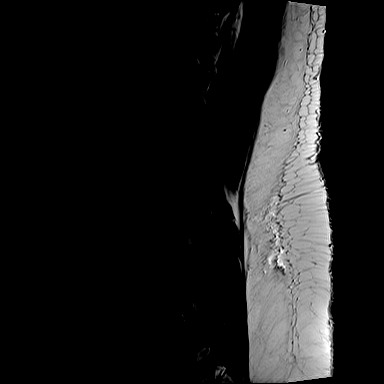
[im 15/15]
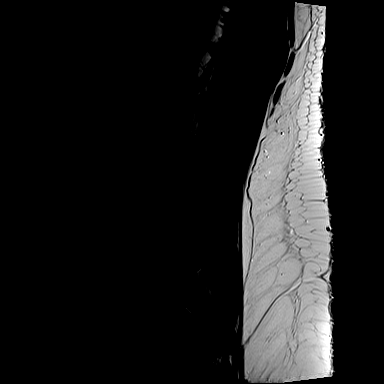

[Series 7: T1 · sagittal · 4.0mm · 0.88mm/px · 7 of 15 slices shown (1 of 2)]
[im 1/15]
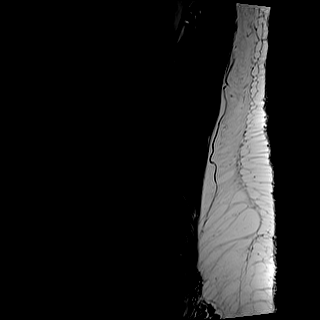
[im 3/15]
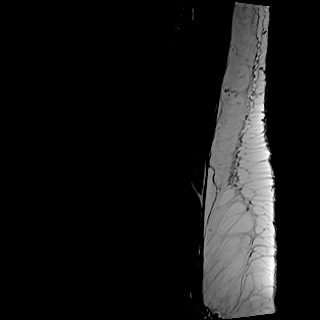
[im 5/15]
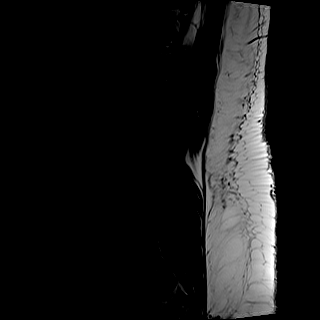
[im 8/15]
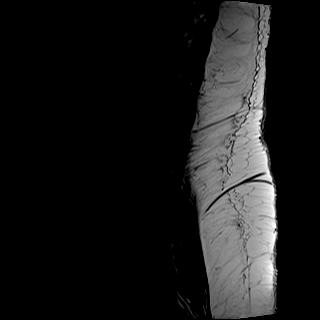
[im 10/15]
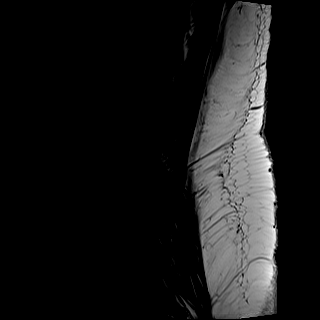
[im 12/15]
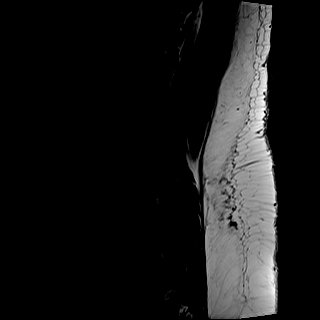
[im 15/15]
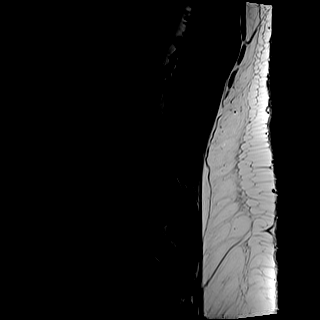

[Series 8: T2 · axial · 4.0mm · 0.62mm/px · z∈[-105,+86]mm · 8 of 32 slices shown (2 of 2)]
[im 1/32]
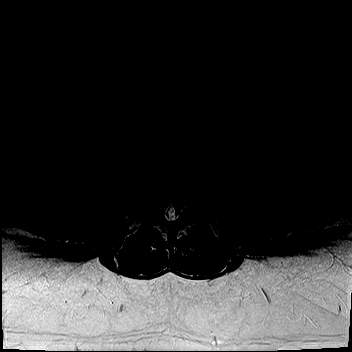
[im 5/32]
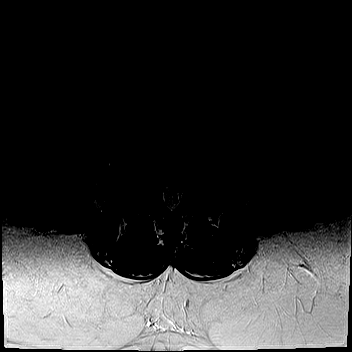
[im 10/32]
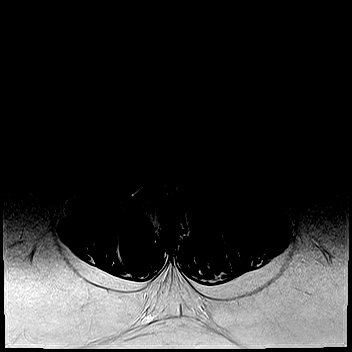
[im 15/32]
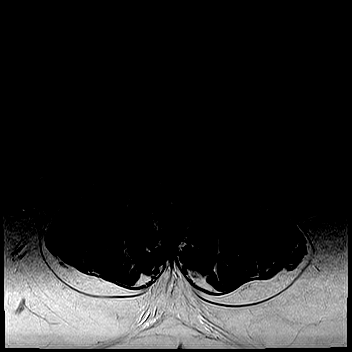
[im 17/32]
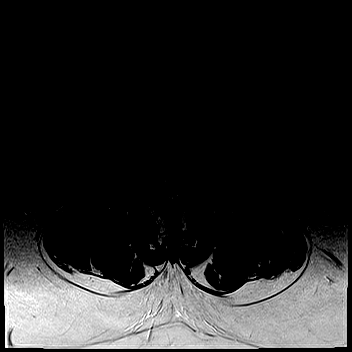
[im 22/32]
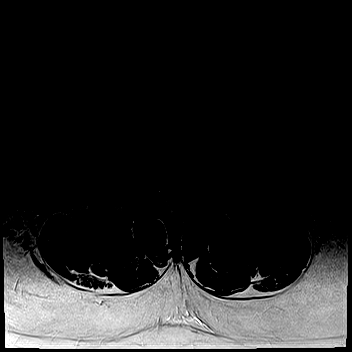
[im 27/32]
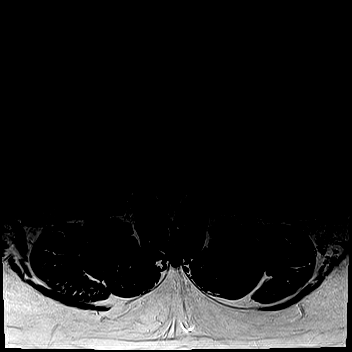
[im 32/32]
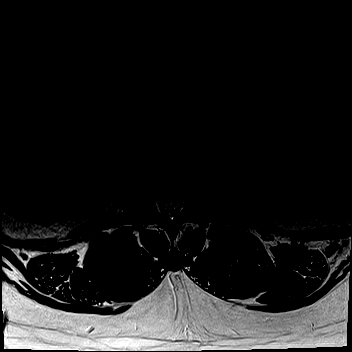

[Series 9: T1 · axial · 4.0mm · 0.36mm/px · z∈[-105,+61]mm · 6 of 32 slices shown (2 of 2)]
[im 1/32]
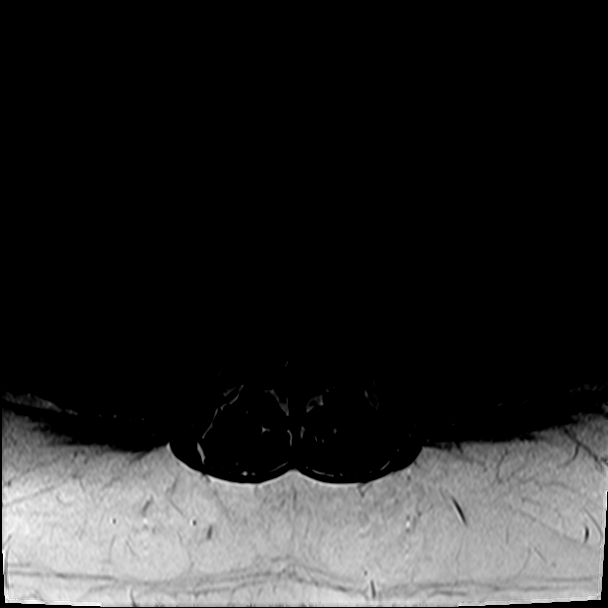
[im 5/32]
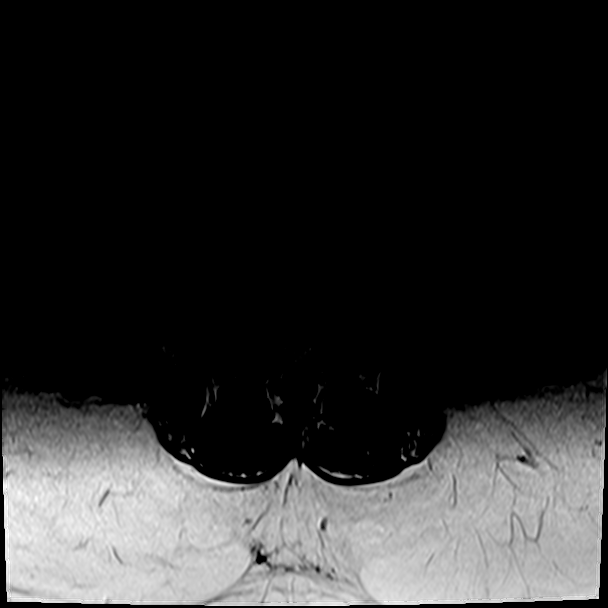
[im 10/32]
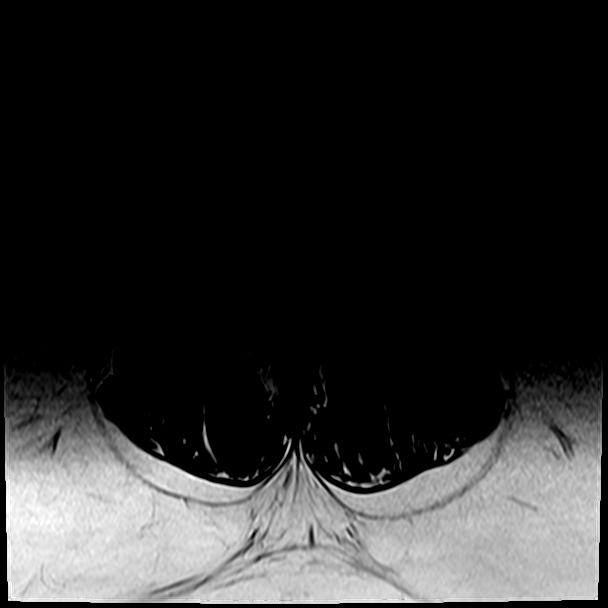
[im 15/32]
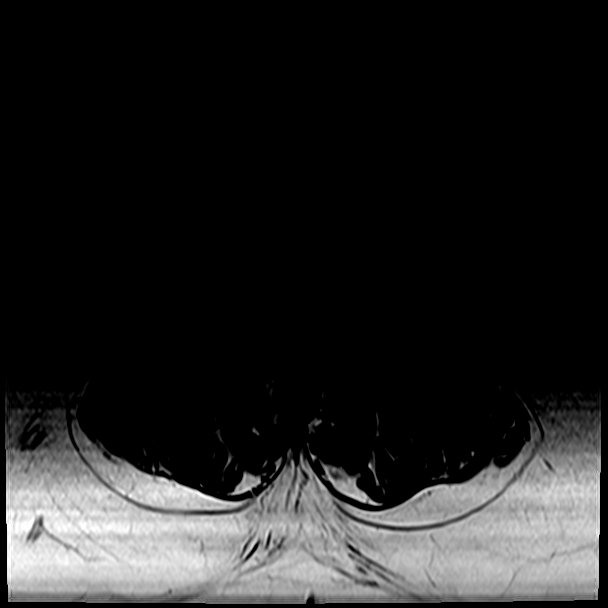
[im 17/32]
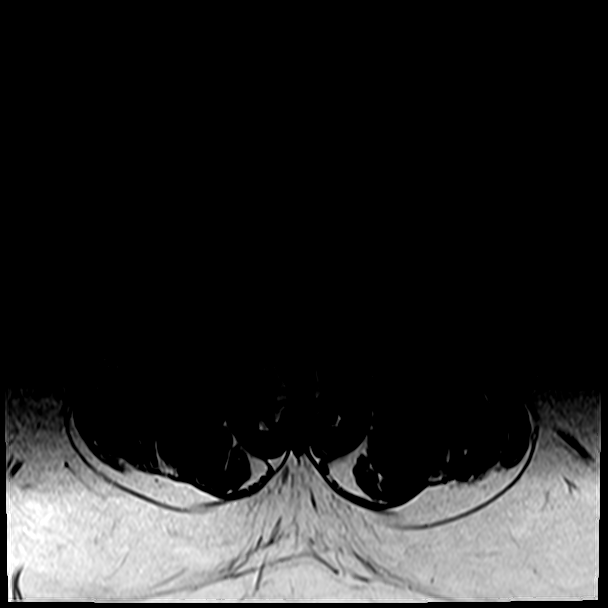
[im 27/32]
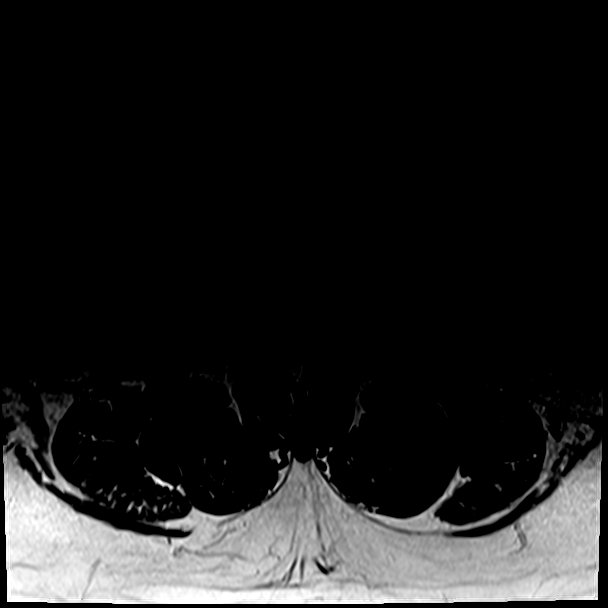

[27 of 48 positions shown; findings below may reference images not displayed]

FINDINGS: Segmentation:  Standard.

Alignment:  Normal.

Vertebrae: No fracture, suspicious marrow lesion, or significant
marrow edema. No evidence of epidural tumor. Small hemangioma in the
S3 segment.

Conus medullaris and cauda equina: Conus extends to the L1 level.
Conus and cauda equina appear normal.

Paraspinal and other soft tissues: Retroperitoneal lymphadenopathy
as described on today's CT of the abdomen and pelvis.

Disc levels:

Disc desiccation from L2-3 to L5-S1. Minimal disc space narrowing at
L4-5 and L5-S1.

L1-2: Negative.

L2-3: Mild disc bulging without stenosis.

L3-4: Disc bulging and mild facet and ligamentum flavum hypertrophy
result in mild right and mild-to-moderate left neural foraminal
stenosis without spinal stenosis.

L4-5: Disc bulging and moderate facet hypertrophy result in mild
bilateral lateral recess stenosis and mild-to-moderate bilateral
neural foraminal stenosis. Prominent dorsal epidural fat contributes
to borderline spinal stenosis.

L5-S1: Disc bulging and mild facet hypertrophy result in
mild-to-moderate left greater than right neural foraminal stenosis
without spinal stenosis.
IMPRESSION: 1. No evidence of metastatic disease in the lumbar spine.
2. Lumbar disc and facet degeneration with mild-to-moderate neural
foraminal stenosis at L3-4, L4-5, and L5-S1.
3. Mild lateral recess stenosis at L4-5.
4. Bulky retroperitoneal lymphadenopathy, more fully evaluated on
today's earlier CT.

## 2021-11-29 ENCOUNTER — Other Ambulatory Visit: Payer: Self-pay

## 2021-12-16 ENCOUNTER — Other Ambulatory Visit: Payer: Self-pay

## 2022-01-21 ENCOUNTER — Other Ambulatory Visit: Payer: Self-pay

## 2022-01-22 ENCOUNTER — Other Ambulatory Visit: Payer: Self-pay

## 2022-01-29 ENCOUNTER — Other Ambulatory Visit: Payer: Self-pay

## 2022-01-29 ENCOUNTER — Encounter: Payer: Self-pay | Admitting: Nurse Practitioner

## 2022-01-29 ENCOUNTER — Ambulatory Visit (INDEPENDENT_AMBULATORY_CARE_PROVIDER_SITE_OTHER): Payer: BLUE CROSS/BLUE SHIELD | Admitting: Nurse Practitioner

## 2022-01-29 VITALS — BP 158/84 | HR 74 | Temp 97.7°F | Ht 70.0 in | Wt 290.4 lb

## 2022-01-29 DIAGNOSIS — Z0000 Encounter for general adult medical examination without abnormal findings: Secondary | ICD-10-CM | POA: Diagnosis not present

## 2022-01-29 DIAGNOSIS — R7303 Prediabetes: Secondary | ICD-10-CM | POA: Diagnosis not present

## 2022-01-29 DIAGNOSIS — Z13220 Encounter for screening for lipoid disorders: Secondary | ICD-10-CM

## 2022-01-29 DIAGNOSIS — K635 Polyp of colon: Secondary | ICD-10-CM

## 2022-01-29 DIAGNOSIS — M5136 Other intervertebral disc degeneration, lumbar region: Secondary | ICD-10-CM

## 2022-01-29 DIAGNOSIS — M48061 Spinal stenosis, lumbar region without neurogenic claudication: Secondary | ICD-10-CM | POA: Diagnosis not present

## 2022-01-29 DIAGNOSIS — D568 Other thalassemias: Secondary | ICD-10-CM

## 2022-01-29 DIAGNOSIS — Z6841 Body Mass Index (BMI) 40.0 and over, adult: Secondary | ICD-10-CM

## 2022-01-29 DIAGNOSIS — M51369 Other intervertebral disc degeneration, lumbar region without mention of lumbar back pain or lower extremity pain: Secondary | ICD-10-CM

## 2022-01-29 DIAGNOSIS — I10 Essential (primary) hypertension: Secondary | ICD-10-CM | POA: Diagnosis not present

## 2022-01-29 DIAGNOSIS — C61 Malignant neoplasm of prostate: Secondary | ICD-10-CM

## 2022-01-29 LAB — POCT GLYCOSYLATED HEMOGLOBIN (HGB A1C)
HbA1c POC (<> result, manual entry): 6 % (ref 4.0–5.6)
HbA1c, POC (controlled diabetic range): 6 % (ref 0.0–7.0)
HbA1c, POC (prediabetic range): 6 % (ref 5.7–6.4)
Hemoglobin A1C: 6 % — AB (ref 4.0–5.6)

## 2022-01-29 LAB — GLUCOSE, POCT (MANUAL RESULT ENTRY): POC Glucose: 100 mg/dl — AB (ref 70–99)

## 2022-01-29 MED ORDER — PANTOPRAZOLE SODIUM 20 MG PO TBEC
DELAYED_RELEASE_TABLET | Freq: Every day | ORAL | 3 refills | Status: AC
  Filled 2022-01-29: qty 30, 30d supply, fill #0
  Filled 2022-01-31: qty 90, 90d supply, fill #0

## 2022-01-29 MED ORDER — LIDOCAINE 5 % EX PTCH
1.0000 | MEDICATED_PATCH | CUTANEOUS | 0 refills | Status: DC
  Filled 2022-01-29 – 2022-10-28 (×2): qty 30, 30d supply, fill #0

## 2022-01-29 MED ORDER — METHOCARBAMOL 500 MG PO TABS
500.0000 mg | ORAL_TABLET | Freq: Two times a day (BID) | ORAL | 0 refills | Status: DC
  Filled 2022-01-29: qty 30, 15d supply, fill #0

## 2022-01-29 NOTE — Patient Instructions (Signed)
Spinal Stenosis Spinal stenosis happens when the spinal canal gets smaller. The spinal canal is the space between the bones of your spine (vertebrae). As the canal gets smaller, the nerves that pass that part of the spine are pressed. This causes pain. Spinal stenosis can affect your neck, upper back, or lower back. What are the causes? This condition is caused by parts of bone that push into your spinal canal. This problem may start before birth. If it occurs after birth, the cause may be: Breakdown of bones of your spine. This normally starts around 57 years of age. Injury to your spine. Tumors in your spine. A buildup of calcium in your spine. What increases the risk? You are more likely to develop this condition if: You are older than age 55. You were born with a problem in your spine, such as a curved spine (scoliosis). You have arthritis. This is disease of your joints. What are the signs or symptoms? Common symptoms of this condition include: Pain in your neck or back. The pain may be worse when you stand or walk. Problems with your legs. A leg may lose feeling, tingle, or turn hot or cold. Pain that goes from your butt down to your lower leg (sciatica). Falling a lot. Slapping your foot down when you walk. This weakens muscles. Severe symptoms of this condition include: Problems pooping or peeing. Trouble having sex. Loss of feeling in your leg. Being unable to walk. Sometimes there are no symptoms. How is this treated? To treat pain and manage symptoms, you may be asked to: Practice sitting and standing up straight. This is good posture. Exercise. Lose weight, if needed. Take medicines or get shots. Support your back with a corset or a brace. In some cases, you may need to have surgery. Follow these instructions at home: Managing pain, stiffness, and swelling  Stand and sit up straight. If you have a brace or a corset, wear it as told. Keep a healthy weight. Talk with  your doctor if you need help losing weight. If told, put heat on the affected area. Do this as often as told by your doctor. Use the heat source that your doctor recommends, such as a moist heat pack or a heating pad. Place a towel between your skin and the heat source. Leave the heat on for 20-30 minutes. Take off the heat if your skin turns bright red. This is very important if you are unable to feel pain, heat, or cold. You have a greater risk of getting burned. Activity Do exercises as told by your doctor. Do not do activities that cause pain. Ask your doctor what activities are safe for you. Do not lift anything that is heavier than 10 lb (4.5 kg), or the limit that you are told by your doctor. Return to your normal activities as told by your doctor. Ask your doctor what activities are safe for you. General instructions Take over-the-counter and prescription medicines only as told by your doctor. Do not use any products that contain nicotine or tobacco, such as cigarettes, e-cigarettes, and chewing tobacco. If you need help quitting, ask your doctor. Eat a healthy diet. Eat a lot of: Fruits. Vegetables. Whole grains. Low-fat (lean) protein. Keep all follow-up visits as told by your doctor. This is important. Contact a doctor if: Your symptoms do not get better. Your symptoms get worse. You have a fever. Get help right away if: You have new pain or very bad pain in your neck or  upper back. You have very bad pain, and medicine does not help. You have a very bad headache. You are dizzy. You do not see well. You vomit or feel like you may vomit. You have these things in your back or legs: New or worse loss of feeling. New or worse tingling. Your arm or leg: Hurts or swells. Turns red. Feels warm. Summary Spinal stenosis happens when the space between the bones of the spine gets smaller. The small space puts pressure on the nerves in your spine. This condition may start before  birth. Breakdown of bones, injuries, or tumors can cause this condition after birth. Spinal stenosis can cause pain in the neck, back, legs, or butt. A leg may lose feeling, tingle, or turn hot or cold. Treatment for this condition focuses on lessening your pain and other symptoms. This information is not intended to replace advice given to you by your health care provider. Make sure you discuss any questions you have with your health care provider. Document Revised: 10/13/2019 Document Reviewed: 10/13/2019 Elsevier Patient Education  2022 [employer].

## 2022-01-29 NOTE — Progress Notes (Signed)
[place], [place]  [address] Phone:  [phone]   Fax:  [phone]   Established Patient Office Visit  Subjective:  Patient ID: [name], male    DOB: [birth year]  Age: 56 y.o. MRN: [id number]  CC:  Chief Complaint  Patient presents with   Follow-up    Pt is here today for his 3 month follow up visit. Pt is having lower back pains off and on x 2 weeks. Pt is also having left ear pain x 1 week.    HPI [name] presents for follow up. He  has a past medical history of Arthritis, Chronic bilateral low back pain with right-sided sciatica, ED (erectile dysfunction), GERD (gastroesophageal reflux disease), Hydronephrosis of right kidney, Hypertension, Pre-diabetes, Prostate cancer ([place]), and Wears glasses.   [name] is in today for follow up for Hypertension. The current prescribed treatment is losartan HCT 50/12.5 mg daily. Compliance is reported and home blood pressure monitoring is/done with a range of 130s 140s over 70s to 80s. The adherence to DASH diet is being followed. An exercise regimen is ongoing; a little walking. There is a goal to increase slowly and lose weight. Denies headache, dizziness, visual changes, shortness of breath, dyspnea on exertion, chest pain, nausea, vomiting or any edema.   He has as history for low back pain.  He has been evaluated with x-rays and MRI.  He has a history of prostate history of prostate cancer.  The MRI.  The MRI indicates no repeat MRI indicates no metastatic disease he has several bulging disc along with spinal stenosis lumbar facet disorder. The right leg to mid thigh which incorporates the whole thigh. He has some weakness to the right leg.Marland Kitchen He denies any numbness and tingling in the leg. APAP with some relief prn.    Past Medical History:  Diagnosis Date   Arthritis    Chronic bilateral low back pain with right-sided sciatica    ED (erectile dysfunction)    GERD  (gastroesophageal reflux disease)    Hydronephrosis of right kidney    Hypertension    followed by pcp   (nuclear test study 02-28-2010 in epic, normal no ischemia, nuclear ef 51%)   Pre-diabetes    Prostate cancer ([place])    urologist--dr newsome   -- dx 04/ 2022,   started ADT 06-14-2021   Wears glasses     Past Surgical History:  Procedure Laterality Date   COLONOSCOPY  02/04/2006   Dr.Jacobs   CYSTOSCOPY W/ URETERAL STENT PLACEMENT Bilateral 06/18/2021   Procedure: CYSTOSCOPY WITH BILATERAL RETROGRADE PYELOGRAM/ RIGHT URETERAL STENT PLACEMENT;  Surgeon: [name], MD;  Location: [facility];  Service: Urology;  Laterality: Bilateral;   LAPAROSCOPIC CHOLECYSTECTOMY  01/20/2008   @ [place]   TOTAL KNEE ARTHROPLASTY Right 07/16/2018   Procedure: RIGHT TOTAL KNEE REPLACEMENT;  Surgeon: Marybelle Killings, MD;  Location: [place];  Service: Orthopedics;  Laterality: Right;    Family History  Problem Relation Age of Onset   Cancer Father        prostate    Colon cancer Father 50   Cancer Maternal Grandfather        prostate   Colon cancer Maternal Uncle    Colon cancer Paternal Grandfather    Esophageal cancer Neg Hx    Rectal cancer Neg Hx    Stomach cancer Neg Hx     Social History   Socioeconomic History  Marital status: Married    Spouse name: Not on file   Number of children: Not on file   Years of education: Not on file   Highest education level: Not on file  Occupational History   Not on file  Tobacco Use   Smoking status: Former    Years: 10.00    Types: Cigarettes    Quit date: 11/20/2015    Years since quitting: 6.2   Smokeless tobacco: Never  Vaping Use   Vaping Use: Never used  Substance and Sexual Activity   Alcohol use: No   Drug use: No   Sexual activity: Yes    Birth control/protection: None  Other Topics Concern   Not on file  Social History Narrative   Not on file   Social Determinants of Health   Financial Resource Strain:  Not on file  Food Insecurity: Not on file  Transportation Needs: Not on file  Physical Activity: Not on file  Stress: Not on file  Social Connections: Not on file  Intimate Partner Violence: Not on file    Outpatient Medications Prior to Visit  Medication Sig Dispense Refill   acetaminophen (TYLENOL) 500 MG tablet Take 500 mg by mouth every 6 (six) hours as needed for moderate pain or headache.     aspirin EC 325 MG EC tablet Take 1 tablet (325 mg total) by mouth daily with breakfast. 30 tablet 0   sildenafil (VIAGRA) 100 MG tablet Take 1 tablet (100 mg total) by mouth daily as needed for erectile dysfunction. 10 tablet 3   XTANDI 40 MG capsule Take 160 mg by mouth daily.     ibuprofen (ADVIL) 200 MG tablet Take 200 mg by mouth every 6 (six) hours as needed for headache or moderate pain. (Patient not taking: Reported on 01/29/2022)     losartan-hydrochlorothiazide (HYZAAR) 50-12.5 MG tablet TAKE 1 TABLET BY MOUTH DAILY. (Patient not taking: Reported on 01/29/2022) 90 tablet 3   tamsulosin (FLOMAX) 0.4 MG CAPS capsule Take 1 capsule by mouth once daily (Patient not taking: Reported on 01/29/2022) 30 capsule 11   HYDROcodone-acetaminophen (NORCO/VICODIN) 5-325 MG tablet Take 1 tablet by mouth every 6 (six) hours as needed. (Patient not taking: Reported on 01/29/2022) 10 tablet 0   lidocaine (LIDODERM) 5 % Place 1 patch onto the skin daily. Remove & Discard patch within 12 hours or as directed by MD (Patient not taking: Reported on 01/29/2022) 30 patch 0   methocarbamol (ROBAXIN) 500 MG tablet Take 1 tablet (500 mg total) by mouth 2 (two) times daily. (Patient not taking: Reported on 01/29/2022) 20 tablet 0   oxyCODONE-acetaminophen (PERCOCET) 5-325 MG tablet Take 1 tablet by mouth every 4 (four) hours as needed for severe pain. (Patient not taking: Reported on 01/29/2022) 20 tablet 0   pantoprazole (PROTONIX) 20 MG tablet TAKE 1 TABLET (20 MG TOTAL) BY MOUTH DAILY. (Patient not taking: Reported on 01/29/2022) 90  tablet 3   No facility-administered medications prior to visit.    No Known Allergies  ROS Review of Systems    Objective:    Physical Exam Constitutional:      Appearance: He is obese.  HENT:     Head: Normocephalic and atraumatic.     Right Ear: Tympanic membrane normal.     Left Ear: Tympanic membrane normal.     Ears:     Comments: Small amount dry cerumen  Cardiovascular:     Rate and Rhythm: Normal rate and regular rhythm.  Pulses: Normal pulses.     Heart sounds: Normal heart sounds.  Pulmonary:     Effort: Pulmonary effort is normal.     Comments: Diminished Abdominal:     Comments: Increased abdominal girth   Musculoskeletal:        General: Tenderness present.     Cervical back: Normal range of motion.     Right lower leg: No edema.     Left lower leg: No edema.  Skin:    General: Skin is warm.     Capillary Refill: Capillary refill takes less than 2 seconds.  Neurological:     General: No focal deficit present.     Mental Status: He is alert and oriented to person, place, and time.  Psychiatric:        Mood and Affect: Mood normal.        Behavior: Behavior normal.        Thought Content: Thought content normal.        Judgment: Judgment normal.    BP (!) 158/84    Pulse 74    Temp 97.7 F (36.5 C)    Ht _0  (1.778 m)    Wt 290 lb 6.4 oz (131.7 kg)    SpO2 97%    BMI 41.67 kg/m  Wt Readings from Last 3 Encounters:  01/29/22 290 lb 6.4 oz (131.7 kg)  08/01/21 290 lb (131.5 kg)  06/18/21 289 lb 6.4 oz (131.3 kg)     Health Maintenance Due  Topic Date Due   Zoster Vaccines- Shingrix (1 of 2) Never done   COLONOSCOPY (Pts 45-4yr Insurance coverage will need to be confirmed)  11/19/2021    There are no preventive care reminders to display for this patient.  Lab Results  Component Value Date   TSH 1.683 Test methodology is 3rd generation TSH 02/26/2010   Lab Results  Component Value Date   WBC 12.9 (H) 06/10/2021   HGB 15.6  06/18/2021   HCT 46.0 06/18/2021   MCV 85.9 06/10/2021   PLT 359 06/10/2021   Lab Results  Component Value Date   NA 138 06/18/2021   K 4.7 06/18/2021   CO2 26 06/10/2021   GLUCOSE 107 (H) 06/18/2021   BUN 21 (H) 06/18/2021   CREATININE 1.10 06/18/2021   BILITOT 1.6 (H) 06/10/2021   ALKPHOS 60 06/10/2021   AST 44 (H) 06/10/2021   ALT 27 06/10/2021   PROT 7.3 06/10/2021   ALBUMIN 3.3 (L) 06/10/2021   CALCIUM 9.2 06/10/2021   ANIONGAP 8 06/10/2021   EGFR 82 04/24/2021   Lab Results  Component Value Date   CHOL 131 08/29/2020   Lab Results  Component Value Date   HDL 58 08/29/2020   Lab Results  Component Value Date   LDLCALC 55 08/29/2020   Lab Results  Component Value Date   TRIG 97 08/29/2020   Lab Results  Component Value Date   CHOLHDL 2.3 08/29/2020   Lab Results  Component Value Date   HGBA1C 6.0 (A) 01/29/2022   HGBA1C 6.0 01/29/2022   HGBA1C 6.0 01/29/2022   HGBA1C 6.0 01/29/2022      Assessment & Plan:   Problem List Items Addressed This Visit       Cardiovascular and Mediastinum   Essential hypertension - Primary   Relevant Orders   Comp. Metabolic Panel (12)   POCT glycosylated hemoglobin (Hb A1C) (Completed)     Genitourinary   Prostate cancer (HCC)   Relevant Medications  XTANDI 40 MG capsule     Other   Prediabetes   Class 3 severe obesity due to excess calories with serious comorbidity and body mass index (BMI) of 40.0 to 44.9 in adult [facility])   A gamma beta+ HPFH and beta 0 thalassemia in [facility])   Other Visit Diagnoses     Polyp of descending colon, unspecified type       Relevant Orders   Ambulatory referral to Gastroenterology   Spinal stenosis of lumbar region, unspecified whether neurogenic claudication present       Screening for cholesterol level       Relevant Orders   Lipid panel   Health care maintenance       Relevant Orders   POCT glucose (manual entry) (Completed)   Bulging lumbar disc           Meds  ordered this encounter  Medications   pantoprazole (PROTONIX) 20 MG tablet    Sig: TAKE 1 TABLET (20 MG TOTAL) BY MOUTH DAILY.    Dispense:  90 tablet    Refill:  3    Order Specific Question:   Supervising Provider    Answer:   Tresa Garter [[id number]]   methocarbamol (ROBAXIN) 500 MG tablet    Sig: Take 1 tablet (500 mg total) by mouth 2 (two) times daily.    Dispense:  30 tablet    Refill:  0    Order Specific Question:   Supervising Provider    Answer:   Tresa Garter [[id number]]   lidocaine (LIDODERM) 5 %    Sig: Place 1 patch onto the skin daily. Remove & Discard patch within 12 hours or as directed by MD    Dispense:  30 patch    Refill:  0    Order Specific Question:   Supervising Provider    Answer:   Tresa Garter [[id number]]    Follow-up: No follow-ups on file.    Vevelyn Francois, NP

## 2022-01-30 ENCOUNTER — Other Ambulatory Visit: Payer: Self-pay

## 2022-01-30 ENCOUNTER — Encounter: Payer: Self-pay | Admitting: Nurse Practitioner

## 2022-02-03 ENCOUNTER — Other Ambulatory Visit: Payer: Self-pay

## 2022-02-03 ENCOUNTER — Other Ambulatory Visit: Payer: BLUE CROSS/BLUE SHIELD

## 2022-02-04 LAB — COMP. METABOLIC PANEL (12)
AST: 19 IU/L (ref 0–40)
Albumin/Globulin Ratio: 1.5 (ref 1.2–2.2)
Albumin: 4.5 g/dL (ref 3.8–4.9)
Alkaline Phosphatase: 75 IU/L (ref 44–121)
BUN/Creatinine Ratio: 12 (ref 9–20)
BUN: 9 mg/dL (ref 6–24)
Bilirubin Total: 0.3 mg/dL (ref 0.0–1.2)
Calcium: 10.5 mg/dL — ABNORMAL HIGH (ref 8.7–10.2)
Chloride: 101 mmol/L (ref 96–106)
Creatinine, Ser: 0.75 mg/dL — ABNORMAL LOW (ref 0.76–1.27)
Globulin, Total: 3.1 g/dL (ref 1.5–4.5)
Glucose: 86 mg/dL (ref 70–99)
Potassium: 4.7 mmol/L (ref 3.5–5.2)
Sodium: 141 mmol/L (ref 134–144)
Total Protein: 7.6 g/dL (ref 6.0–8.5)
eGFR: 106 mL/min/{1.73_m2} (ref >59)

## 2022-02-04 LAB — LIPID PANEL
Chol/HDL Ratio: 2.7 ratio (ref 0.0–5.0)
Cholesterol, Total: 173 mg/dL (ref 100–199)
HDL: 64 mg/dL (ref >39)
LDL Chol Calc (NIH): 76 mg/dL (ref 0–99)
Triglycerides: 204 mg/dL — ABNORMAL HIGH (ref 0–149)
VLDL Cholesterol Cal: 33 mg/dL (ref 5–40)

## 2022-02-05 ENCOUNTER — Other Ambulatory Visit: Payer: Self-pay

## 2022-02-24 ENCOUNTER — Other Ambulatory Visit: Payer: Self-pay

## 2022-02-25 ENCOUNTER — Other Ambulatory Visit: Payer: Self-pay

## 2022-03-14 ENCOUNTER — Other Ambulatory Visit: Payer: Self-pay

## 2022-03-31 ENCOUNTER — Other Ambulatory Visit: Payer: Self-pay

## 2022-04-01 ENCOUNTER — Other Ambulatory Visit: Payer: Self-pay

## 2022-04-09 ENCOUNTER — Ambulatory Visit (INDEPENDENT_AMBULATORY_CARE_PROVIDER_SITE_OTHER): Payer: BLUE CROSS/BLUE SHIELD | Admitting: Family Medicine

## 2022-04-09 ENCOUNTER — Other Ambulatory Visit: Payer: Self-pay

## 2022-04-09 ENCOUNTER — Encounter: Payer: Self-pay | Admitting: Family Medicine

## 2022-04-09 VITALS — BP 136/78 | HR 82 | Ht 70.0 in | Wt 293.0 lb

## 2022-04-09 DIAGNOSIS — M545 Low back pain, unspecified: Secondary | ICD-10-CM | POA: Diagnosis not present

## 2022-04-09 DIAGNOSIS — L821 Other seborrheic keratosis: Secondary | ICD-10-CM | POA: Diagnosis not present

## 2022-04-09 DIAGNOSIS — G8929 Other chronic pain: Secondary | ICD-10-CM

## 2022-04-09 MED ORDER — DICLOFENAC SODIUM 1 % EX GEL
2.0000 g | Freq: Four times a day (QID) | CUTANEOUS | 3 refills | Status: AC
  Filled 2022-04-09: qty 100, 12d supply, fill #0

## 2022-04-09 NOTE — Patient Instructions (Signed)
It was a pleasure seeing you today.  For your back pain I would like to get some imaging to make sure there have been no changes since she last had an MRI.  I want you to continue taking Tylenol as needed and have sent a prescription for Voltaren gel which she can apply to your back.  I have also placed a referral for physical therapy.  Someone should call you to schedule an appointment.  Regarding your dermatologic concerns I am going to send a message to our scheduling person and she can call and schedule you an appointment in our dermatology clinic.  If your pain worsens or you have any questions or concerns please feel free to call the clinic.  I hope you have a great day! ? ? ?

## 2022-04-09 NOTE — Progress Notes (Signed)
    SUBJECTIVE:   CHIEF COMPLAINT / HPI:   Low back pain  Patient presents for 10-monthhistory of low back pain.  He reports it is worse with movement and slightly improved with over-the-counter NSAIDs.  He denies any known injury and does report he has a history of disc compression and osteoarthritis of his lumbar spine which was visualized and an MRI over a year ago.  He was not experiencing back pain at that time.  Of note he does have history of prostate cancer and is being treated for this.  Seborrheic keratosis Patient with lesion on his hand which he would like removed.  Is interested in seeing a dermatology clinic  Depression Patient reports that he is doing well with his depression at this time but that 3 weeks ago he was having a rough time thinking about his cancer as well as his car broke down and he was having trouble paying his bills.  He thought about self-harm but did not have a plan of any kind.  He does not currently have a plan and denies SI or HI at this time. OBJECTIVE:   BP 136/78   Pulse 82   Ht '5\' 10"'$  (1.778 m)   Wt 293 lb (132.9 kg)   SpO2 98%   BMI 42.04 kg/m   General: Pleasant 56year old male in no acute distress Cardiac: Regular rate and rhythm, no murmurs appreciated Respiratory: Normal breathing, lungs clear to auscultation MSK: Paraspinal tenderness to palpation in the lumbar region as well as mild central tenderness which she reports he feels in the paraspinal region, full range of motion of the lumbar spine although patient does report pain with flexion, extension and rotation Psych: Pleasant, normal mood, no SI or HI at this time Derm: Lesion appreciated on right hand consistent with seborrheic keratosis   ASSESSMENT/PLAN:   Chronic low back pain without sciatica Feel that this is most likely related to osteoarthritis.  Patient does have prostate cancer and has not had any scans of his lumbar spine since the pain started.  Have ordered MRI to  evaluate for metastasis to the lumbar spine.  Patient is going to follow-up with oncology as well.  Strict ED and return precautions given and discussed red flag symptoms.  Seborrheic keratosis Message sent to scheduling to schedule in our Derm clinic for management of this.     VGifford Shave MD [place]

## 2022-04-10 ENCOUNTER — Other Ambulatory Visit: Payer: Self-pay

## 2022-04-10 DIAGNOSIS — L821 Other seborrheic keratosis: Secondary | ICD-10-CM | POA: Insufficient documentation

## 2022-04-10 DIAGNOSIS — G8929 Other chronic pain: Secondary | ICD-10-CM | POA: Insufficient documentation

## 2022-04-10 NOTE — Assessment & Plan Note (Signed)
Message sent to scheduling to schedule in our Derm clinic for management of this. ?

## 2022-04-10 NOTE — Assessment & Plan Note (Signed)
Feel that this is most likely related to osteoarthritis.  Patient does have prostate cancer and has not had any scans of his lumbar spine since the pain started.  Have ordered MRI to evaluate for metastasis to the lumbar spine.  Patient is going to follow-up with oncology as well.  Strict ED and return precautions given and discussed red flag symptoms. ?

## 2022-04-16 ENCOUNTER — Ambulatory Visit: Payer: BLUE CROSS/BLUE SHIELD | Attending: Family Medicine | Admitting: Physical Therapy

## 2022-04-16 DIAGNOSIS — G8929 Other chronic pain: Secondary | ICD-10-CM | POA: Diagnosis present

## 2022-04-16 DIAGNOSIS — M25652 Stiffness of left hip, not elsewhere classified: Secondary | ICD-10-CM | POA: Diagnosis present

## 2022-04-16 DIAGNOSIS — M545 Low back pain, unspecified: Secondary | ICD-10-CM | POA: Insufficient documentation

## 2022-04-16 DIAGNOSIS — M25651 Stiffness of right hip, not elsewhere classified: Secondary | ICD-10-CM | POA: Diagnosis present

## 2022-04-16 NOTE — Therapy (Signed)
?OUTPATIENT PHYSICAL THERAPY THORACOLUMBAR EVALUATION ? ? ?Patient Name: [name] ?MRN: [id number] ?DOB:[birth year], 56 y.o., male ?Today's Date: 04/17/2022 ? ? PT End of Session - 04/16/22 1332   ? ? Visit Number 1   ? Number of Visits 12   ? Date for PT Re-Evaluation 05/28/22   ? Authorization Type BCBS   ? PT Start Time 1330   ? PT Stop Time 0623   ? PT Time Calculation (min) 45 min   ? Activity Tolerance Patient tolerated treatment well   ? Behavior During Therapy [facility] for tasks assessed/performed   ? ?  ?  ? ?  ? ? ?Past Medical History:  ?Diagnosis Date  ? Arthritis   ? Chronic bilateral low back pain with right-sided sciatica   ? ED (erectile dysfunction)   ? GERD (gastroesophageal reflux disease)   ? Hydronephrosis of right kidney   ? Hypertension   ? followed by pcp   (nuclear test study 02-28-2010 in epic, normal no ischemia, nuclear ef 51%)  ? Pre-diabetes   ? Prostate cancer ([place])   ? urologist--dr newsome   -- dx 04/ 2022,   started ADT 06-14-2021  ? Wears glasses   ? ?Past Surgical History:  ?Procedure Laterality Date  ? COLONOSCOPY  02/04/2006  ? Dr.Jacobs  ? CYSTOSCOPY W/ URETERAL STENT PLACEMENT Bilateral 06/18/2021  ? Procedure: CYSTOSCOPY WITH BILATERAL RETROGRADE PYELOGRAM/ RIGHT URETERAL STENT PLACEMENT;  Surgeon: [name], MD;  Location: [facility];  Service: Urology;  Laterality: Bilateral;  ? LAPAROSCOPIC CHOLECYSTECTOMY  01/20/2008  ? @ [facility]  ? TOTAL KNEE ARTHROPLASTY Right 07/16/2018  ? Procedure: RIGHT TOTAL KNEE REPLACEMENT;  Surgeon: Marybelle Killings, MD;  Location: [place];  Service: Orthopedics;  Laterality: Right;  ? ?Patient Active Problem List  ? Diagnosis Date Noted  ? Seborrheic keratosis 04/10/2022  ? Chronic low back pain without sciatica 04/10/2022  ? A gamma beta+ HPFH and beta 0 thalassemia in [facility]) 01/29/2022  ? Prostate cancer ([place]) 01/29/2022  ? Encounter to establish care 08/02/2021  ? Erectile dysfunction 02/27/2020  ? Back pain without sciatica  02/27/2020  ? Class 3 severe obesity due to excess calories with serious comorbidity and body mass index (BMI) of 40.0 to 44.9 in adult [facility]) 02/27/2020  ? Status post total knee replacement, right 10/15/2018  ? Arthritis of right knee 07/16/2018  ? Prediabetes 01/02/2017  ? Essential hypertension 09/04/2016  ? ? ?PCP: Gifford Shave, MD ? ?REFERRING PROVIDER: Leeanne Rio, MD ? ?REFERRING DIAG: M54.50,G89.29 (ICD-10-CM) - Chronic low back pain without sciatica, unspecified back pain laterality  ? ?THERAPY DIAG:  ?Chronic bilateral low back pain without sciatica ? ?Stiffness of left hip, not elsewhere classified ? ?Stiffness of right hip, not elsewhere classified ? ?ONSET DATE: chronic  ? ?SUBJECTIVE:                                                                                                                                                                                          ? ?  SUBJECTIVE STATEMENT: ?Pt reports increased back pain worsened over the year.  It is hard for him to stand >5 min for ADLs, home tasks and even sitting down for a long time gets uncomfortable. He denies radicular pain but notices LLE is weaker and appears slightly swollen and that fluctuation.  At times he drags his L foot when doing a lot of standing. He reports prior work that required heavy lifting and he thinks that may have contributed.  He is no longer working. Pain also interrupts his sleep and makes it hard to lift, play with grandkids.  ? ? ?PERTINENT HISTORY:  ?Rt TKA , Prostate Cancer  ?Pre-diabetic, HTN, obesity ? ? ?PAIN:  ?Are you having pain? Yes: NPRS scale: 8/10 ?Pain location: Middle low back  ?Pain description: throbbing, aching  ?Aggravating factors: standing, walking, sitting too long  ?Relieving factors: tylenol does improve pain to < 4/10  ? ? ?PRECAUTIONS: Other: cancer ? ?WEIGHT BEARING RESTRICTIONS No ? ?FALLS:  ?Has patient fallen in last 6 months? No ? ?LIVING ENVIRONMENT: ?Lives with: lives with  their family and lives with their spouse ?Lives in: House/apartment ?Stairs: No ?Has following equipment at home: Single point cane, walker from Rt TKA ? ?OCCUPATION: retired, physical labor in the past  ? ?PLOF: Independent and Leisure: likes to be with grandkids, spouse ? ?PATIENT GOALS Pt would like to be able to somewhat pain free.  ? ? ?OBJECTIVE:  ? ?DIAGNOSTIC FINDINGS:  ?IMPRESSION: ?1. No evidence of metastatic disease in the lumbar spine. ?2. Lumbar disc and facet degeneration with mild-to-moderate neural ?foraminal stenosis at L3-4, L4-5, and L5-S1. ?3. Mild lateral recess stenosis at L4-5. ?4. Bulky retroperitoneal lymphadenopathy, more fully evaluated on ?today's earlier CT. ?  ? ?PATIENT SURVEYS:  ?FOTO 32% goal 52% ? ? ?COGNITION: ? Overall cognitive status: Within functional limits for tasks assessed   ?  ?SENSATION: ?Not tested.  Pt prediabetic.  Numbness with finger tips and bottoms of feet  ? ?MUSCLE LENGTH: ?Hamstrings: tight (50 deg) no pain in back  ?Thomas test: tight bilateral  ? ?POSTURE:  ?Stands, sits with hips abducted, external rotated , trunk flexed at hips, loss of lumbar lordosis  ? ?PALPATION: ?Grossly sore across low lumbar paraspinals and superior aspect of glutes bilateral ? ?LUMBAR ROM:  ? ?Active  A/PROM  ?04/17/2022  ?Flexion Mid shin , min pain   ?Extension 50%   ?Right lateral flexion 50% pain   ?Left lateral flexion 50% pain  ?Right rotation 50% pain   ?Left rotation 50% pain   ? (Blank rows = not tested) ? ?LE ROM: ? ?Active  Right ?04/17/2022 Left ?04/17/2022  ?Hip flexion    ?Hip extension    ?Hip abduction    ?Hip adduction    ?Hip internal rotation    ?Hip external rotation    ?Knee flexion    ?Knee extension    ?Ankle dorsiflexion    ?Ankle plantarflexion    ?Ankle inversion    ?Ankle eversion    ? (Blank rows = not tested) ? ?LE MMT: ? ?MMT Right ?04/17/2022 Left ?04/17/2022  ?Hip flexion 4+ 4  ?Hip extension 3+ 3+  ?Hip abduction 3+ 3+  ?Hip adduction    ?Hip internal  rotation    ?Hip external rotation    ?Knee flexion 5 5  ?Knee extension 5 5  ?Ankle dorsiflexion 5 4+  ?Ankle plantarflexion    ?Ankle inversion    ?Ankle eversion    ? (Blank rows =  not tested) ? ?LUMBAR SPECIAL TESTS:  ?Straight leg raise test: Negative, Slump test: Negative, Single leg stance test: NT, and Thomas test: pain in lumbar  ? ?FUNCTIONAL TESTS:  ?5 times sit to stand: 17.8 sec , light hand A on thighs  ?30 seconds chair stand test NT  ? ?GAIT: ?Distance walked: 100 ?Assistive device utilized: None ?Level of assistance: Complete Independence ?Comments: Not remarkable other than slow pace  ? ? ? ?TODAY'S TREATMENT  ?PT, POC, evaluation findings  ? ? ?PATIENT EDUCATION:  ?Education details: PT/POC, HEP  ?Person educated: Patient ?Education method: Explanation, Demonstration, and Verbal cues ?Education comprehension: verbalized understanding and needs further education ? ? ?HOME EXERCISE PROGRAM: ?Access Code: [email or web address] ?URL: [email or web address] ?Date: 04/16/2022 ?Prepared by: Raeford Razor ? ?Exercises ?- Supine Lower Trunk Rotation  - 2 x daily - 7 x weekly - 1-2 sets - 10 reps - 10-15 hold ?- Supine Piriformis Stretch with Foot on Ground  - 2 x daily - 7 x weekly - 1-2 sets - 5-10 reps - 15-30 hold ?- Seated Hamstring Stretch  - 2 x daily - 7 x weekly - 1-2 sets - 5-10 reps - 15-30 hold ?- Modified Thomas Stretch  - 1-2 x daily - 7 x weekly - 1 sets - 3 reps - 30 hold ? ?ASSESSMENT: ? ?CLINICAL IMPRESSION: ?Patient is a 56 y.o. male who was seen today for physical therapy evaluation and treatment for low back pain due to.  ? ? ?OBJECTIVE IMPAIRMENTS decreased activity tolerance, decreased endurance, decreased mobility, difficulty walking, decreased ROM, decreased strength, hypomobility, increased edema, increased fascial restrictions, impaired flexibility, impaired sensation, postural dysfunction, obesity, and pain.  ? ?ACTIVITY LIMITATIONS cleaning, community activity, meal prep, yard  work, shopping, yard work, and recreation .  ? ?PERSONAL FACTORS Time since onset of injury/illness/exacerbation and 3+ comorbidities: HTN, rt TKA , obesity   are also affecting patient's functional outcome

## 2022-04-19 ENCOUNTER — Ambulatory Visit (HOSPITAL_COMMUNITY): Payer: No Typology Code available for payment source

## 2022-04-20 NOTE — Therapy (Deleted)
OUTPATIENT PHYSICAL THERAPY TREATMENT NOTE   Patient Name: [name] MRN: [id number] DOB:09-19-1966, 56 y.o., male Today's Date: 04/20/2022  PCP: Gifford Shave, MD REFERRING PROVIDER: Gifford Shave, MD  END OF SESSION:    Past Medical History:  Diagnosis Date   Arthritis    Chronic bilateral low back pain with right-sided sciatica    ED (erectile dysfunction)    GERD (gastroesophageal reflux disease)    Hydronephrosis of right kidney    Hypertension    followed by pcp   (nuclear test study 02-28-2010 in epic, normal no ischemia, nuclear ef 51%)   Pre-diabetes    Prostate cancer ([place])    urologist--dr newsome   -- dx 04/ 2022,   started ADT 06-14-2021   Wears glasses    Past Surgical History:  Procedure Laterality Date   COLONOSCOPY  02/04/2006   Dr.Jacobs   CYSTOSCOPY W/ URETERAL STENT PLACEMENT Bilateral 06/18/2021   Procedure: CYSTOSCOPY WITH BILATERAL RETROGRADE PYELOGRAM/ RIGHT URETERAL STENT PLACEMENT;  Surgeon: [name], MD;  Location: [facility];  Service: Urology;  Laterality: Bilateral;   LAPAROSCOPIC CHOLECYSTECTOMY  01/20/2008   @ [place]   TOTAL KNEE ARTHROPLASTY Right 07/16/2018   Procedure: RIGHT TOTAL KNEE REPLACEMENT;  Surgeon: Marybelle Killings, MD;  Location: [place];  Service: Orthopedics;  Laterality: Right;   Patient Active Problem List   Diagnosis Date Noted   Seborrheic keratosis 04/10/2022   Chronic low back pain without sciatica 04/10/2022   A gamma beta+ HPFH and beta 0 thalassemia in [facility]) 01/29/2022   Prostate cancer ([place]) 01/29/2022   Encounter to establish care 08/02/2021   Erectile dysfunction 02/27/2020   Back pain without sciatica 02/27/2020   Class 3 severe obesity due to excess calories with serious comorbidity and body mass index (BMI) of 40.0 to 44.9 in adult [facility]) 02/27/2020   Status post total knee replacement, right 10/15/2018   Arthritis of right knee 07/16/2018   Prediabetes 01/02/2017    Essential hypertension 09/04/2016    REFERRING DIAG: ***  THERAPY DIAG:  No diagnosis found.  PERTINENT HISTORY: ***  PRECAUTIONS: ***  SUBJECTIVE: ***  PAIN:  Are you having pain? {OPRCPAIN:[id number]}   OBJECTIVE: (objective measures completed at initial evaluation unless otherwise dated) OBJECTIVE:    DIAGNOSTIC FINDINGS:  IMPRESSION: 1. No evidence of metastatic disease in the lumbar spine. 2. Lumbar disc and facet degeneration with mild-to-moderate neural foraminal stenosis at L3-4, L4-5, and L5-S1. 3. Mild lateral recess stenosis at L4-5. 4. Bulky retroperitoneal lymphadenopathy, more fully evaluated on today's earlier CT.     PATIENT SURVEYS:  FOTO 32% goal 52%     COGNITION:           Overall cognitive status: Within functional limits for tasks assessed                          SENSATION: Not tested.  Pt prediabetic.  Numbness with finger tips and bottoms of feet    MUSCLE LENGTH: Hamstrings: tight (50 deg) no pain in back  Thomas test: tight bilateral    POSTURE:  Stands, sits with hips abducted, external rotated , trunk flexed at hips, loss of lumbar lordosis    PALPATION: Grossly sore across low lumbar paraspinals and superior aspect of glutes bilateral   LUMBAR ROM:    Active  A/PROM  04/17/2022  Flexion Mid shin , min pain   Extension 50%   Right lateral flexion 50% pain  Left lateral flexion 50% pain  Right rotation 50% pain   Left rotation 50% pain    (Blank rows = not tested)   LE ROM:   Active  Right 04/17/2022 Left 04/17/2022  Hip flexion      Hip extension      Hip abduction      Hip adduction      Hip internal rotation      Hip external rotation      Knee flexion      Knee extension      Ankle dorsiflexion      Ankle plantarflexion      Ankle inversion      Ankle eversion       (Blank rows = not tested)   LE MMT:   MMT Right 04/17/2022 Left 04/17/2022  Hip flexion 4+ 4  Hip extension 3+ 3+  Hip abduction 3+ 3+  Hip  adduction      Hip internal rotation      Hip external rotation      Knee flexion 5 5  Knee extension 5 5  Ankle dorsiflexion 5 4+  Ankle plantarflexion      Ankle inversion      Ankle eversion       (Blank rows = not tested)   LUMBAR SPECIAL TESTS:  Straight leg raise test: Negative, Slump test: Negative, Single leg stance test: NT, and Thomas test: pain in lumbar    FUNCTIONAL TESTS:  5 times sit to stand: 17.8 sec , light hand A on thighs  30 seconds chair stand test NT    GAIT: Distance walked: 100 Assistive device utilized: None Level of assistance: Complete Independence Comments: Not remarkable other than slow pace        TODAY'S TREATMENT  PT, POC, evaluation findings      PATIENT EDUCATION:  Education details: PT/POC, HEP  Person educated: Patient Education method: Consulting civil engineer, Media planner, and Verbal cues Education comprehension: verbalized understanding and needs further education     HOME EXERCISE PROGRAM: Access Code: [email or web address] URL: [email or web address] Date: 04/16/2022 Prepared by: Raeford Razor   Exercises - Supine Lower Trunk Rotation  - 2 x daily - 7 x weekly - 1-2 sets - 10 reps - 10-15 hold - Supine Piriformis Stretch with Foot on Ground  - 2 x daily - 7 x weekly - 1-2 sets - 5-10 reps - 15-30 hold - Seated Hamstring Stretch  - 2 x daily - 7 x weekly - 1-2 sets - 5-10 reps - 15-30 hold - Modified Thomas Stretch  - 1-2 x daily - 7 x weekly - 1 sets - 3 reps - 30 hold   ASSESSMENT:   CLINICAL IMPRESSION: Patient is a 56 y.o. male who was seen today for physical therapy evaluation and treatment for low back pain due to.      OBJECTIVE IMPAIRMENTS decreased activity tolerance, decreased endurance, decreased mobility, difficulty walking, decreased ROM, decreased strength, hypomobility, increased edema, increased fascial restrictions, impaired flexibility, impaired sensation, postural dysfunction, obesity, and pain.    ACTIVITY  LIMITATIONS cleaning, community activity, meal prep, yard work, shopping, yard work, and recreation .    PERSONAL FACTORS Time since onset of injury/illness/exacerbation and 3+ comorbidities: HTN, rt TKA , obesity   are also affecting patient's functional outcome.      REHAB POTENTIAL: Excellent   CLINICAL DECISION MAKING: Stable/uncomplicated   EVALUATION COMPLEXITY: Low     GOALS: Goals reviewed with patient? Yes     LONG TERM GOALS:  Target date: 05/29/2022   Pt will be I with HEP for core, posture, mobility  Baseline: unknown Goal status: INITIAL   2.  Pt will be able to properly lift, carry items , 25 lbs without increasing back pain  Baseline: unknown Goal status: INITIAL   3.  Pt will be able to stand for 15 min for home tasks with low back pain < 5/10 most of the time.  Baseline: 5 min, pain is 7/10-8/10 Goal status: INITIAL   4.  Pt will improve trunk motion to [facility] with no pain (just stretch) to demo more mobility  Baseline: limited in all planes , pain with lateral flexion, extension  Goal status: INITIAL   5.  Pt will work towards a habit of walking 3 x per week for up to 30 min for overall health and fitness Baseline: sedentary Goal status: INITIAL       PLAN: PT FREQUENCY:  2 x per week    PT DURATION: 6 weeks   PLANNED INTERVENTIONS: Therapeutic exercises, Therapeutic activity, Neuromuscular re-education, Balance training, Gait training, Patient/Family education, Joint mobilization, Aquatic Therapy, Cryotherapy, Moist heat, Taping, Traction, and Manual therapy.   PLAN FOR NEXT SESSION: check HEP, begin and progress core, lifting mechanics    (Copy Eval's Objective through Plan section here)   Luanne Krzyzanowski, PT 04/20/2022, 9:07 AM

## 2022-04-21 ENCOUNTER — Ambulatory Visit: Payer: BLUE CROSS/BLUE SHIELD | Admitting: Physical Therapy

## 2022-04-21 ENCOUNTER — Ambulatory Visit (HOSPITAL_COMMUNITY): Payer: BLUE CROSS/BLUE SHIELD

## 2022-04-23 NOTE — Therapy (Signed)
?OUTPATIENT PHYSICAL THERAPY TREATMENT NOTE ? ? ?Patient Name: [name] ?MRN: [id number] ?DOB:1966/05/16, 56 y.o., male ?Today's Date: 04/24/2022 ? ?PCP: Gifford Shave, MD ? ?REFERRING PROVIDER: Leeanne Rio, MD ? ?END OF SESSION:  ? PT End of Session - 04/24/22 1609   ? ? Visit Number 2   ? Number of Visits 12   ? Date for PT Re-Evaluation 05/28/22   ? Authorization Type BCBS   ? PT Start Time 1515   ? PT Stop Time 3762   ? PT Time Calculation (min) 30 min   ? Activity Tolerance Patient tolerated treatment well   ? Behavior During Therapy [facility] for tasks assessed/performed   ? ?  ?  ? ?  ? ? ?Past Medical History:  ?Diagnosis Date  ? Arthritis   ? Chronic bilateral low back pain with right-sided sciatica   ? ED (erectile dysfunction)   ? GERD (gastroesophageal reflux disease)   ? Hydronephrosis of right kidney   ? Hypertension   ? followed by pcp   (nuclear test study 02-28-2010 in epic, normal no ischemia, nuclear ef 51%)  ? Pre-diabetes   ? Prostate cancer ([place])   ? urologist--dr newsome   -- dx 04/ 2022,   started ADT 06-14-2021  ? Wears glasses   ? ?Past Surgical History:  ?Procedure Laterality Date  ? COLONOSCOPY  02/04/2006  ? Dr.Jacobs  ? CYSTOSCOPY W/ URETERAL STENT PLACEMENT Bilateral 06/18/2021  ? Procedure: CYSTOSCOPY WITH BILATERAL RETROGRADE PYELOGRAM/ RIGHT URETERAL STENT PLACEMENT;  Surgeon: [name], MD;  Location: [facility];  Service: Urology;  Laterality: Bilateral;  ? LAPAROSCOPIC CHOLECYSTECTOMY  01/20/2008  ? @ [facility]  ? TOTAL KNEE ARTHROPLASTY Right 07/16/2018  ? Procedure: RIGHT TOTAL KNEE REPLACEMENT;  Surgeon: Marybelle Killings, MD;  Location: [place];  Service: Orthopedics;  Laterality: Right;  ? ?Patient Active Problem List  ? Diagnosis Date Noted  ? Seborrheic keratosis 04/10/2022  ? Chronic low back pain without sciatica 04/10/2022  ? A gamma beta+ HPFH and beta 0 thalassemia in [facility]) 01/29/2022  ? Prostate cancer ([place]) 01/29/2022  ? Encounter to  establish care 08/02/2021  ? Erectile dysfunction 02/27/2020  ? Back pain without sciatica 02/27/2020  ? Class 3 severe obesity due to excess calories with serious comorbidity and body mass index (BMI) of 40.0 to 44.9 in adult [facility]) 02/27/2020  ? Status post total knee replacement, right 10/15/2018  ? Arthritis of right knee 07/16/2018  ? Prediabetes 01/02/2017  ? Essential hypertension 09/04/2016  ? ? ?REFERRING DIAG: M54.50,G89.29 (ICD-10-CM) - Chronic low back pain without sciatica, unspecified back pain laterality  ? ?THERAPY DIAG:  ?Chronic bilateral low back pain without sciatica ? ?Stiffness of left hip, not elsewhere classified ? ?Stiffness of right hip, not elsewhere classified ? ?SUBJECTIVE:                                                                                                                                                                                          ?  ?  SUBJECTIVE STATEMENT: ?Pt reports his low back pain is the same. Pt reports some completion of his HEP since eval. ?  ? ?PAIN:  ?Are you having pain? Yes: NPRS scale: 8/10 ?Pain location: Middle low back  ?Pain description: throbbing, aching  ?Aggravating factors: standing, walking, sitting too long  ?Relieving factors: tylenol does improve pain to < 4/10  ? ?PERTINENT HISTORY:  ?Rt TKA , Prostate Cancer  ?Pre-diabetic, HTN, obesity ?  ?  ?PRECAUTIONS: Other: cancer ?  ?WEIGHT BEARING RESTRICTIONS No ?  ?FALLS:  ?Has patient fallen in last 6 months? No ?  ?LIVING ENVIRONMENT: ?Lives with: lives with their family and lives with their spouse ?Lives in: House/apartment ?Stairs: No ?Has following equipment at home: Single point cane, walker from Rt TKA ?  ?OCCUPATION: retired, physical labor in the past  ?  ?PLOF: Independent and Leisure: likes to be with grandkids, spouse ?  ?PATIENT GOALS Pt would like to be able to somewhat pain free.  ?  ?  ?OBJECTIVE:  ?  ?DIAGNOSTIC FINDINGS:  ?IMPRESSION: ?1. No evidence of metastatic disease in  the lumbar spine. ?2. Lumbar disc and facet degeneration with mild-to-moderate neural ?foraminal stenosis at L3-4, L4-5, and L5-S1. ?3. Mild lateral recess stenosis at L4-5. ?4. Bulky retroperitoneal lymphadenopathy, more fully evaluated on ?today's earlier CT. ?  ?  ?PATIENT SURVEYS:  ?FOTO 32% goal 52% ?  ?  ?COGNITION: ?          Overall cognitive status: Within functional limits for tasks assessed               ?           ?SENSATION: ?Not tested.  Pt prediabetic.  Numbness with finger tips and bottoms of feet  ?  ?MUSCLE LENGTH: ?Hamstrings: tight (50 deg) no pain in back  ?Thomas test: tight bilateral  ?  ?POSTURE:  ?Stands, sits with hips abducted, external rotated , trunk flexed at hips, loss of lumbar lordosis  ?  ?PALPATION: ?Grossly sore across low lumbar paraspinals and superior aspect of glutes bilateral ?  ?LUMBAR ROM:  ?  ?Active  A/PROM  ?04/17/2022  ?Flexion Mid shin , min pain   ?Extension 50%   ?Right lateral flexion 50% pain   ?Left lateral flexion 50% pain  ?Right rotation 50% pain   ?Left rotation 50% pain   ? (Blank rows = not tested) ?  ?LE ROM: ?  ?Active  Right ?04/17/2022 Left ?04/17/2022  ?Hip flexion      ?Hip extension      ?Hip abduction      ?Hip adduction      ?Hip internal rotation      ?Hip external rotation      ?Knee flexion      ?Knee extension      ?Ankle dorsiflexion      ?Ankle plantarflexion      ?Ankle inversion      ?Ankle eversion      ? (Blank rows = not tested) ?  ?LE MMT: ?  ?MMT Right ?04/17/2022 Left ?04/17/2022  ?Hip flexion 4+ 4  ?Hip extension 3+ 3+  ?Hip abduction 3+ 3+  ?Hip adduction      ?Hip internal rotation      ?Hip external rotation      ?Knee flexion 5 5  ?Knee extension 5 5  ?Ankle dorsiflexion 5 4+  ?Ankle plantarflexion      ?Ankle inversion      ?Ankle eversion      ? (  Blank rows = not tested) ?  ?LUMBAR SPECIAL TESTS:  ?Straight leg raise test: Negative, Slump test: Negative, Single leg stance test: NT, and Thomas test: pain in lumbar  ?  ?FUNCTIONAL  TESTS:  ?5 times sit to stand: 17.8 sec , light hand A on thighs  ?30 seconds chair stand test NT  ?  ?GAIT: ?Distance walked: 100 ?Assistive device utilized: None ?Level of assistance: Complete Independence ?Comments: Not remarkable other than slow pace  ?  ?  ?  ?TODAY'S TREATMENT  ?[facility] Adult PT Treatment:                                                DATE: 04/24/22 ?Therapeutic Exercise: ?Seated flexion forward and laterally x2 each 20" ?Seated hamstring stretch x2 20" ?LTR x5 5" ?Modified Thomas stretch x1 30" ?Piriformis stretch x2 20" ?PPT x10 3" ?Bridging x10 small range ?HEP updated ? ?Self Care: ?Discussed sleeping positions and support for comfort ? ?Initial Eval Treatment: ?PT, POC, evaluation findings  ?  ?  ?PATIENT EDUCATION:  ?Education details: PT/POC, HEP  ?Person educated: Patient ?Education method: Explanation, Demonstration, and Verbal cues ?Education comprehension: verbalized understanding and needs further education ?  ?  ?HOME EXERCISE PROGRAM: ?Access Code: [email or web address] ?URL: [email or web address] ?Date: 04/24/2022 ?Prepared by: Gar Ponto ? ?Exercises ?- Seated Flexion Stretch with Swiss Ball  - 2 x daily - 7 x weekly - 1 sets - 6 reps - 20 hold ?- Supine Lower Trunk Rotation  - 2 x daily - 7 x weekly - 1-2 sets - 10 reps - 10-15 hold ?- Supine Piriformis Stretch with Foot on Ground  - 2 x daily - 7 x weekly - 1-2 sets - 5-10 reps - 15-30 hold ?- Seated Hamstring Stretch  - 2 x daily - 7 x weekly - 1-2 sets - 5-10 reps - 15-30 hold ?- Modified Thomas Stretch  - 1-2 x daily - 7 x weekly - 1 sets - 3 reps - 30 hold ?- Supine Posterior Pelvic Tilt  - 1 x daily - 7 x weekly - 2 sets - 10 reps - 3 hold ?- Supine Bridge  - 1 x daily - 7 x weekly - 2 sets - 10 reps - 3 hold ?  ?ASSESSMENT: ?  ?CLINICAL IMPRESSION: ?PT was completed for lumbopelvic flexibility and strengthening. Pt returned proper demonstration of the therex. Therex were made part of the pt's HEP. Following the session,  pt reported his low back felt better, rating a 6/10. Pt will continue to benefit from skilled PT to address impairments to optimize function with less pain. ? ?OBJECTIVE IMPAIRMENTS decreased activity tolerance,

## 2022-04-24 ENCOUNTER — Ambulatory Visit: Payer: BLUE CROSS/BLUE SHIELD

## 2022-04-24 DIAGNOSIS — M545 Low back pain, unspecified: Secondary | ICD-10-CM | POA: Diagnosis not present

## 2022-04-24 DIAGNOSIS — M25651 Stiffness of right hip, not elsewhere classified: Secondary | ICD-10-CM

## 2022-04-24 DIAGNOSIS — M25652 Stiffness of left hip, not elsewhere classified: Secondary | ICD-10-CM

## 2022-04-28 ENCOUNTER — Ambulatory Visit: Payer: BLUE CROSS/BLUE SHIELD | Admitting: Physical Therapy

## 2022-04-30 ENCOUNTER — Ambulatory Visit: Payer: BLUE CROSS/BLUE SHIELD | Admitting: Nurse Practitioner

## 2022-05-01 ENCOUNTER — Ambulatory Visit: Payer: BLUE CROSS/BLUE SHIELD

## 2022-05-04 NOTE — Therapy (Deleted)
OUTPATIENT PHYSICAL THERAPY TREATMENT NOTE   Patient Name: [name] MRN: [id number] DOB:Sep 04, 1966, 56 y.o., male Today's Date: 05/04/2022  PCP: Gifford Shave, MD  REFERRING PROVIDER: Leeanne Rio, MD  END OF SESSION:     Past Medical History:  Diagnosis Date   Arthritis    Chronic bilateral low back pain with right-sided sciatica    ED (erectile dysfunction)    GERD (gastroesophageal reflux disease)    Hydronephrosis of right kidney    Hypertension    followed by pcp   (nuclear test study 02-28-2010 in epic, normal no ischemia, nuclear ef 51%)   Pre-diabetes    Prostate cancer ([place])    urologist--dr newsome   -- dx 04/ 2022,   started ADT 06-14-2021   Wears glasses    Past Surgical History:  Procedure Laterality Date   COLONOSCOPY  02/04/2006   Dr.Jacobs   CYSTOSCOPY W/ URETERAL STENT PLACEMENT Bilateral 06/18/2021   Procedure: CYSTOSCOPY WITH BILATERAL RETROGRADE PYELOGRAM/ RIGHT URETERAL STENT PLACEMENT;  Surgeon: [name], MD;  Location: [facility];  Service: Urology;  Laterality: Bilateral;   LAPAROSCOPIC CHOLECYSTECTOMY  01/20/2008   @ [place]   TOTAL KNEE ARTHROPLASTY Right 07/16/2018   Procedure: RIGHT TOTAL KNEE REPLACEMENT;  Surgeon: Marybelle Killings, MD;  Location: [place];  Service: Orthopedics;  Laterality: Right;   Patient Active Problem List   Diagnosis Date Noted   Seborrheic keratosis 04/10/2022   Chronic low back pain without sciatica 04/10/2022   A gamma beta+ HPFH and beta 0 thalassemia in [facility]) 01/29/2022   Prostate cancer ([place]) 01/29/2022   Encounter to establish care 08/02/2021   Erectile dysfunction 02/27/2020   Back pain without sciatica 02/27/2020   Class 3 severe obesity due to excess calories with serious comorbidity and body mass index (BMI) of 40.0 to 44.9 in adult ([place]) 02/27/2020   Status post total knee replacement, right 10/15/2018   Arthritis of right knee 07/16/2018   Prediabetes 01/02/2017    Essential hypertension 09/04/2016    REFERRING DIAG: M54.50,G89.29 (ICD-10-CM) - Chronic low back pain without sciatica, unspecified back pain laterality   THERAPY DIAG:  No diagnosis found.  SUBJECTIVE:                                                                                                                                                                                            SUBJECTIVE STATEMENT: Pt reports his low back pain is the same. Pt reports some completion of his HEP since eval.    PAIN:  Are you having pain? Yes: NPRS scale: 8/10 Pain location: Middle low back  Pain description: throbbing, aching  Aggravating factors: standing, walking, sitting too long  Relieving factors: tylenol does improve pain to < 4/10   PERTINENT HISTORY:  Rt TKA , Prostate Cancer  Pre-diabetic, HTN, obesity     PRECAUTIONS: Other: cancer   WEIGHT BEARING RESTRICTIONS No   FALLS:  Has patient fallen in last 6 months? No   LIVING ENVIRONMENT: Lives with: lives with their family and lives with their spouse Lives in: House/apartment Stairs: No Has following equipment at home: Single point cane, walker from Rt TKA   OCCUPATION: retired, physical labor in the past    PLOF: Independent and Leisure: likes to be with grandkids, spouse   PATIENT GOALS Pt would like to be able to somewhat pain free.      OBJECTIVE:    DIAGNOSTIC FINDINGS:  IMPRESSION: 1. No evidence of metastatic disease in the lumbar spine. 2. Lumbar disc and facet degeneration with mild-to-moderate neural foraminal stenosis at L3-4, L4-5, and L5-S1. 3. Mild lateral recess stenosis at L4-5. 4. Bulky retroperitoneal lymphadenopathy, more fully evaluated on today's earlier CT.     PATIENT SURVEYS:  FOTO 32% goal 52%     COGNITION:           Overall cognitive status: Within functional limits for tasks assessed                          SENSATION: Not tested.  Pt prediabetic.  Numbness with finger tips  and bottoms of feet    MUSCLE LENGTH: Hamstrings: tight (50 deg) no pain in back  Thomas test: tight bilateral    POSTURE:  Stands, sits with hips abducted, external rotated , trunk flexed at hips, loss of lumbar lordosis    PALPATION: Grossly sore across low lumbar paraspinals and superior aspect of glutes bilateral   LUMBAR ROM:    Active  A/PROM  04/17/2022  Flexion Mid shin , min pain   Extension 50%   Right lateral flexion 50% pain   Left lateral flexion 50% pain  Right rotation 50% pain   Left rotation 50% pain    (Blank rows = not tested)   LE ROM:   Active  Right 04/17/2022 Left 04/17/2022  Hip flexion      Hip extension      Hip abduction      Hip adduction      Hip internal rotation      Hip external rotation      Knee flexion      Knee extension      Ankle dorsiflexion      Ankle plantarflexion      Ankle inversion      Ankle eversion       (Blank rows = not tested)   LE MMT:   MMT Right 04/17/2022 Left 04/17/2022  Hip flexion 4+ 4  Hip extension 3+ 3+  Hip abduction 3+ 3+  Hip adduction      Hip internal rotation      Hip external rotation      Knee flexion 5 5  Knee extension 5 5  Ankle dorsiflexion 5 4+  Ankle plantarflexion      Ankle inversion      Ankle eversion       (Blank rows = not tested)   LUMBAR SPECIAL TESTS:  Straight leg raise test: Negative, Slump test: Negative, Single leg stance test: NT, and Thomas test: pain in lumbar    FUNCTIONAL TESTS:  5 times sit to stand: 17.8 sec , light hand A on thighs  30 seconds chair stand test NT    GAIT: Distance walked: 100 Assistive device utilized: None Level of assistance: Complete Independence Comments: Not remarkable other than slow pace        TODAY'S TREATMENT   [facility] Adult PT Treatment:                                                DATE: 05/05/22 Therapeutic Exercise: *** Manual Therapy: *** Neuromuscular re-ed: *** Therapeutic Activity: *** Modalities: *** Self  Care: ***   [name] Adult PT Treatment:                                                DATE: 04/24/22 Therapeutic Exercise: Seated flexion forward and laterally x2 each 20" Seated hamstring stretch x2 20" LTR x5 5" Modified Thomas stretch x1 30" Piriformis stretch x2 20" PPT x10 3" Bridging x10 small range HEP updated  Self Care: Discussed sleeping positions and support for comfort  Initial Eval Treatment: PT, POC, evaluation findings      PATIENT EDUCATION:  Education details: PT/POC, HEP  Person educated: Patient Education method: Consulting civil engineer, Demonstration, and Verbal cues Education comprehension: verbalized understanding and needs further education     HOME EXERCISE PROGRAM: Access Code: [email or web address] URL: [email or web address] Date: 04/24/2022 Prepared by: Gar Ponto  Exercises - Seated Flexion Stretch with Swiss Ball  - 2 x daily - 7 x weekly - 1 sets - 6 reps - 20 hold - Supine Lower Trunk Rotation  - 2 x daily - 7 x weekly - 1-2 sets - 10 reps - 10-15 hold - Supine Piriformis Stretch with Foot on Ground  - 2 x daily - 7 x weekly - 1-2 sets - 5-10 reps - 15-30 hold - Seated Hamstring Stretch  - 2 x daily - 7 x weekly - 1-2 sets - 5-10 reps - 15-30 hold - Modified Thomas Stretch  - 1-2 x daily - 7 x weekly - 1 sets - 3 reps - 30 hold - Supine Posterior Pelvic Tilt  - 1 x daily - 7 x weekly - 2 sets - 10 reps - 3 hold - Supine Bridge  - 1 x daily - 7 x weekly - 2 sets - 10 reps - 3 hold   ASSESSMENT:   CLINICAL IMPRESSION: PT was completed for lumbopelvic flexibility and strengthening. Pt returned proper demonstration of the therex. Therex were made part of the pt's HEP. Following the session, pt reported his low back felt better, rating a 6/10. Pt will continue to benefit from skilled PT to address impairments to optimize function with less pain.  OBJECTIVE IMPAIRMENTS decreased activity tolerance, decreased endurance, decreased mobility, difficulty  walking, decreased ROM, decreased strength, hypomobility, increased edema, increased fascial restrictions, impaired flexibility, impaired sensation, postural dysfunction, obesity, and pain.    ACTIVITY LIMITATIONS cleaning, community activity, meal prep, yard work, shopping, yard work, and recreation .    PERSONAL FACTORS Time since onset of injury/illness/exacerbation and 3+ comorbidities: HTN, rt TKA , obesity   are also affecting patient's functional outcome.      REHAB POTENTIAL: Excellent   CLINICAL DECISION MAKING: Stable/uncomplicated   EVALUATION  COMPLEXITY: Low     GOALS: Goals reviewed with patient? Yes     LONG TERM GOALS: Target date: 05/29/2022   Pt will be I with HEP for core, posture, mobility  Baseline: unknown Goal status: INITIAL   2.  Pt will be able to properly lift, carry items , 25 lbs without increasing back pain  Baseline: unknown Goal status: INITIAL   3.  Pt will be able to stand for 15 min for home tasks with low back pain < 5/10 most of the time.  Baseline: 5 min, pain is 7/10-8/10 Goal status: INITIAL   4.  Pt will improve trunk motion to [facility] with no pain (just stretch) to demo more mobility  Baseline: limited in all planes , pain with lateral flexion, extension  Goal status: INITIAL   5.  Pt will work towards a habit of walking 3 x per week for up to 30 min for overall health and fitness Baseline: sedentary Goal status: INITIAL       PLAN: PT FREQUENCY:  2 x per week    PT DURATION: 6 weeks   PLANNED INTERVENTIONS: Therapeutic exercises, Therapeutic activity, Neuromuscular re-education, Balance training, Gait training, Patient/Family education, Joint mobilization, Aquatic Therapy, Cryotherapy, Moist heat, Taping, Traction, and Manual therapy.   PLAN FOR NEXT SESSION: check HEP, begin and progress core, lifting mechanics    Allen Ralls MS, PT 05/04/22 8:20 PM

## 2022-05-05 ENCOUNTER — Ambulatory Visit: Payer: BLUE CROSS/BLUE SHIELD | Attending: Family Medicine | Admitting: Physical Therapy

## 2022-05-05 ENCOUNTER — Telehealth: Payer: Self-pay | Admitting: Physical Therapy

## 2022-05-05 ENCOUNTER — Other Ambulatory Visit: Payer: Self-pay

## 2022-05-05 DIAGNOSIS — M25651 Stiffness of right hip, not elsewhere classified: Secondary | ICD-10-CM | POA: Insufficient documentation

## 2022-05-05 DIAGNOSIS — G8929 Other chronic pain: Secondary | ICD-10-CM | POA: Insufficient documentation

## 2022-05-05 DIAGNOSIS — M25652 Stiffness of left hip, not elsewhere classified: Secondary | ICD-10-CM | POA: Insufficient documentation

## 2022-05-05 DIAGNOSIS — M545 Low back pain, unspecified: Secondary | ICD-10-CM | POA: Insufficient documentation

## 2022-05-05 NOTE — Telephone Encounter (Signed)
Called patient regarding his missed appt today at 1:30. He states he thought it was at 2:15 and declines another appt today at a later time.  Will see him 5/11. ? ?Raeford Razor, PT ?05/05/22 2:01 PM ?Phone: [phone] ?Fax: [phone]  ?

## 2022-05-07 NOTE — Therapy (Signed)
?OUTPATIENT PHYSICAL THERAPY TREATMENT NOTE ? ? ?Patient Name: [name] ?MRN: [id number] ?DOB:02/10/1966, 56 y.o., male ?Today's Date: 05/08/2022 ? ?PCP: Gifford Shave, MD ? ?REFERRING PROVIDER: Leeanne Rio, MD ? ?END OF SESSION:  ? PT End of Session - 05/08/22 1434   ? ? Visit Number 3   ? Number of Visits 12   ? Date for PT Re-Evaluation 05/28/22   ? Authorization Type BCBS   ? PT Start Time 1420   ? PT Stop Time 1500   ? PT Time Calculation (min) 40 min   ? Activity Tolerance Patient tolerated treatment well   ? Behavior During Therapy [facility] for tasks assessed/performed   ? ?  ?  ? ?  ? ? ? ?Past Medical History:  ?Diagnosis Date  ? Arthritis   ? Chronic bilateral low back pain with right-sided sciatica   ? ED (erectile dysfunction)   ? GERD (gastroesophageal reflux disease)   ? Hydronephrosis of right kidney   ? Hypertension   ? followed by pcp   (nuclear test study 02-28-2010 in epic, normal no ischemia, nuclear ef 51%)  ? Pre-diabetes   ? Prostate cancer ([place])   ? urologist--dr newsome   -- dx 04/ 2022,   started ADT 06-14-2021  ? Wears glasses   ? ?Past Surgical History:  ?Procedure Laterality Date  ? COLONOSCOPY  02/04/2006  ? Dr.Jacobs  ? CYSTOSCOPY W/ URETERAL STENT PLACEMENT Bilateral 06/18/2021  ? Procedure: CYSTOSCOPY WITH BILATERAL RETROGRADE PYELOGRAM/ RIGHT URETERAL STENT PLACEMENT;  Surgeon: [name], MD;  Location: [facility];  Service: Urology;  Laterality: Bilateral;  ? LAPAROSCOPIC CHOLECYSTECTOMY  01/20/2008  ? @ [facility]  ? TOTAL KNEE ARTHROPLASTY Right 07/16/2018  ? Procedure: RIGHT TOTAL KNEE REPLACEMENT;  Surgeon: Marybelle Killings, MD;  Location: [place];  Service: Orthopedics;  Laterality: Right;  ? ?Patient Active Problem List  ? Diagnosis Date Noted  ? Seborrheic keratosis 04/10/2022  ? Chronic low back pain without sciatica 04/10/2022  ? A gamma beta+ HPFH and beta 0 thalassemia in [facility]) 01/29/2022  ? Prostate cancer ([place]) 01/29/2022  ? Encounter to  establish care 08/02/2021  ? Erectile dysfunction 02/27/2020  ? Back pain without sciatica 02/27/2020  ? Class 3 severe obesity due to excess calories with serious comorbidity and body mass index (BMI) of 40.0 to 44.9 in adult [facility]) 02/27/2020  ? Status post total knee replacement, right 10/15/2018  ? Arthritis of right knee 07/16/2018  ? Prediabetes 01/02/2017  ? Essential hypertension 09/04/2016  ? ? ?REFERRING DIAG: M54.50,G89.29 (ICD-10-CM) - Chronic low back pain without sciatica, unspecified back pain laterality  ? ?THERAPY DIAG:  ?Chronic bilateral low back pain without sciatica ? ?Stiffness of left hip, not elsewhere classified ? ?Stiffness of right hip, not elsewhere classified ? ?SUBJECTIVE:                                                                                                                                                                                          ?  ?  SUBJECTIVE STATEMENT: ?Pt reports his low back is feeling a little better. The stretches help. He notes he is primarily completed the bridges and the seated trunk stretch. Standing continues to aggravate his low back. Today has been a good day re: pain. ?  ? ?PAIN:  ?Are you having pain? Yes: NPRS scale: 4/10; 8/10 with standing ?Pain location: Middle low back  ?Pain description: throbbing, aching  ?Aggravating factors: standing, walking, sitting too long  ?Relieving factors: tylenol does improve pain to < 4/10  ? ?PERTINENT HISTORY:  ?Rt TKA , Prostate Cancer  ?Pre-diabetic, HTN, obesity ?  ?  ?PRECAUTIONS: Other: cancer ?  ?WEIGHT BEARING RESTRICTIONS No ?  ?FALLS:  ?Has patient fallen in last 6 months? No ?  ?LIVING ENVIRONMENT: ?Lives with: lives with their family and lives with their spouse ?Lives in: House/apartment ?Stairs: No ?Has following equipment at home: Single point cane, walker from Rt TKA ?  ?OCCUPATION: retired, physical labor in the past  ?  ?PLOF: Independent and Leisure: likes to be with grandkids, spouse ?   ?PATIENT GOALS Pt would like to be able to somewhat pain free.  ?  ?  ?OBJECTIVE:  ?  ?DIAGNOSTIC FINDINGS:  ?IMPRESSION: ?1. No evidence of metastatic disease in the lumbar spine. ?2. Lumbar disc and facet degeneration with mild-to-moderate neural ?foraminal stenosis at L3-4, L4-5, and L5-S1. ?3. Mild lateral recess stenosis at L4-5. ?4. Bulky retroperitoneal lymphadenopathy, more fully evaluated on ?today's earlier CT. ?  ?  ?PATIENT SURVEYS:  ?FOTO 32% goal 52% ?  ?  ?COGNITION: ?          Overall cognitive status: Within functional limits for tasks assessed               ?           ?SENSATION: ?Not tested.  Pt prediabetic.  Numbness with finger tips and bottoms of feet  ?  ?MUSCLE LENGTH: ?Hamstrings: tight (50 deg) no pain in back  ?Thomas test: tight bilateral  ?  ?POSTURE:  ?Stands, sits with hips abducted, external rotated , trunk flexed at hips, loss of lumbar lordosis  ?  ?PALPATION: ?Grossly sore across low lumbar paraspinals and superior aspect of glutes bilateral ?  ?LUMBAR ROM:  ?  ?Active  A/PROM  ?04/17/2022  ?Flexion Mid shin , min pain   ?Extension 50%   ?Right lateral flexion 50% pain   ?Left lateral flexion 50% pain  ?Right rotation 50% pain   ?Left rotation 50% pain   ? (Blank rows = not tested) ?  ?LE ROM: ?  ?Active  Right ?04/17/2022 Left ?04/17/2022  ?Hip flexion      ?Hip extension      ?Hip abduction      ?Hip adduction      ?Hip internal rotation      ?Hip external rotation      ?Knee flexion      ?Knee extension      ?Ankle dorsiflexion      ?Ankle plantarflexion      ?Ankle inversion      ?Ankle eversion      ? (Blank rows = not tested) ?  ?LE MMT: ?  ?MMT Right ?04/17/2022 Left ?04/17/2022  ?Hip flexion 4+ 4  ?Hip extension 3+ 3+  ?Hip abduction 3+ 3+  ?Hip adduction      ?Hip internal rotation      ?Hip external rotation      ?Knee flexion 5 5  ?Knee extension  5 5  ?Ankle dorsiflexion 5 4+  ?Ankle plantarflexion      ?Ankle inversion      ?Ankle eversion      ? (Blank rows = not  tested) ?  ?LUMBAR SPECIAL TESTS:  ?Straight leg raise test: Negative, Slump test: Negative, Single leg stance test: NT, and Thomas test: pain in lumbar  ?  ?FUNCTIONAL TESTS:  ?5 times sit to stand: 17.8 sec , light hand A on thighs  ?30 seconds chair stand test NT  ?  ?GAIT: ?Distance walked: 100 ?Assistive device utilized: None ?Level of assistance: Complete Independence ?Comments: Not remarkable other than slow pace  ?  ?TODAY'S TREATMENT  ?[facility] Adult PT Treatment:                                                DATE: 05/08/22 ?Therapeutic Exercise: ?Seated flexion forward and laterally x2 each 20" ?Seated hamstring stretch x2 20" ?LTR x5 5" ?Modified Thomas stretch x1 30" ?Piriformis stretch x2 20" ?PPT x10 3" ?Bridging x10 small range ? ?[facility] Adult PT Treatment:                                                DATE: 04/24/22 ?Therapeutic Exercise: ?Seated flexion forward and laterally x2 each 20" ?Seated hamstring stretch x2 20" ?LTR x5 5" ?Modified Thomas stretch x1 30" ?Piriformis stretch x2 20" ?PPT x10 3" ?Bridging x10 small range ?HEP updated ? ?Self Care: ?Discussed sleeping positions and support for comfort ? ?Initial Eval Treatment: ?PT, POC, evaluation findings  ?  ?  ?PATIENT EDUCATION:  ?Education details: PT/POC, HEP  ?Person educated: Patient ?Education method: Explanation, Demonstration, and Verbal cues ?Education comprehension: verbalized understanding and needs further education ?  ?  ?HOME EXERCISE PROGRAM: ?Access Code: [email or web address] ?URL: [email or web address] ?Date: 04/24/2022 ?Prepared by: Gar Ponto ? ?Exercises ?- Seated Flexion Stretch with Swiss Ball  - 2 x daily - 7 x weekly - 1 sets - 6 reps - 20 hold ?- Supine Lower Trunk Rotation  - 2 x daily - 7 x weekly - 1-2 sets - 10 reps - 10-15 hold ?- Supine Piriformis Stretch with Foot on Ground  - 2 x daily - 7 x weekly - 1-2 sets - 5-10 reps - 15-30 hold ?- Seated Hamstring Stretch  - 2 x daily - 7 x weekly - 1-2 sets - 5-10 reps -  15-30 hold ?- Modified Thomas Stretch  - 1-2 x daily - 7 x weekly - 1 sets - 3 reps - 30 hold ?- Supine Posterior Pelvic Tilt  - 1 x daily - 7 x weekly - 2 sets - 10 reps - 3 hold ?- Supine Bridge  - 1 x daily - 7 x week

## 2022-05-08 ENCOUNTER — Ambulatory Visit: Payer: BLUE CROSS/BLUE SHIELD

## 2022-05-08 DIAGNOSIS — M25651 Stiffness of right hip, not elsewhere classified: Secondary | ICD-10-CM

## 2022-05-08 DIAGNOSIS — M25652 Stiffness of left hip, not elsewhere classified: Secondary | ICD-10-CM | POA: Diagnosis present

## 2022-05-08 DIAGNOSIS — G8929 Other chronic pain: Secondary | ICD-10-CM | POA: Diagnosis present

## 2022-05-08 DIAGNOSIS — M545 Low back pain, unspecified: Secondary | ICD-10-CM | POA: Diagnosis present

## 2022-05-12 ENCOUNTER — Ambulatory Visit: Payer: BLUE CROSS/BLUE SHIELD | Admitting: Physical Therapy

## 2022-05-12 NOTE — Therapy (Deleted)
OUTPATIENT PHYSICAL THERAPY TREATMENT NOTE   Patient Name: [name] MRN: [id number] DOB:09/23/1966, 56 y.o., male Today's Date: 05/12/2022  PCP: Gifford Shave, MD  REFERRING PROVIDER: Leeanne Rio, MD  END OF SESSION:      Past Medical History:  Diagnosis Date   Arthritis    Chronic bilateral low back pain with right-sided sciatica    ED (erectile dysfunction)    GERD (gastroesophageal reflux disease)    Hydronephrosis of right kidney    Hypertension    followed by pcp   (nuclear test study 02-28-2010 in epic, normal no ischemia, nuclear ef 51%)   Pre-diabetes    Prostate cancer ([place])    urologist--dr newsome   -- dx 04/ 2022,   started ADT 06-14-2021   Wears glasses    Past Surgical History:  Procedure Laterality Date   COLONOSCOPY  02/04/2006   Dr.Jacobs   CYSTOSCOPY W/ URETERAL STENT PLACEMENT Bilateral 06/18/2021   Procedure: CYSTOSCOPY WITH BILATERAL RETROGRADE PYELOGRAM/ RIGHT URETERAL STENT PLACEMENT;  Surgeon: [name], MD;  Location: [facility];  Service: Urology;  Laterality: Bilateral;   LAPAROSCOPIC CHOLECYSTECTOMY  01/20/2008   @ [place]   TOTAL KNEE ARTHROPLASTY Right 07/16/2018   Procedure: RIGHT TOTAL KNEE REPLACEMENT;  Surgeon: Marybelle Killings, MD;  Location: [place];  Service: Orthopedics;  Laterality: Right;   Patient Active Problem List   Diagnosis Date Noted   Seborrheic keratosis 04/10/2022   Chronic low back pain without sciatica 04/10/2022   A gamma beta+ HPFH and beta 0 thalassemia in [facility]) 01/29/2022   Prostate cancer ([place]) 01/29/2022   Encounter to establish care 08/02/2021   Erectile dysfunction 02/27/2020   Back pain without sciatica 02/27/2020   Class 3 severe obesity due to excess calories with serious comorbidity and body mass index (BMI) of 40.0 to 44.9 in adult ([place]) 02/27/2020   Status post total knee replacement, right 10/15/2018   Arthritis of right knee 07/16/2018   Prediabetes 01/02/2017    Essential hypertension 09/04/2016    REFERRING DIAG: M54.50,G89.29 (ICD-10-CM) - Chronic low back pain without sciatica, unspecified back pain laterality   THERAPY DIAG:  No diagnosis found.  SUBJECTIVE:                                                                                                                                                                                            SUBJECTIVE STATEMENT: Pt reports his low back is feeling a little better. The stretches help. He notes he is primarily completed the bridges and the seated trunk stretch. Standing continues to aggravate his low back. Today has  been a good day re: pain.    PAIN:  Are you having pain? Yes: NPRS scale: 4/10; 8/10 with standing Pain location: Middle low back  Pain description: throbbing, aching  Aggravating factors: standing, walking, sitting too long  Relieving factors: tylenol does improve pain to < 4/10   PERTINENT HISTORY:  Rt TKA , Prostate Cancer  Pre-diabetic, HTN, obesity     PRECAUTIONS: Other: cancer   WEIGHT BEARING RESTRICTIONS No   FALLS:  Has patient fallen in last 6 months? No   LIVING ENVIRONMENT: Lives with: lives with their family and lives with their spouse Lives in: House/apartment Stairs: No Has following equipment at home: Single point cane, walker from [employer]t [employer]   OCCUPATION: retired, physical labor in the past    PLOF: Independent and Leisure: likes to be with grandkids, spouse   PATIENT GOALS Pt would like to be able to somewhat pain free.      OBJECTIVE:    DIAGNOSTIC FINDINGS:  IMPRESSION: 1. No evidence of metastatic disease in the lumbar spine. 2. Lumbar disc and facet degeneration with mild-to-moderate neural foraminal stenosis at L3-4, L4-5, and L5-S1. 3. Mild lateral recess stenosis at L4-5. 4. Bulky retroperitoneal lymphadenopathy, more fully evaluated on today's earlier CT.     PATIENT SURVEYS:  FOTO 32% goal 52%     COGNITION:            Overall cognitive status: Within functional limits for tasks assessed                          SENSATION: Not tested.  Pt prediabetic.  Numbness with finger tips and bottoms of feet    MUSCLE LENGTH: Hamstrings: tight (50 deg) no pain in back  Thomas test: tight bilateral    POSTURE:  Stands, sits with hips abducted, external rotated , trunk flexed at hips, loss of lumbar lordosis    PALPATION: Grossly sore across low lumbar paraspinals and superior aspect of glutes bilateral   LUMBAR ROM:    Active  A/PROM  04/17/2022  Flexion Mid shin , min pain   Extension 50%   Right lateral flexion 50% pain   Left lateral flexion 50% pain  Right rotation 50% pain   Left rotation 50% pain    (Blank rows = not tested)   LE ROM:   Active  Right 04/17/2022 Left 04/17/2022  Hip flexion      Hip extension      Hip abduction      Hip adduction      Hip internal rotation      Hip external rotation      Knee flexion      Knee extension      Ankle dorsiflexion      Ankle plantarflexion      Ankle inversion      Ankle eversion       (Blank rows = not tested)   LE MMT:   MMT Right 04/17/2022 Left 04/17/2022  Hip flexion 4+ 4  Hip extension 3+ 3+  Hip abduction 3+ 3+  Hip adduction      Hip internal rotation      Hip external rotation      Knee flexion 5 5  Knee extension 5 5  Ankle dorsiflexion 5 4+  Ankle plantarflexion      Ankle inversion      Ankle eversion       (Blank rows = not tested)  LUMBAR SPECIAL TESTS:  Straight leg raise test: Negative, Slump test: Negative, Single leg stance test: NT, and Thomas test: pain in lumbar    FUNCTIONAL TESTS:  5 times sit to stand: 17.8 sec , light hand A on thighs  30 seconds chair stand test NT    GAIT: Distance walked: 100 Assistive device utilized: None Level of assistance: Complete Independence Comments: Not remarkable other than slow pace    TODAY'S TREATMENT   [facility] Adult PT Treatment:                                                 DATE: 05/12/22 Therapeutic Exercise: *** Manual Therapy: *** Neuromuscular re-ed: *** Therapeutic Activity: *** Modalities: *** Self Care: ***   [name] Adult PT Treatment:                                                DATE: 05/08/22 Therapeutic Exercise: Seated flexion forward and laterally x2 each 20" Seated hamstring stretch x2 20" LTR x5 5" Modified Thomas stretch x1 30" Piriformis stretch x2 20" PPT x10 3" Bridging x10 small range  [facility] Adult PT Treatment:                                                DATE: 04/24/22 Therapeutic Exercise: Seated flexion forward and laterally x2 each 20" Seated hamstring stretch x2 20" LTR x5 5" Modified Thomas stretch x1 30" Piriformis stretch x2 20" PPT x10 3" Bridging x10 small range HEP updated  Self Care: Discussed sleeping positions and support for comfort  Initial Eval Treatment: PT, POC, evaluation findings      PATIENT EDUCATION:  Education details: PT/POC, HEP  Person educated: Patient Education method: Consulting civil engineer, Demonstration, and Verbal cues Education comprehension: verbalized understanding and needs further education     HOME EXERCISE PROGRAM: Access Code: [email or web address] URL: [email or web address] Date: 04/24/2022 Prepared by: Gar Ponto  Exercises - Seated Flexion Stretch with Swiss Ball  - 2 x daily - 7 x weekly - 1 sets - 6 reps - 20 hold - Supine Lower Trunk Rotation  - 2 x daily - 7 x weekly - 1-2 sets - 10 reps - 10-15 hold - Supine Piriformis Stretch with Foot on Ground  - 2 x daily - 7 x weekly - 1-2 sets - 5-10 reps - 15-30 hold - Seated Hamstring Stretch  - 2 x daily - 7 x weekly - 1-2 sets - 5-10 reps - 15-30 hold - Modified Thomas Stretch  - 1-2 x daily - 7 x weekly - 1 sets - 3 reps - 30 hold - Supine Posterior Pelvic Tilt  - 1 x daily - 7 x weekly - 2 sets - 10 reps - 3 hold - Supine Bridge  - 1 x daily - 7 x weekly - 2 sets - 10 reps - 3 hold   ASSESSMENT:    CLINICAL IMPRESSION: PT was completed for lumbopelvic flexibility and strengthening. Pt has had limited progress so far JM:EQAS relief. Encouraged pt to attend PT sessions more regularly and to complete his provided HEP consistently.  Pt tolerated the session without adverse effects. Will continue PT to address impairments to improve function with less low back pain.  OBJECTIVE IMPAIRMENTS decreased activity tolerance, decreased endurance, decreased mobility, difficulty walking, decreased ROM, decreased strength, hypomobility, increased edema, increased fascial restrictions, impaired flexibility, impaired sensation, postural dysfunction, obesity, and pain.    ACTIVITY LIMITATIONS cleaning, community activity, meal prep, yard work, shopping, yard work, and recreation .    PERSONAL FACTORS Time since onset of injury/illness/exacerbation and 3+ comorbidities: HTN, rt TKA , obesity   are also affecting patient's functional outcome.      REHAB POTENTIAL: Excellent   CLINICAL DECISION MAKING: Stable/uncomplicated   EVALUATION COMPLEXITY: Low     GOALS: Goals reviewed with patient? Yes     LONG TERM GOALS: Target date: 05/29/2022   Pt will be I with HEP for core, posture, mobility  Baseline: unknown Goal status: INITIAL   2.  Pt will be able to properly lift, carry items , 25 lbs without increasing back pain  Baseline: unknown Goal status: INITIAL   3.  Pt will be able to stand for 15 min for home tasks with low back pain < 5/10 most of the time.  Baseline: 5 min, pain is 7/10-8/10 Goal status: INITIAL   4.  Pt will improve trunk motion to [facility] with no pain (just stretch) to demo more mobility  Baseline: limited in all planes , pain with lateral flexion, extension  Goal status: INITIAL   5.  Pt will work towards a habit of walking 3 x per week for up to 30 min for overall health and fitness Baseline: sedentary Goal status: INITIAL       PLAN: PT FREQUENCY:  2 x per week    PT  DURATION: 6 weeks   PLANNED INTERVENTIONS: Therapeutic exercises, Therapeutic activity, Neuromuscular re-education, Balance training, Gait training, Patient/Family education, Joint mobilization, Aquatic Therapy, Cryotherapy, Moist heat, Taping, Traction, and Manual therapy.   PLAN FOR NEXT SESSION: check HEP, begin and progress core, lifting mechanics    Allen Ralls MS, PT 05/12/22 7:46 AM

## 2022-05-13 ENCOUNTER — Ambulatory Visit: Payer: BLUE CROSS/BLUE SHIELD

## 2022-05-13 DIAGNOSIS — G8929 Other chronic pain: Secondary | ICD-10-CM

## 2022-05-13 DIAGNOSIS — M25652 Stiffness of left hip, not elsewhere classified: Secondary | ICD-10-CM

## 2022-05-13 DIAGNOSIS — M25651 Stiffness of right hip, not elsewhere classified: Secondary | ICD-10-CM

## 2022-05-13 DIAGNOSIS — M545 Low back pain, unspecified: Secondary | ICD-10-CM | POA: Diagnosis not present

## 2022-05-13 NOTE — Therapy (Signed)
?OUTPATIENT PHYSICAL THERAPY TREATMENT NOTE ? ? ?Patient Name: [name] ?MRN: [id number] ?DOB:14-May-1966, 56 y.o., male ?Today's Date: 05/13/2022 ? ?PCP: Gifford Shave, MD ? ?REFERRING PROVIDER: Leeanne Rio, MD ? ?END OF SESSION:  ? PT End of Session - 05/13/22 1646   ? ? Visit Number [phone]   ? Number of Visits 12   ? Date for PT Re-Evaluation 05/28/22   ? Authorization Type BCBS   ? PT Start Time 1638   ? PT Stop Time 8182   ? PT Time Calculation (min) 42 min   ? Activity Tolerance Patient tolerated treatment well   ? Behavior During Therapy [facility] for tasks assessed/performed   ? ?  ?  ? ?  ? ? ? ? ?Past Medical History:  ?Diagnosis Date  ? Arthritis   ? Chronic bilateral low back pain with right-sided sciatica   ? ED (erectile dysfunction)   ? GERD (gastroesophageal reflux disease)   ? Hydronephrosis of right kidney   ? Hypertension   ? followed by pcp   (nuclear test study 02-28-2010 in epic, normal no ischemia, nuclear ef 51%)  ? Pre-diabetes   ? Prostate cancer ([place])   ? urologist--dr newsome   -- dx 04/ 2022,   started ADT 06-14-2021  ? Wears glasses   ? ?Past Surgical History:  ?Procedure Laterality Date  ? COLONOSCOPY  02/04/2006  ? Dr.Jacobs  ? CYSTOSCOPY W/ URETERAL STENT PLACEMENT Bilateral 06/18/2021  ? Procedure: CYSTOSCOPY WITH BILATERAL RETROGRADE PYELOGRAM/ RIGHT URETERAL STENT PLACEMENT;  Surgeon: [name], MD;  Location: [facility];  Service: Urology;  Laterality: Bilateral;  ? LAPAROSCOPIC CHOLECYSTECTOMY  01/20/2008  ? @ [facility]  ? TOTAL KNEE ARTHROPLASTY Right 07/16/2018  ? Procedure: RIGHT TOTAL KNEE REPLACEMENT;  Surgeon: Marybelle Killings, MD;  Location: [place];  Service: Orthopedics;  Laterality: Right;  ? ?Patient Active Problem List  ? Diagnosis Date Noted  ? Seborrheic keratosis 04/10/2022  ? Chronic low back pain without sciatica 04/10/2022  ? A gamma beta+ HPFH and beta 0 thalassemia in [facility]) 01/29/2022  ? Prostate cancer ([place]) 01/29/2022  ? Encounter to  establish care 08/02/2021  ? Erectile dysfunction 02/27/2020  ? Back pain without sciatica 02/27/2020  ? Class 3 severe obesity due to excess calories with serious comorbidity and body mass index (BMI) of 40.0 to 44.9 in adult [facility]) 02/27/2020  ? Status post total knee replacement, right 10/15/2018  ? Arthritis of right knee 07/16/2018  ? Prediabetes 01/02/2017  ? Essential hypertension 09/04/2016  ? ? ?REFERRING DIAG: M54.50,G89.29 (ICD-10-CM) - Chronic low back pain without sciatica, unspecified back pain laterality  ? ?THERAPY DIAG:  ?Chronic bilateral low back pain without sciatica ? ?Stiffness of left hip, not elsewhere classified ? ?Stiffness of right hip, not elsewhere classified ? ?SUBJECTIVE:                                                                                                                                                                                          ?  ?  SUBJECTIVE STATEMENT: ?Pt reports the stretches help his low back for a while.  ?  ?PAIN:  ?Are you having pain? Yes: NPRS scale: 7/10; 8/10 with standing ?Pain location: Middle low back  ?Pain description: throbbing, aching  ?Aggravating factors: standing, walking, sitting too long  ?Relieving factors: tylenol does improve pain to < 4/10  ? ?PERTINENT HISTORY:  ?Rt TKA , Prostate Cancer  ?Pre-diabetic, HTN, obesity ?  ?  ?PRECAUTIONS: Other: cancer ?  ?WEIGHT BEARING RESTRICTIONS No ?  ?FALLS:  ?Has patient fallen in last 6 months? No ?  ?LIVING ENVIRONMENT: ?Lives with: lives with their family and lives with their spouse ?Lives in: House/apartment ?Stairs: No ?Has following equipment at home: Single point cane, walker from [employer] ?  ?OCCUPATION: retired, physical labor in the past  ?  ?PLOF: Independent and Leisure: likes to be with grandkids, spouse ?  ?PATIENT GOALS Pt would like to be able to somewhat pain free.  ?  ?  ?OBJECTIVE:  ?  ?DIAGNOSTIC FINDINGS:  ?IMPRESSION: ?1. No evidence of metastatic disease in the lumbar  spine. ?2. Lumbar disc and facet degeneration with mild-to-moderate neural ?foraminal stenosis at L3-4, L4-5, and L5-S1. ?3. Mild lateral recess stenosis at L4-5. ?4. Bulky retroperitoneal lymphadenopathy, more fully evaluated on ?today's earlier CT. ?  ?  ?PATIENT SURVEYS:  ?FOTO 32% goal 52% ?  ?  ?COGNITION: ?          Overall cognitive status: Within functional limits for tasks assessed               ?           ?SENSATION: ?Not tested.  Pt prediabetic.  Numbness with finger tips and bottoms of feet  ?  ?MUSCLE LENGTH: ?Hamstrings: tight (50 deg) no pain in back  ?Thomas test: tight bilateral  ?  ?POSTURE:  ?Stands, sits with hips abducted, external rotated , trunk flexed at hips, loss of lumbar lordosis  ?  ?PALPATION: ?Grossly sore across low lumbar paraspinals and superior aspect of glutes bilateral ?  ?LUMBAR ROM:  ?  ?Active  A/PROM  ?04/17/2022  ?Flexion Mid shin , min pain   ?Extension 50%   ?Right lateral flexion 50% pain   ?Left lateral flexion 50% pain  ?Right rotation 50% pain   ?Left rotation 50% pain   ? (Blank rows = not tested) ?  ?LE ROM: ?  ?Active  Right ?04/17/2022 Left ?04/17/2022  ?Hip flexion      ?Hip extension      ?Hip abduction      ?Hip adduction      ?Hip internal rotation      ?Hip external rotation      ?Knee flexion      ?Knee extension      ?Ankle dorsiflexion      ?Ankle plantarflexion      ?Ankle inversion      ?Ankle eversion      ? (Blank rows = not tested) ?  ?LE MMT: ?  ?MMT Right ?04/17/2022 Left ?04/17/2022  ?Hip flexion 4+ 4  ?Hip extension 3+ 3+  ?Hip abduction 3+ 3+  ?Hip adduction      ?Hip internal rotation      ?Hip external rotation      ?Knee flexion 5 5  ?Knee extension 5 5  ?Ankle dorsiflexion 5 4+  ?Ankle plantarflexion      ?Ankle inversion      ?Ankle eversion      ? (  Blank rows = not tested) ?  ?LUMBAR SPECIAL TESTS:  ?Straight leg raise test: Negative, Slump test: Negative, Single leg stance test: NT, and Thomas test: pain in lumbar  ?  ?FUNCTIONAL TESTS:  ?5  times sit to stand: 17.8 sec , light hand A on thighs  ?30 seconds chair stand test NT  ?  ?GAIT: ?Distance walked: 100 ?Assistive device utilized: None ?Level of assistance: Complete Independence ?Comments: Not remarkable other than slow pace  ?  ?TODAY'S TREATMENT  ?[facility] Adult PT Treatment:                                                DATE: 05/13/22 ?Therapeutic Exercise: ?Nustep L5 mins L6 arms and legs ?Seated flexion forward and laterally x2 each 20" ?Seated hamstring stretch x2 20" ?PPT x 15 3" ?Marching 2x10 each c abdominal activation ?Bridging 2x10 small range ?STS c abdominal activation 2x5 ? ?Manual Therapy ?STM to the lumbar paraspinal and upper gluteal muscles ?Lumbar UPAs L and R grade lll ?  ?[facility] Adult PT Treatment:                                                DATE: 05/08/22 ?Therapeutic Exercise: ?Seated flexion forward and laterally x2 each 20" ?Seated hamstring stretch x2 20" ?LTR x5 5" ?Modified Thomas stretch x1 30" ?Piriformis stretch x2 20" ?PPT x10 3" ?Bridging x10 small range ? ?[facility] Adult PT Treatment:                                                DATE: 04/24/22 ?Therapeutic Exercise: ?Seated flexion forward and laterally x2 each 20" ?Seated hamstring stretch x2 20" ?LTR x5 5" ?Modified Thomas stretch x1 30" ?Piriformis stretch x2 20" ?PPT x10 3" ?Bridging x10 small range ?HEP updated ? ?Self Care: ?Discussed sleeping positions and support for comfort ? ?Initial Eval Treatment: ?PT, POC, evaluation findings  ?  ?  ?PATIENT EDUCATION:  ?Education details: PT/POC, HEP  ?Person educated: Patient ?Education method: Explanation, Demonstration, and Verbal cues ?Education comprehension: verbalized understanding and needs further education ?  ?  ?HOME EXERCISE PROGRAM: ?Access Code: [email or web address] ?URL: [email or web address] ?Date: 04/24/2022 ?Prepared by: Gar Ponto ? ?Exercises ?- Seated Flexion Stretch with Swiss Ball  - 2 x daily - 7 x weekly - 1 sets - 6 reps - 20 hold ?- Supine  Lower Trunk Rotation  - 2 x daily - 7 x weekly - 1-2 sets - 10 reps - 10-15 hold ?- Supine Piriformis Stretch with Foot on Ground  - 2 x daily - 7 x weekly - 1-2 sets - 5-10 reps - 15-30 hold ?- Seated Hamstring Stre

## 2022-05-21 ENCOUNTER — Ambulatory Visit: Payer: BLUE CROSS/BLUE SHIELD | Admitting: Physical Therapy

## 2022-05-23 ENCOUNTER — Ambulatory Visit: Payer: BLUE CROSS/BLUE SHIELD

## 2022-05-23 NOTE — Therapy (Incomplete)
OUTPATIENT PHYSICAL THERAPY TREATMENT NOTE   Patient Name: [name] MRN: [id number] DOB:1966/09/14, 56 y.o., male Today's Date: 05/23/2022  PCP: Marvin Shave, MD  REFERRING PROVIDER: Leeanne Rio, MD  END OF SESSION:       Past Medical History:  Diagnosis Date   Arthritis    Chronic bilateral low back pain with right-sided sciatica    ED (erectile dysfunction)    GERD (gastroesophageal reflux disease)    Hydronephrosis of right kidney    Hypertension    followed by pcp   (nuclear test study 02-28-2010 in epic, normal no ischemia, nuclear ef 51%)   Pre-diabetes    Prostate cancer ([place])    urologist--dr [name]   -- dx 04/ 2022,   started ADT 06-14-2021   Wears glasses    Past Surgical History:  Procedure Laterality Date   COLONOSCOPY  02/04/2006   Dr.Jacobs   CYSTOSCOPY W/ URETERAL STENT PLACEMENT Bilateral 06/18/2021   Procedure: CYSTOSCOPY WITH BILATERAL RETROGRADE PYELOGRAM/ RIGHT URETERAL STENT PLACEMENT;  Surgeon: Marvin Haggard, MD;  Location: [facility];  Service: Urology;  Laterality: Bilateral;   LAPAROSCOPIC CHOLECYSTECTOMY  01/20/2008   @ [place]   TOTAL KNEE ARTHROPLASTY Right 07/16/2018   Procedure: RIGHT TOTAL KNEE REPLACEMENT;  Surgeon: Marvin Killings, MD;  Location: [place];  Service: Orthopedics;  Laterality: Right;   Patient Active Problem List   Diagnosis Date Noted   Seborrheic keratosis 04/10/2022   Chronic low back pain without sciatica 04/10/2022   A gamma beta+ HPFH and beta 0 thalassemia in [facility]) 01/29/2022   Prostate cancer ([place]) 01/29/2022   Encounter to establish care 08/02/2021   Erectile dysfunction 02/27/2020   Back pain without sciatica 02/27/2020   Class 3 severe obesity due to excess calories with serious comorbidity and body mass index (BMI) of 40.0 to 44.9 in adult ([place]) 02/27/2020   Status post total knee replacement, right 10/15/2018   Arthritis of right knee 07/16/2018   Prediabetes 01/02/2017    Essential hypertension 09/04/2016    REFERRING DIAG: M54.50,G89.29 (ICD-10-CM) - Chronic low back pain without sciatica, unspecified back pain laterality   THERAPY DIAG:  No diagnosis found.  SUBJECTIVE:                                                                                                                                                                                            SUBJECTIVE STATEMENT: Pt reports the stretches help his low back for a while.    PAIN:  Are you having pain? Yes: NPRS scale: 7/10; 8/10 with standing Pain location: Middle low back  Pain  description: throbbing, aching  Aggravating factors: standing, walking, sitting too long  Relieving factors: tylenol does improve pain to < 4/10   PERTINENT HISTORY:  Rt TKA , Prostate Cancer  Pre-diabetic, HTN, obesity     PRECAUTIONS: Other: cancer   WEIGHT BEARING RESTRICTIONS No   FALLS:  Has patient fallen in last 6 months? No   LIVING ENVIRONMENT: Lives with: lives with their family and lives with their spouse Lives in: House/apartment Stairs: No Has following equipment at home: Single point cane, walker from Rt T[employer]   OCCUPATION: retired, physical labor in the past    PLOF: Independent and Leisure: likes to be with grandkids, spouse   PATIENT GOALS Pt would like to be able to somewhat pain free.      OBJECTIVE:    DIAGNOSTIC FINDINGS:  IMPRESSION: 1. No evidence of metastatic disease in the lumbar spine. 2. Lumbar disc and facet degeneration with mild-to-moderate neural foraminal stenosis at L3-4, L4-5, and L5-S1. 3. Mild lateral recess stenosis at L4-5. 4. Bulky retroperitoneal lymphadenopathy, more fully evaluated on today's earlier CT.     PATIENT SURVEYS:  FOTO 32% goal 52%     COGNITION:           Overall cognitive status: Within functional limits for tasks assessed                          SENSATION: Not tested.  Pt prediabetic.  Numbness with finger tips and bottoms of  feet    MUSCLE LENGTH: Hamstrings: tight (50 deg) no pain in back  Thomas test: tight bilateral    POSTURE:  Stands, sits with hips abducted, external rotated , trunk flexed at hips, loss of lumbar lordosis    PALPATION: Grossly sore across low lumbar paraspinals and superior aspect of glutes bilateral   LUMBAR ROM:    Active  A/PROM  04/17/2022  Flexion Mid shin , min pain   Extension 50%   Right lateral flexion 50% pain   Left lateral flexion 50% pain  Right rotation 50% pain   Left rotation 50% pain    (Blank rows = not tested)   LE ROM:   Active  Right 04/17/2022 Left 04/17/2022  Hip flexion      Hip extension      Hip abduction      Hip adduction      Hip internal rotation      Hip external rotation      Knee flexion      Knee extension      Ankle dorsiflexion      Ankle plantarflexion      Ankle inversion      Ankle eversion       (Blank rows = not tested)   LE MMT:   MMT Right 04/17/2022 Left 04/17/2022  Hip flexion 4+ 4  Hip extension 3+ 3+  Hip abduction 3+ 3+  Hip adduction      Hip internal rotation      Hip external rotation      Knee flexion 5 5  Knee extension 5 5  Ankle dorsiflexion 5 4+  Ankle plantarflexion      Ankle inversion      Ankle eversion       (Blank rows = not tested)   LUMBAR SPECIAL TESTS:  Straight leg raise test: Negative, Slump test: Negative, Single leg stance test: NT, and Thomas test: pain in lumbar    FUNCTIONAL TESTS:  5 times sit to stand: 17.8 sec , light hand A on thighs  30 seconds chair stand test NT    GAIT: Distance walked: 100 Assistive device utilized: None Level of assistance: Complete Independence Comments: Not remarkable other than slow pace    TODAY'S TREATMENT  [facility] Adult PT Treatment:                                                DATE: 05/23/22 Therapeutic Exercise: *** Manual Therapy: *** Neuromuscular re-ed: *** Therapeutic Activity: *** Modalities: *** Self Care: ***   [name]  Adult PT Treatment:                                                DATE: 05/13/22 Therapeutic Exercise: Nustep L5 mins L6 arms and legs Seated flexion forward and laterally x2 each 20" Seated hamstring stretch x2 20" PPT x 15 3" Marching 2x10 each c abdominal activation Bridging 2x10 small range STS c abdominal activation 2x5  Manual Therapy STM to the lumbar paraspinal and upper gluteal muscles Lumbar UPAs L and R grade lll   [facility] Adult PT Treatment:                                                DATE: 05/08/22 Therapeutic Exercise: Seated flexion forward and laterally x2 each 20" Seated hamstring stretch x2 20" LTR x5 5" Modified Thomas stretch x1 30" Piriformis stretch x2 20" PPT x10 3" Bridging x10 small range  [facility] Adult PT Treatment:                                                DATE: 04/24/22 Therapeutic Exercise: Seated flexion forward and laterally x2 each 20" Seated hamstring stretch x2 20" LTR x5 5" Modified Thomas stretch x1 30" Piriformis stretch x2 20" PPT x10 3" Bridging x10 small range HEP updated  Self Care: Discussed sleeping positions and support for comfort  Initial Eval Treatment: PT, POC, evaluation findings      PATIENT EDUCATION:  Education details: PT/POC, HEP  Person educated: Patient Education method: Consulting civil engineer, Demonstration, and Verbal cues Education comprehension: verbalized understanding and needs further education     HOME EXERCISE PROGRAM: Access Code: [email or web address] URL: [email or web address] Date: 04/24/2022 Prepared by: Gar Ponto  Exercises - Seated Flexion Stretch with Swiss Ball  - 2 x daily - 7 x weekly - 1 sets - 6 reps - 20 hold - Supine Lower Trunk Rotation  - 2 x daily - 7 x weekly - 1-2 sets - 10 reps - 10-15 hold - Supine Piriformis Stretch with Foot on Ground  - 2 x daily - 7 x weekly - 1-2 sets - 5-10 reps - 15-30 hold - Seated Hamstring Stretch  - 2 x daily - 7 x weekly - 1-2 sets - 5-10 reps - 15-30  hold - Modified Thomas Stretch  - 1-2 x daily - 7 x weekly - 1 sets - 3 reps -  30 hold - Supine Posterior Pelvic Tilt  - 1 x daily - 7 x weekly - 2 sets - 10 reps - 3 hold - Supine Bridge  - 1 x daily - 7 x weekly - 2 sets - 10 reps - 3 hold   ASSESSMENT:   CLINICAL IMPRESSION: PT was completed for manual therapy for STM and and UPAs to the lumbar muscles and spine. Pt then completed lumbopelvic flexibility and strengthening. After the session, pt reported a decrease in his low back pain to 3/10 from 7/10.Pt will continue to benefit from skilled PT to improve impairments to optimize back function with less pain.    OBJECTIVE IMPAIRMENTS decreased activity tolerance, decreased endurance, decreased mobility, difficulty walking, decreased ROM, decreased strength, hypomobility, increased edema, increased fascial restrictions, impaired flexibility, impaired sensation, postural dysfunction, obesity, and pain.    ACTIVITY LIMITATIONS cleaning, community activity, meal prep, yard work, shopping, yard work, and recreation .    PERSONAL FACTORS Time since onset of injury/illness/exacerbation and 3+ comorbidities: HTN, rt TKA , obesity   are also affecting patient's functional outcome.      REHAB POTENTIAL: Excellent   CLINICAL DECISION MAKING: Stable/uncomplicated   EVALUATION COMPLEXITY: Low     GOALS: Goals reviewed with patient? Yes     LONG TERM GOALS: Target date: 05/29/2022   Pt will be I with HEP for core, posture, mobility  Baseline: unknown Goal status: INITIAL   2.  Pt will be able to properly lift, carry items , 25 lbs without increasing back pain  Baseline: unknown Goal status: INITIAL   3.  Pt will be able to stand for 15 min for home tasks with low back pain < 5/10 most of the time.  Baseline: 5 min, pain is 7/10-8/10 Goal status: INITIAL   4.  Pt will improve trunk motion to [facility] with no pain (just stretch) to demo more mobility  Baseline: limited in all planes , pain  with lateral flexion, extension  Goal status: INITIAL   5.  Pt will work towards a habit of walking 3 x per week for up to 30 min for overall health and fitness Baseline: sedentary Goal status: INITIAL       PLAN: PT FREQUENCY:  2 x per week    PT DURATION: 6 weeks   PLANNED INTERVENTIONS: Therapeutic exercises, Therapeutic activity, Neuromuscular re-education, Balance training, Gait training, Patient/Family education, Joint mobilization, Aquatic Therapy, Cryotherapy, Moist heat, Taping, Traction, and Manual therapy.   PLAN FOR NEXT SESSION: check HEP, begin and progress core, lifting mechanics    Machele Deihl MS, PT 05/23/22 6:24 AM

## 2022-05-27 ENCOUNTER — Telehealth: Payer: Self-pay

## 2022-05-27 NOTE — Therapy (Incomplete)
OUTPATIENT PHYSICAL THERAPY TREATMENT NOTE/Discharge   Patient Name: [name] MRN: [id number] DOB:06/17/1966, 56 y.o., male Today's Date: 05/28/2022  PCP: Gifford Shave, MD  REFERRING PROVIDER: Leeanne Rio, MD  END OF SESSION:   PT End of Session - 05/28/22 1333     Visit Number 5    Number of Visits 12    Date for PT Re-Evaluation 05/28/22    Authorization Type BC[employer]    PT Start Time 1333    PT Stop Time 4098    PT Time Calculation (min) 44 min    Activity Tolerance Patient tolerated treatment well    Behavior During Therapy WFL for tasks assessed/performed                Past Medical History:  Diagnosis Date   Arthritis    Chronic bilateral low back pain with right-sided sciatica    ED (erectile dysfunction)    GERD (gastroesophageal reflux disease)    Hydronephrosis of right kidney    Hypertension    followed by pcp   (nuclear test study 02-28-2010 in epic, normal no ischemia, nuclear ef 51%)   Pre-diabetes    Prostate cancer ([place])    urologist--dr newsome   -- dx 04/ 2022,   started ADT 06-14-2021   Wears glasses    Past Surgical History:  Procedure Laterality Date   COLONOSCOPY  02/04/2006   Dr.Jacobs   CYSTOSCOPY W/ URETERAL STENT PLACEMENT Bilateral 06/18/2021   Procedure: CYSTOSCOPY WITH BILATERAL RETROGRADE PYELOGRAM/ RIGHT URETERAL STENT PLACEMENT;  Surgeon: [name], MD;  Location: [facility];  Service: Urology;  Laterality: Bilateral;   LAPAROSCOPIC CHOLECYSTECTOMY  01/20/2008   @ [place]   TOTAL KNEE ARTHROPLASTY Right 07/16/2018   Procedure: RIGHT TOTAL KNEE REPLACEMENT;  Surgeon: Marybelle Killings, MD;  Location: [place];  Service: Orthopedics;  Laterality: Right;   Patient Active Problem List   Diagnosis Date Noted   Seborrheic keratosis 04/10/2022   Chronic low back pain without sciatica 04/10/2022   A gamma beta+ HPFH and beta 0 thalassemia in [facility]) 01/29/2022   Prostate cancer ([place]) 01/29/2022    Encounter to establish care 08/02/2021   Erectile dysfunction 02/27/2020   Back pain without sciatica 02/27/2020   Class 3 severe obesity due to excess calories with serious comorbidity and body mass index (BMI) of 40.0 to 44.9 in adult [facility]) 02/27/2020   Status post total knee replacement, right 10/15/2018   Arthritis of right knee 07/16/2018   Prediabetes 01/02/2017   Essential hypertension 09/04/2016    REFERRING DIAG: M54.50,G89.29 (ICD-10-CM) - Chronic low back pain without sciatica, unspecified back pain laterality   THERAPY DIAG:  Chronic bilateral low back pain without sciatica  Stiffness of left hip, not elsewhere classified  Stiffness of right hip, not elsewhere classified  SUBJECTIVE:  SUBJECTIVE STATEMENT: Pt reports he experiences temporary relief from therex, but not overall significant improvement.   PAIN:  Are you having pain? Yes: NPRS scale: 6/10; 8/10 with standing Pain location: Middle low back  Pain description: throbbing, aching  Aggravating factors: standing, walking, sitting too long  Relieving factors: tylenol does improve pain to < 4/10   PERTINENT HISTORY:  Rt TKA , Prostate Cancer  Pre-diabetic, HTN, obesity     PRECAUTIONS: Other: cancer   WEIGHT BEARING RESTRICTIONS No   FALLS:  Has patient fallen in last 6 months? No   LIVING ENVIRONMENT: Lives with: lives with their family and lives with their spouse Lives in: House/apartment Stairs: No Has following equipment at home: Single point cane, walker from [employer]t [employer]   OCCUPATION: retired, physical labor in the past    PLOF: Independent and Leisure: likes to be with grandkids, spouse   PATIENT GOALS Pt would like to be able to somewhat pain free.      OBJECTIVE:    DIAGNOSTIC FINDINGS:  IMPRESSION: 1. No  evidence of metastatic disease in the lumbar spine. 2. Lumbar disc and facet degeneration with mild-to-moderate neural foraminal stenosis at L3-4, L4-5, and L5-S1. 3. Mild lateral recess stenosis at L4-5. 4. Bulky retroperitoneal lymphadenopathy, more fully evaluated on today's earlier CT.     PATIENT SURVEYS:  FOTO 32% goal 52%   COGNITION:           Overall cognitive status: Within functional limits for tasks assessed                          SENSATION: Not tested.  Pt prediabetic.  Numbness with finger tips and bottoms of feet    MUSCLE LENGTH: Hamstrings: tight (50 deg) no pain in back  Thomas test: tight bilateral    POSTURE:  Stands, sits with hips abducted, external rotated , trunk flexed at hips, loss of lumbar lordosis    PALPATION: Grossly sore across low lumbar paraspinals and superior aspect of glutes bilateral   LUMBAR ROM:    Active  A/PROM  04/17/2022 AROmM 05/28/22  Flexion Mid shin , min pain  Min. No pain  Extension 50%  75%, min pain  Right lateral flexion 50% pain  50%, min paiin  Left lateral flexion 50% pain 50%, min pain  Right rotation 50% pain  75%, min pain  Left rotation 50% pain  75%, no pain    (Blank rows = not tested)   LE ROM:   Active  Right 04/17/2022 Left 04/17/2022  Hip flexion      Hip extension      Hip abduction      Hip adduction      Hip internal rotation      Hip external rotation      Knee flexion      Knee extension      Ankle dorsiflexion      Ankle plantarflexion      Ankle inversion      Ankle eversion       (Blank rows = not tested)   LE MMT:   MMT Right 04/17/2022 Left 04/17/2022 Rt 05/28/22 LT 05/28/22  Hip flexion 4+ 4 4+ 4+  Hip extension 3+ 3+ 4 4  Hip abduction 3+ 3+ 4 4  Hip adduction        Hip internal rotation        Hip external rotation        Knee  flexion _0 Knee extension _1 Ankle dorsiflexion 5 4+ 5 5  Ankle plantarflexion        Ankle inversion        Ankle eversion          (Blank rows = not tested)   LUMBAR SPECIAL TESTS:  Straight leg raise test: Negative, Slump test: Negative, Single leg stance test: NT, and Thomas test: pain in lumbar    FUNCTIONAL TESTS:  5 times sit to stand: 17.8 sec , light hand A on thighs . 05/28/22=14.6 c light A from hands 30 seconds chair stand test NT    GAIT: Distance walked: 100 Assistive device utilized: None Level of assistance: Complete Independence Comments: Not remarkable other than slow pace    TODAY'S TREATMENT  [facility] Adult PT Treatment:                                                DATE: 05/28/22 Therapeutic Exercise: Nustep L5 mins L6 arms and legs Seated flexion forward and laterally x2 each 20" Seated hamstring stretch x2 20" PPT x 10 3" Marching 2x10 each c abdominal activation Bridging 2x10 small range Hip clams x15 BluTB Thomas stretch x1 20sec STS c abdominal activation 2x5 5xSTS Updated HEP  Self care/Pt Education: Reassessment of trunk AROM and LE MMT    [facility] Adult PT Treatment:                                                DATE: 05/13/22 Therapeutic Exercise: Nustep L5 mins L6 arms and legs Seated flexion forward and laterally x2 each 20" Seated hamstring stretch x2 20" PPT x 15 3" Marching 2x10 each c abdominal activation Bridging 2x10 small range STS c abdominal activation 2x5  Manual Therapy STM to the lumbar paraspinal and upper gluteal muscles Lumbar UPAs L and R grade lll   [facility] Adult PT Treatment:                                                DATE: 05/08/22 Therapeutic Exercise: Seated flexion forward and laterally x2 each 20" Seated hamstring stretch x2 20" LTR x5 5" Modified Thomas stretch x1 30" Piriformis stretch x2 20" PPT x10 3" Bridging x10 small range   PATIENT EDUCATION:  Education details: PT/POC, HEP  Person educated: Patient Education method: Consulting civil engineer, Media planner, and Verbal cues Education comprehension: verbalized understanding and needs further  education     HOME EXERCISE PROGRAM: Access Code: [email or web address] URL: [email or web address] Date: 05/28/2022 Prepared by: Gar Ponto  Exercises - Seated Flexion Stretch with Swiss Ball  - 2 x daily - 7 x weekly - 1 sets - 6 reps - 20 hold - Supine Lower Trunk Rotation  - 2 x daily - 7 x weekly - 1-2 sets - 10 reps - 10-15 hold - Supine Piriformis Stretch with Foot on Ground  - 2 x daily - 7 x weekly - 1-2 sets - 5-10 reps - 15-30 hold - Seated Hamstring Stretch  - 2 x daily - 7 x weekly - 1-2 sets -  5-10 reps - 15-30 hold - Modified Thomas Stretch  - 1-2 x daily - 7 x weekly - 1 sets - 3 reps - 30 hold - Supine Posterior Pelvic Tilt  - 1 x daily - 7 x weekly - 2 sets - 10 reps - 3 hold - Supine Bridge  - 1 x daily - 7 x weekly - 2 sets - 10 reps - 3 hold - Hooklying Clamshell with Resistance  - 1 x daily - 7 x weekly - 2 sets - 10 reps - 3 hold - Supine March  - 1 x daily - 7 x weekly - 2 sets - 10 reps   ASSESSMENT:   CLINICAL IMPRESSION: Completed thoracolumbar reassessment today. Pt has made progress with improved trunk mobility and strength as demonstrated per MMT of the legs and improved 5xSTS functional test. Re: low back pain, pt reports experiencing temporary relief with the therex, but not sustained relief. At this time, the pt requested to stop PT with his wanting to check with his MD about other diagnostics or treatments to help improve his low back pain. Pt is Dced from [place] at this time with PT goals partial met and pt Ind in a HEP to help maintain pt's achieved LOF.  OBJECTIVE IMPAIRMENTS decreased activity tolerance, decreased endurance, decreased mobility, difficulty walking, decreased ROM, decreased strength, hypomobility, increased edema, increased fascial restrictions, impaired flexibility, impaired sensation, postural dysfunction, obesity, and pain.    ACTIVITY LIMITATIONS cleaning, community activity, meal prep, yard work, shopping, yard work, and recreation .     PERSONAL FACTORS Time since onset of injury/illness/exacerbation and 3+ comorbidities: HTN, rt TKA , obesity   are also affecting patient's functional outcome.      REHAB POTENTIAL: Excellent   CLINICAL DECISION MAKING: Stable/uncomplicated   EVALUATION COMPLEXITY: Low     GOALS: Goals reviewed with patient? Yes     LONG TERM GOALS: Target date: 05/29/2022   Pt will be I with HEP for core, posture, mobility  Baseline: unknown Goal status: MET  2.  Pt will be able to properly lift, carry items , 25 lbs without increasing back pain  Baseline: unknown Goal status: Deferred with pt not progressing to this degree of function    3.  Pt will be able to stand for 15 min for home tasks with low back pain < 5/10 most of the time. 05/28/22: Pt estimates 5 mins Baseline: 5 min, pain is 7/10-8/10 Goal status: Not Met   4.  Pt will improve trunk motion to [facility] with no pain (just stretch) to demo more mobility. 05/28/22: Movement planes increase in AROM except for lateral flexion   Baseline: limited in all planes , pain with lateral flexion, extension  Goal status: Partially met   5.  Pt will work towards a habit of walking 3 x per week for up to 30 min for overall health and fitness. 05/28/22:Pt reports he is walking 3-4x a week for approx 20 mins. Baseline: sedentary Goal status: Partially met       PLAN: PT FREQUENCY:  2 x per week    PT DURATION: 6 weeks   PLANNED INTERVENTIONS: Therapeutic exercises, Therapeutic activity, Neuromuscular re-education, Balance training, Gait training, Patient/Family education, Joint mobilization, Aquatic Therapy, Cryotherapy, Moist heat, Taping, Traction, and Manual therapy.   PLAN FOR NEXT SESSION:  PHYSICAL THERAPY DISCHARGE SUMMARY  Visits from Start of Care: 5  Current functional level related to goals / functional outcomes: See clinical impression and LTGs  Remaining deficits: See clinical impression and LTGs   Education /  Equipment: HEP   Patient agrees to discharge. Patient goals were not met. Patient is being discharged due to the patient's request.  Gar Ponto MS, PT 05/28/22 2:46 PM

## 2022-05-27 NOTE — Telephone Encounter (Signed)
Spoke to pt who reports he had  death in his family and cancelled the 5/24 appt but forgot about the the 5/26 appt. Pt was reminded of his 05/28/22 appt. 5/26 appt should not count as a no show visit.

## 2022-05-28 ENCOUNTER — Ambulatory Visit: Payer: BLUE CROSS/BLUE SHIELD

## 2022-05-28 DIAGNOSIS — M545 Low back pain, unspecified: Secondary | ICD-10-CM

## 2022-05-28 DIAGNOSIS — M25651 Stiffness of right hip, not elsewhere classified: Secondary | ICD-10-CM

## 2022-05-28 DIAGNOSIS — M25652 Stiffness of left hip, not elsewhere classified: Secondary | ICD-10-CM

## 2022-05-29 ENCOUNTER — Other Ambulatory Visit: Payer: Self-pay

## 2022-05-29 ENCOUNTER — Ambulatory Visit (INDEPENDENT_AMBULATORY_CARE_PROVIDER_SITE_OTHER): Payer: BLUE CROSS/BLUE SHIELD | Admitting: Family Medicine

## 2022-05-29 VITALS — BP 126/85 | HR 87 | Temp 98.3°F | Ht 70.0 in | Wt 289.2 lb

## 2022-05-29 DIAGNOSIS — L989 Disorder of the skin and subcutaneous tissue, unspecified: Secondary | ICD-10-CM

## 2022-05-29 DIAGNOSIS — L821 Other seborrheic keratosis: Secondary | ICD-10-CM | POA: Diagnosis not present

## 2022-05-29 LAB — POCT SKIN KOH: Skin KOH, POC: NEGATIVE

## 2022-05-29 MED ORDER — TRIAMCINOLONE ACETONIDE 0.1 % EX OINT
1.0000 "application " | TOPICAL_OINTMENT | Freq: Two times a day (BID) | CUTANEOUS | 0 refills | Status: DC
  Filled 2022-05-29: qty 80, 14d supply, fill #0

## 2022-05-29 NOTE — Progress Notes (Signed)
    SUBJECTIVE:   CHIEF COMPLAINT / HPI:   Right hand lesion Present for several decades, would like to have it removed as it bothers him. Denies any bleeding, pruritis.  Patient does note that it has progressed in the past but has recurred.  Left dorsal arm Patient notes for the last 2 weeks he has had a lesion on his left arm.  He attempted to use Compound W with no significant results.  Denies any pruritus, bleeding.  Bilateral calf lesions Patient reports that about 1 month ago he had lesions pop up on his calf (1 on the left, 2 on the right).  There is no bleeding associated with them but they are very itchy.  He does not have any history of eczema or venous stasis that he is aware of.  He does note that he is on prostatic cancer treatment.  PERTINENT  PMH / PSH: Reviewed  OBJECTIVE:   BP 126/85   Pulse 87   Temp 98.3 F (36.8 C)   Ht '5\' 10"'$  (1.778 m)   Wt 289 lb 3.2 oz (131.2 kg)   SpO2 94%   BMI 41.50 kg/m   General: NAD, well-appearing, well-nourished Respiratory: No respiratory distress, breathing comfortably, able to speak in full sentences Skin: warm and dry, brown raised lesions on the right dorsal hand and left dorsal forearm as shown below. Bilateral calf lesions that are mildly scaly as pictured below.     Right dorsal hand   Left dorsal forearm     Right calf   Left calf    PROCEDURE: Cryotherapy of right dorsal hand and left dorsal forearm Area surrounding the skin lesion was prepared in usual sterile manner.  Previous test performed ensuring coverage of entire area as above.  Cryotherapy was applied via cryogun for 30 seconds until skin blanched.  This is allowed to thaw and cryotherapy repeated for 4 more applications.  ASSESSMENT/PLAN:   Right dorsal hand lesion Appears most consistent with an SK due to it's stuck-on appearance. Unlikely to be a concerning cancer and does not appear consistent with any type of melanoma. Cryotherapy treatment  completed today and was well tolerated. Patient will likely need 2-3 full cryotherapy treatments for resolution.   Left dorsal forearm Likely small SK, doesn't seem consistent with cancerous lesion at this time. Cryotherapy treatment well-tolerated today.  Bilateral calf lesions Unclear etiology, differential includes nummular eczema, psoriasis, insect bite reactions. KOH was completed in clinic and was negative. Will trial treatment with topical steroids and encourage close follow-up. - Triamcinolone 0.1% BID  - Eucerin cream as needed    [name], [name]

## 2022-05-29 NOTE — Patient Instructions (Addendum)
We did cryotherapy (freezing treatment) on your hand and arm. The lesion on your hand will likely need several treatments.  For the spots on your legs, they were negative for any fungal infection so we will do a trial of steroid ointment twice daily for up to 14 days. I also recommend using a hydrating cream such as Eucerin or vaseline on the area as well.  We will want to see you back in about 4 weeks to check on how the leg lesions are doing.

## 2022-05-29 NOTE — Progress Notes (Signed)
    SUBJECTIVE:   CHIEF COMPLAINT / HPI:   Right hand lesion:   PERTINENT  PMH / PSH:  Patient Active Problem List   Diagnosis Date Noted   Seborrheic keratosis 04/10/2022   Chronic low back pain without sciatica 04/10/2022   A gamma beta+ HPFH and beta 0 thalassemia in [facility]) 01/29/2022   Prostate cancer ([place]) 01/29/2022   Encounter to establish care 08/02/2021   Erectile dysfunction 02/27/2020   Back pain without sciatica 02/27/2020   Class 3 severe obesity due to excess calories with serious comorbidity and body mass index (BMI) of 40.0 to 44.9 in adult [facility]) 02/27/2020   Status post total knee replacement, right 10/15/2018   Arthritis of right knee 07/16/2018   Prediabetes 01/02/2017   Essential hypertension 09/04/2016     OBJECTIVE:   There were no vitals taken for this visit.  General: Alert and oriented in no apparent distress, pleasant AA M Resp: Normal WOB  Abdomen: no abdominal pain Skin: Warm and dry Extremities: No lower extremity edema   ASSESSMENT/PLAN:   No problem-specific Assessment & Plan notes found for this encounter.     Erskine Emery, MD [place]

## 2022-05-29 NOTE — Therapy (Deleted)
OUTPATIENT PHYSICAL THERAPY TREATMENT NOTE/Discharge   Patient Name: [name] MRN: [id number] DOB:27-Sep-1966, 56 y.o., male Today's Date: 05/29/2022  PCP: Gifford Shave, MD  REFERRING PROVIDER: Leeanne Rio, MD  END OF SESSION:        Past Medical History:  Diagnosis Date   Arthritis    Chronic bilateral low back pain with right-sided sciatica    ED (erectile dysfunction)    GERD (gastroesophageal reflux disease)    Hydronephrosis of right kidney    Hypertension    followed by pcp   (nuclear test study 02-28-2010 in epic, normal no ischemia, nuclear ef 51%)   Pre-diabetes    Prostate cancer ([place])    urologist--dr newsome   -- dx 04/ 2022,   started ADT 06-14-2021   Wears glasses    Past Surgical History:  Procedure Laterality Date   COLONOSCOPY  02/04/2006   Dr.Jacobs   CYSTOSCOPY W/ URETERAL STENT PLACEMENT Bilateral 06/18/2021   Procedure: CYSTOSCOPY WITH BILATERAL RETROGRADE PYELOGRAM/ RIGHT URETERAL STENT PLACEMENT;  Surgeon: [name], MD;  Location: [facility];  Service: Urology;  Laterality: Bilateral;   LAPAROSCOPIC CHOLECYSTECTOMY  01/20/2008   @ [place]   TOTAL KNEE ARTHROPLASTY Right 07/16/2018   Procedure: RIGHT TOTAL KNEE REPLACEMENT;  Surgeon: Marybelle Killings, MD;  Location: [place];  Service: Orthopedics;  Laterality: Right;   Patient Active Problem List   Diagnosis Date Noted   Seborrheic keratosis 04/10/2022   Chronic low back pain without sciatica 04/10/2022   A gamma beta+ HPFH and beta 0 thalassemia in [facility]) 01/29/2022   Prostate cancer ([place]) 01/29/2022   Encounter to establish care 08/02/2021   Erectile dysfunction 02/27/2020   Back pain without sciatica 02/27/2020   Class 3 severe obesity due to excess calories with serious comorbidity and body mass index (BMI) of 40.0 to 44.9 in adult ([place]) 02/27/2020   Status post total knee replacement, right 10/15/2018   Arthritis of right knee 07/16/2018   Prediabetes  01/02/2017   Essential hypertension 09/04/2016    REFERRING DIAG: M54.50,G89.29 (ICD-10-CM) - Chronic low back pain without sciatica, unspecified back pain laterality   THERAPY DIAG:  No diagnosis found.  SUBJECTIVE:                                                                                                                                                                                            SUBJECTIVE STATEMENT: Pt reports he experiences temporary relief from therex, but not overall significant improvement.   PAIN:  Are you having pain? Yes: NPRS scale: 6/10; 8/10 with standing Pain location: Middle low back  Pain description: throbbing, aching  Aggravating factors: standing, walking, sitting too long  Relieving factors: tylenol does improve pain to < 4/10   PERTINENT HISTORY:  Rt TKA , Prostate Cancer  Pre-diabetic, HTN, obesity     PRECAUTIONS: Other: cancer   WEIGHT BEARING RESTRICTIONS No   FALLS:  Has patient fallen in last 6 months? No   LIVING ENVIRONMENT: Lives with: lives with their family and lives with their spouse Lives in: House/apartment Stairs: No Has following equipment at home: Single point cane, walker from Rt TKA   OCCUPATION: retired, physical labor in the past    PLOF: Independent and Leisure: likes to be with grandkids, spouse   PATIENT GOALS Pt would like to be able to somewhat pain free.      OBJECTIVE:    DIAGNOSTIC FINDINGS:  IMPRESSION: 1. No evidence of metastatic disease in the lumbar spine. 2. Lumbar disc and facet degeneration with mild-to-moderate neural foraminal stenosis at L3-4, L4-5, and L5-S1. 3. Mild lateral recess stenosis at L4-5. 4. Bulky retroperitoneal lymphadenopathy, more fully evaluated on today's earlier CT.     PATIENT SURVEYS:  FOTO 32% goal 52%   COGNITION:           Overall cognitive status: Within functional limits for tasks assessed                          SENSATION: Not tested.  Pt  prediabetic.  Numbness with finger tips and bottoms of feet    MUSCLE LENGTH: Hamstrings: tight (50 deg) no pain in back  Thomas test: tight bilateral    POSTURE:  Stands, sits with hips abducted, external rotated , trunk flexed at hips, loss of lumbar lordosis    PALPATION: Grossly sore across low lumbar paraspinals and superior aspect of glutes bilateral   LUMBAR ROM:    Active  A/PROM  04/17/2022 AROmM 05/28/22  Flexion Mid shin , min pain  Min. No pain  Extension 50%  75%, min pain  Right lateral flexion 50% pain  50%, min paiin  Left lateral flexion 50% pain 50%, min pain  Right rotation 50% pain  75%, min pain  Left rotation 50% pain  75%, no pain    (Blank rows = not tested)   LE ROM:   Active  Right 04/17/2022 Left 04/17/2022  Hip flexion      Hip extension      Hip abduction      Hip adduction      Hip internal rotation      Hip external rotation      Knee flexion      Knee extension      Ankle dorsiflexion      Ankle plantarflexion      Ankle inversion      Ankle eversion       (Blank rows = not tested)   LE MMT:   MMT Right 04/17/2022 Left 04/17/2022 Rt 05/28/22 LT 05/28/22  Hip flexion 4+ 4 4+ 4+  Hip extension 3+ 3+ 4 4  Hip abduction 3+ 3+ 4 4  Hip adduction        Hip internal rotation        Hip external rotation        Knee flexion $RemoveBefo'5 5 5 5  'TOurmshpxqS$ Knee extension $RemoveBefore'5 5 5 5  'hkcUXitPIbLgV$ Ankle dorsiflexion 5 4+ 5 5  Ankle plantarflexion        Ankle inversion  Ankle eversion         (Blank rows = not tested)   LUMBAR SPECIAL TESTS:  Straight leg raise test: Negative, Slump test: Negative, Single leg stance test: NT, and Thomas test: pain in lumbar    FUNCTIONAL TESTS:  5 times sit to stand: 17.8 sec , light hand A on thighs . 05/28/22=14.6 c light A from hands 30 seconds chair stand test NT    GAIT: Distance walked: 100 Assistive device utilized: None Level of assistance: Complete [place] Comments: Not remarkable other than slow pace    TODAY'S  TREATMENT  [facility] Adult PT Treatment:                                                DATE: 05/28/22 Therapeutic Exercise: Nustep L5 mins L6 arms and legs Seated flexion forward and laterally x2 each 20" Seated hamstring stretch x2 20" PPT x 10 3" Marching 2x10 each c abdominal activation Bridging 2x10 small range Hip clams x15 BluTB Thomas stretch x1 20sec STS c abdominal activation 2x5 5xSTS Updated HEP  Self care/Pt Education: Reassessment of trunk AROM and LE MMT    [facility] Adult PT Treatment:                                                DATE: 05/13/22 Therapeutic Exercise: Nustep L5 mins L6 arms and legs Seated flexion forward and laterally x2 each 20" Seated hamstring stretch x2 20" PPT x 15 3" Marching 2x10 each c abdominal activation Bridging 2x10 small range STS c abdominal activation 2x5  Manual Therapy STM to the lumbar paraspinal and upper gluteal muscles Lumbar UPAs L and R grade lll   [facility] Adult PT Treatment:                                                DATE: 05/08/22 Therapeutic Exercise: Seated flexion forward and laterally x2 each 20" Seated hamstring stretch x2 20" LTR x5 5" Modified Thomas stretch x1 30" Piriformis stretch x2 20" PPT x10 3" Bridging x10 small range   PATIENT EDUCATION:  Education details: PT/POC, HEP  Person educated: Patient Education method: Consulting civil engineer, Media planner, and Verbal cues Education comprehension: verbalized understanding and needs further education     HOME EXERCISE PROGRAM: Access Code: [email or web address] URL: [email or web address] Date: 05/28/2022 Prepared by: Gar Ponto  Exercises - Seated Flexion Stretch with Swiss Ball  - 2 x daily - 7 x weekly - 1 sets - 6 reps - 20 hold - Supine Lower Trunk Rotation  - 2 x daily - 7 x weekly - 1-2 sets - 10 reps - 10-15 hold - Supine Piriformis Stretch with Foot on Ground  - 2 x daily - 7 x weekly - 1-2 sets - 5-10 reps - 15-30 hold - Seated Hamstring Stretch  - 2 x  daily - 7 x weekly - 1-2 sets - 5-10 reps - 15-30 hold - Modified Thomas Stretch  - 1-2 x daily - 7 x weekly - 1 sets - 3 reps - 30 hold - Supine Posterior Pelvic Tilt  - 1 x daily -  7 x weekly - 2 sets - 10 reps - 3 hold - Supine Bridge  - 1 x daily - 7 x weekly - 2 sets - 10 reps - 3 hold - Hooklying Clamshell with Resistance  - 1 x daily - 7 x weekly - 2 sets - 10 reps - 3 hold - Supine March  - 1 x daily - 7 x weekly - 2 sets - 10 reps   ASSESSMENT:   CLINICAL IMPRESSION: Completed thoracolumbar reassessment today. Pt has made progress with improved trunk mobility and strength as demonstrated per MMT of the legs and improved 5xSTS functional test. Re: low back pain, pt reports experiencing temporary relief with the therex, but not sustained relief. At this time, the pt requested to stop PT with his wanting to check with his MD about other diagnostics or treatments to help improve his low back pain. Pt is Dced from [place] at this time with PT goals partial met and pt Ind in a HEP to help maintain pt's achieved LOF.  OBJECTIVE IMPAIRMENTS decreased activity tolerance, decreased endurance, decreased mobility, difficulty walking, decreased ROM, decreased strength, hypomobility, increased edema, increased fascial restrictions, impaired flexibility, impaired sensation, postural dysfunction, obesity, and pain.    ACTIVITY LIMITATIONS cleaning, community activity, meal prep, yard work, shopping, yard work, and recreation .    PERSONAL FACTORS Time since onset of injury/illness/exacerbation and 3+ comorbidities: HTN, rt TKA , obesity   are also affecting patient's functional outcome.      REHAB POTENTIAL: Excellent   CLINICAL DECISION MAKING: Stable/uncomplicated   EVALUATION COMPLEXITY: Low     GOALS: Goals reviewed with patient? Yes     LONG TERM GOALS: Target date: 05/29/2022   Pt will be I with HEP for core, posture, mobility  Baseline: unknown Goal status: MET  2.  Pt will be able to  properly lift, carry items , 25 lbs without increasing back pain  Baseline: unknown Goal status: Deferred with pt not progressing to this degree of function    3.  Pt will be able to stand for 15 min for home tasks with low back pain < 5/10 most of the time. 05/28/22: Pt estimates 5 mins Baseline: 5 min, pain is 7/10-8/10 Goal status: Not Met   4.  Pt will improve trunk motion to [facility] with no pain (just stretch) to demo more mobility. 05/28/22: Movement planes increase in AROM except for lateral flexion   Baseline: limited in all planes , pain with lateral flexion, extension  Goal status: Partially met   5.  Pt will work towards a habit of walking 3 x per week for up to 30 min for overall health and fitness. 05/28/22:Pt reports he is walking 3-4x a week for approx 20 mins. Baseline: sedentary Goal status: Partially met       PLAN: PT FREQUENCY:  2 x per week    PT DURATION: 6 weeks   PLANNED INTERVENTIONS: Therapeutic exercises, Therapeutic activity, Neuromuscular re-education, Balance training, Gait training, Patient/Family education, Joint mobilization, Aquatic Therapy, Cryotherapy, Moist heat, Taping, Traction, and Manual therapy.   PLAN FOR NEXT SESSION:  PHYSICAL THERAPY DISCHARGE SUMMARY  Visits from Start of Care: 5  Current functional level related to goals / functional outcomes: See clinical impression and LTGs   Remaining deficits: See clinical impression and LTGs   Education / Equipment: HEP   Patient agrees to discharge. Patient goals were not met. Patient is being discharged due to the patient's request.  Gar Ponto MS, PT  05/29/22 8:20 AM

## 2022-05-30 ENCOUNTER — Ambulatory Visit: Payer: BLUE CROSS/BLUE SHIELD | Admitting: Physical Therapy

## 2022-05-30 ENCOUNTER — Other Ambulatory Visit: Payer: Self-pay

## 2022-06-02 ENCOUNTER — Ambulatory Visit: Payer: BLUE CROSS/BLUE SHIELD | Admitting: Physical Therapy

## 2022-06-03 ENCOUNTER — Encounter: Payer: Self-pay | Admitting: *Deleted

## 2022-06-04 ENCOUNTER — Ambulatory Visit: Payer: BLUE CROSS/BLUE SHIELD | Admitting: Physical Therapy

## 2022-06-09 ENCOUNTER — Other Ambulatory Visit: Payer: Self-pay

## 2022-06-23 ENCOUNTER — Other Ambulatory Visit: Payer: Self-pay

## 2022-07-14 ENCOUNTER — Other Ambulatory Visit: Payer: Self-pay

## 2022-07-14 MED ORDER — TAMSULOSIN HCL 0.4 MG PO CAPS
ORAL_CAPSULE | ORAL | 0 refills | Status: DC
  Filled 2022-07-14: qty 30, 30d supply, fill #0

## 2022-07-17 ENCOUNTER — Other Ambulatory Visit: Payer: Self-pay

## 2022-08-09 LAB — GLUCOSE, POCT (MANUAL RESULT ENTRY): POC Glucose: 216 mg/dl — AB (ref 70–99)

## 2022-08-09 LAB — POCT GLYCOSYLATED HEMOGLOBIN (HGB A1C): Hemoglobin-A1c: 5.9

## 2022-09-24 ENCOUNTER — Other Ambulatory Visit: Payer: Self-pay

## 2022-09-24 MED ORDER — TADALAFIL 10 MG PO TABS
ORAL_TABLET | ORAL | 2 refills | Status: DC
  Filled 2022-09-24: qty 4, 30d supply, fill #0

## 2022-09-25 ENCOUNTER — Other Ambulatory Visit: Payer: Self-pay

## 2022-09-25 MED ORDER — TAMSULOSIN HCL 0.4 MG PO CAPS
0.4000 mg | ORAL_CAPSULE | Freq: Every day | ORAL | 3 refills | Status: DC
  Filled 2022-09-25: qty 90, 90d supply, fill #0

## 2022-09-29 ENCOUNTER — Other Ambulatory Visit: Payer: Self-pay

## 2022-09-29 ENCOUNTER — Other Ambulatory Visit: Payer: Self-pay | Admitting: Nurse Practitioner

## 2022-09-29 DIAGNOSIS — I10 Essential (primary) hypertension: Secondary | ICD-10-CM

## 2022-09-30 ENCOUNTER — Other Ambulatory Visit: Payer: Self-pay | Admitting: Nurse Practitioner

## 2022-09-30 DIAGNOSIS — I10 Essential (primary) hypertension: Secondary | ICD-10-CM

## 2022-10-01 ENCOUNTER — Other Ambulatory Visit: Payer: Self-pay

## 2022-10-01 MED ORDER — LOSARTAN POTASSIUM-HCTZ 50-12.5 MG PO TABS
1.0000 | ORAL_TABLET | Freq: Every day | ORAL | 3 refills | Status: DC
  Filled 2022-10-01: qty 90, 90d supply, fill #0
  Filled 2022-12-25 – 2022-12-26 (×2): qty 90, 90d supply, fill #1
  Filled 2023-04-06: qty 90, 90d supply, fill #2
  Filled 2023-07-13: qty 90, 90d supply, fill #3

## 2022-10-27 ENCOUNTER — Other Ambulatory Visit (HOSPITAL_COMMUNITY): Payer: Self-pay

## 2022-10-27 MED ORDER — IBUPROFEN 800 MG PO TABS
800.0000 mg | ORAL_TABLET | Freq: Three times a day (TID) | ORAL | 0 refills | Status: DC | PRN
  Filled 2022-10-27 – 2022-10-28 (×2): qty 20, 7d supply, fill #0

## 2022-10-27 MED ORDER — CHLORHEXIDINE GLUCONATE 0.12 % MT SOLN
15.0000 mL | Freq: Two times a day (BID) | OROMUCOSAL | 0 refills | Status: DC
  Filled 2022-10-27 – 2022-10-28 (×3): qty 473, 16d supply, fill #0

## 2022-10-27 MED ORDER — AMOXICILLIN 500 MG PO CAPS
500.0000 mg | ORAL_CAPSULE | Freq: Three times a day (TID) | ORAL | 0 refills | Status: DC
  Filled 2022-10-27 – 2022-10-28 (×4): qty 21, 7d supply, fill #0

## 2022-10-28 ENCOUNTER — Other Ambulatory Visit: Payer: Self-pay

## 2022-10-28 ENCOUNTER — Other Ambulatory Visit (HOSPITAL_COMMUNITY): Payer: Self-pay

## 2022-11-17 ENCOUNTER — Ambulatory Visit (INDEPENDENT_AMBULATORY_CARE_PROVIDER_SITE_OTHER): Payer: BLUE CROSS/BLUE SHIELD | Admitting: Student

## 2022-11-17 ENCOUNTER — Other Ambulatory Visit: Payer: Self-pay

## 2022-11-17 VITALS — BP 138/72 | HR 86 | Ht 70.0 in | Wt 289.0 lb

## 2022-11-17 DIAGNOSIS — C61 Malignant neoplasm of prostate: Secondary | ICD-10-CM | POA: Diagnosis not present

## 2022-11-17 DIAGNOSIS — M5489 Other dorsalgia: Secondary | ICD-10-CM | POA: Diagnosis not present

## 2022-11-17 DIAGNOSIS — W19XXXA Unspecified fall, initial encounter: Secondary | ICD-10-CM

## 2022-11-17 DIAGNOSIS — F322 Major depressive disorder, single episode, severe without psychotic features: Secondary | ICD-10-CM

## 2022-11-17 MED ORDER — ESCITALOPRAM OXALATE 10 MG PO TABS
10.0000 mg | ORAL_TABLET | Freq: Every day | ORAL | 1 refills | Status: DC
  Filled 2022-11-17 – 2022-11-25 (×2): qty 30, 30d supply, fill #0
  Filled 2022-12-25 – 2022-12-26 (×2): qty 30, 30d supply, fill #1

## 2022-11-17 NOTE — Patient Instructions (Addendum)
[name],  I'm sorry you're dealing with this depression. Let's get on top of it./ I want you to participate in group therapy through the cancer center and would also like you to get plugged in with an individual therapist. Psychologytoday.com is a great place to start as you can find someone who accepts your insurance and  who you think you would "vibe" with. I'm also starting you on Lexapro '10mg'$ . Please take this every day. We will see you back in 2-3 weeks and will probably increase your dose at that visit. We will also get an MRI of your spine. We will call you to schedule this.   If at any point you become overwhelmed by the feelings of wanting to hurt/kill yourself, please go immediately to the [facility] at [place] can also always come to the Emergency Room or call 911 for transport.   Pearla Dubonnet, MD

## 2022-11-17 NOTE — Progress Notes (Unsigned)
SUBJECTIVE:   CHIEF COMPLAINT / HPI:   Major Depression Patient has known history of prostate cancer which is weighing on him. He feels like he is in a "deep depression lane." He has transient thoughts of wanting to hurt/kill himself which are alarming to him as he has never had thoughts like this before. He is dealing with these thoughts by "just praying" when they come up. He finds his faith base gives him resiliency in the face of this depression. He does report having friends he can talk to about his depression. He denies any plan/intent on following through on these thoughts and mostly just wants them to go away. He has access to group therapy through the [facility] cancer center but has not taken advantage of this previously. Not currently on any anti-depressant meds or in individual counseling but is open to both.    Back Pain Chronic and progressive over the past 1 year or so. No known history of injury. Modest improvement with NSAIDs but increasingly these are not helping as much. Review of prior MRIs show OA of the lumbar spine on MRI from ~18 months ago. No new LE weakness, though he does endorse a recent fall which he thinks was due to balance issues and not true weakness. No new incontinence or saddle anesthesia. He saw Dr. Caron Presume for the same issue in April and [facility] ordered an MRI that was never done. We discussed my concern for possible cancer mets given his known prostate CA and he understands the importance of getting this done. His next visit with the cancer center at [facility] is next week.   Recent Fall As above, had a recent fall without injury which he attributes to balance issues as opposed to weakness or mechanical fall. Given his known cancer he is understandably concerned for CNS involvement.     OBJECTIVE:   BP 138/72   Pulse 86   Ht '5\' 10"'$  (1.778 m)   Wt 289 lb (131.1 kg)   SpO2 98%   BMI 41.47 kg/m   Gen: Depressed mood and affect, otherwise engaged in interview  and exam HENT: EOMs intact, MMM, PERRL Neck: No adenopathy Cardio: RRR, no m/r/g Pulm: normal WOB on RA, lungs clear in all fields Abd: Obese, non-distended, non-tender MSK: Bony tenderness over much of the lumbar spine, no paraspinal tenderness or spasm appreciated Neuro: CN II-XII intact, normal rapid alternating movements, normal finger to nose, neg pronator drift, good balance  Flowsheet Row Office Visit from 11/17/2022 in [place]  PHQ-9 Total Score 21        ASSESSMENT/PLAN:   Depression, major, single episode, severe (HCC) PHQ-9 score of 21. Transient SI without plan or intent. Patient able to contract for safety.  - Will start Lexapro '10mg'$  daily - Given [email or web address] resources, patient to establish with an individual therapist - Encouraged to seek out group therapy/support groups through the cancer center. He meets with them next week.   Back pain without sciatica Bony tenderness in the setting of known prostate CA is very concerning for metastases.  - MRI Lumbar Spine - Follow-up with cancer center next week - Will go ahead and order prostatic acid phosphatase, CMP, and sed red which could point to bony mets  Fall Without injury. Sounds like a balance issue. In the setting of normal neuro exam and given the unlikelihood of brain mets with primary prostate CA, will defer head imaging at this time. - Referral to PT for balance  training      Pearla Dubonnet, MD [place]

## 2022-11-18 ENCOUNTER — Other Ambulatory Visit: Payer: Self-pay

## 2022-11-18 DIAGNOSIS — W19XXXA Unspecified fall, initial encounter: Secondary | ICD-10-CM | POA: Insufficient documentation

## 2022-11-18 DIAGNOSIS — F322 Major depressive disorder, single episode, severe without psychotic features: Secondary | ICD-10-CM | POA: Insufficient documentation

## 2022-11-18 HISTORY — DX: Unspecified fall, initial encounter: W19.XXXA

## 2022-11-18 LAB — COMPREHENSIVE METABOLIC PANEL
ALT: 41 IU/L (ref 0–44)
AST: 24 IU/L (ref 0–40)
Albumin/Globulin Ratio: 1.6 (ref 1.2–2.2)
Albumin: 4.5 g/dL (ref 3.8–4.9)
Alkaline Phosphatase: 82 IU/L (ref 44–121)
BUN/Creatinine Ratio: 12 (ref 9–20)
BUN: 13 mg/dL (ref 6–24)
Bilirubin Total: 0.3 mg/dL (ref 0.0–1.2)
CO2: 26 mmol/L (ref 20–29)
Calcium: 9.9 mg/dL (ref 8.7–10.2)
Chloride: 98 mmol/L (ref 96–106)
Creatinine, Ser: 1.07 mg/dL (ref 0.76–1.27)
Globulin, Total: 2.9 g/dL (ref 1.5–4.5)
Glucose: 110 mg/dL — ABNORMAL HIGH (ref 70–99)
Potassium: 3.7 mmol/L (ref 3.5–5.2)
Sodium: 138 mmol/L (ref 134–144)
Total Protein: 7.4 g/dL (ref 6.0–8.5)
eGFR: 81 mL/min/{1.73_m2} (ref >59)

## 2022-11-18 LAB — SEDIMENTATION RATE: Sed Rate: 3 mm/hr (ref 0–30)

## 2022-11-18 NOTE — Assessment & Plan Note (Signed)
>>  ASSESSMENT AND PLAN FOR BACK PAIN WITHOUT SCIATICA WRITTEN ON 11/18/2022  1:21 PM BY SANFORD, JAMES B, MD  Bony tenderness in the setting of known prostate CA is very concerning for metastases.  - MRI Lumbar Spine - Follow-up with cancer center next week - Will go ahead and order prostatic acid phosphatase, CMP, and sed red which could point to bony mets

## 2022-11-18 NOTE — Assessment & Plan Note (Signed)
Without injury. Sounds like a balance issue. In the setting of normal neuro exam and given the unlikelihood of brain mets with primary prostate CA, will defer head imaging at this time. - Referral to PT for balance training

## 2022-11-18 NOTE — Assessment & Plan Note (Addendum)
PHQ-9 score of 21. Transient SI without plan or intent. Patient able to contract for safety.  - Will start Lexapro '10mg'$  daily - Given [email or web address] resources, patient to establish with an individual therapist - Encouraged to seek out group therapy/support groups through the cancer center. He meets with them next week.

## 2022-11-18 NOTE — Assessment & Plan Note (Signed)
Bony tenderness in the setting of known prostate CA is very concerning for metastases.  - MRI Lumbar Spine - Follow-up with cancer center next week - Will go ahead and order prostatic acid phosphatase, CMP, and sed red which could point to bony mets

## 2022-11-21 ENCOUNTER — Other Ambulatory Visit: Payer: Self-pay

## 2022-11-25 ENCOUNTER — Other Ambulatory Visit: Payer: Self-pay

## 2022-12-01 ENCOUNTER — Ambulatory Visit: Payer: Self-pay | Admitting: Student

## 2022-12-04 ENCOUNTER — Ambulatory Visit: Payer: BLUE CROSS/BLUE SHIELD

## 2022-12-11 ENCOUNTER — Other Ambulatory Visit: Payer: Self-pay

## 2022-12-25 ENCOUNTER — Other Ambulatory Visit: Payer: Self-pay

## 2022-12-25 MED ORDER — TADALAFIL 20 MG PO TABS
20.0000 mg | ORAL_TABLET | Freq: Every day | ORAL | 3 refills | Status: DC | PRN
  Filled 2022-12-25: qty 4, 30d supply, fill #0

## 2022-12-25 MED ORDER — TAMSULOSIN HCL 0.4 MG PO CAPS
0.4000 mg | ORAL_CAPSULE | Freq: Every day | ORAL | 0 refills | Status: AC
  Filled 2022-12-25: qty 90, 90d supply, fill #0
  Filled 2023-04-02: qty 90, 90d supply, fill #1

## 2022-12-26 ENCOUNTER — Other Ambulatory Visit: Payer: Self-pay

## 2023-04-06 ENCOUNTER — Other Ambulatory Visit: Payer: Self-pay

## 2023-04-07 ENCOUNTER — Other Ambulatory Visit: Payer: Self-pay

## 2023-07-14 ENCOUNTER — Other Ambulatory Visit: Payer: Self-pay

## 2023-09-08 ENCOUNTER — Other Ambulatory Visit: Payer: Self-pay

## 2023-09-08 ENCOUNTER — Ambulatory Visit (INDEPENDENT_AMBULATORY_CARE_PROVIDER_SITE_OTHER): Payer: Medicare Other | Admitting: Family Medicine

## 2023-09-08 ENCOUNTER — Encounter: Payer: Self-pay | Admitting: Family Medicine

## 2023-09-08 VITALS — BP 141/83 | HR 85 | Ht 70.0 in | Wt 301.6 lb

## 2023-09-08 DIAGNOSIS — Z23 Encounter for immunization: Secondary | ICD-10-CM | POA: Diagnosis not present

## 2023-09-08 DIAGNOSIS — Z122 Encounter for screening for malignant neoplasm of respiratory organs: Secondary | ICD-10-CM

## 2023-09-08 DIAGNOSIS — M545 Low back pain, unspecified: Secondary | ICD-10-CM

## 2023-09-08 DIAGNOSIS — G8929 Other chronic pain: Secondary | ICD-10-CM

## 2023-09-08 DIAGNOSIS — Z87891 Personal history of nicotine dependence: Secondary | ICD-10-CM

## 2023-09-08 DIAGNOSIS — I10 Essential (primary) hypertension: Secondary | ICD-10-CM

## 2023-09-08 DIAGNOSIS — R768 Other specified abnormal immunological findings in serum: Secondary | ICD-10-CM | POA: Diagnosis not present

## 2023-09-08 DIAGNOSIS — R7303 Prediabetes: Secondary | ICD-10-CM | POA: Diagnosis not present

## 2023-09-08 DIAGNOSIS — Z1211 Encounter for screening for malignant neoplasm of colon: Secondary | ICD-10-CM

## 2023-09-08 DIAGNOSIS — R0609 Other forms of dyspnea: Secondary | ICD-10-CM

## 2023-09-08 LAB — POCT GLYCOSYLATED HEMOGLOBIN (HGB A1C): HbA1c, POC (prediabetic range): 5.9 % (ref 5.7–6.4)

## 2023-09-08 MED ORDER — TADALAFIL 20 MG PO TABS
20.0000 mg | ORAL_TABLET | Freq: Every day | ORAL | 3 refills | Status: AC | PRN
  Filled 2023-09-08: qty 10, 30d supply, fill #0

## 2023-09-08 MED ORDER — CLOTRIMAZOLE 1 % EX CREA
1.0000 | TOPICAL_CREAM | Freq: Two times a day (BID) | CUTANEOUS | 0 refills | Status: AC
  Filled 2023-09-08: qty 60, 30d supply, fill #0

## 2023-09-08 NOTE — Assessment & Plan Note (Signed)
Advised patient to obtain MRI of his lumbar spine given persistent pain.  Reassured by overall gross motor ability and normal gait.

## 2023-09-08 NOTE — Assessment & Plan Note (Signed)
Worsening.  Reassured by normal cardiopulmonary exam here today.  Does have history of smoking and prostate malignancy.  Will further evaluate with CBC, CMP, TSH.  EKG of less utility likely due to normal cardiac exam today.  Will also get low-dose CT for lung cancer screening as below given some concern for metastases.

## 2023-09-08 NOTE — Progress Notes (Cosign Needed Addendum)
SUBJECTIVE:   Chief compliant/HPI: annual examination  [name] is a 57 y.o. who presents today for an annual exam.   SOB: about a month duration. Feels he needs to lose weight. Has been worsening. Has been feeling it before this. Mostly when he is active and trying to talk. No chest pain. No dizziness. No history of lung or heart problems. No fevers, n/v/c/d.   Lesion of neck: has been told in the past it was a fatty tumor. Has been there for a years and has been getting bigger. No pain. No redness or swelling. No drainage from the area. No bite or trauma in the area.  Following with oncology for prostate cancer.  He is currently on Xtandi. Follows with oncology every 6 months and also gets infusions then. Has appt tomorrow.  Has not been taking Lexapro 10 mg daily for depression.  Back pain has continued.  He was unable to get his MRI lumbar spine last year. Is interested in this again.  Needs refill of cialis. No issues with dizziness in the past.  OBJECTIVE:   BP (!) 141/83   Pulse 85   Ht 5\' 10"  (1.778 m)   Wt (!) 301 lb 9.6 oz (136.8 kg)   SpO2 95%   BMI 43.28 kg/m   General: Alert and oriented, in NAD Skin: Warm, dry, and intact; right lower leg with circular scaly lesions as pictured below HEENT: NCAT, EOM grossly normal, midline nasal septum, singular 4 cm mobile soft lesion at the midline neck as pictured below without central pore drainage or overlying erythema Cardiac: RRR, no m/r/g appreciated Respiratory: CTAB, breathing and speaking comfortably on RA Abdominal: Soft, nontender, mildly distended, normoactive bowel sounds Extremities: Moves all extremities grossly equally Neurological: No gross focal deficit Psychiatric: Appropriate mood and affect, PHQ-9 = 8 with negative #9        ASSESSMENT/PLAN:   Chronic low back pain without sciatica Advised patient to obtain MRI of his lumbar spine given persistent pain.  Reassured by overall gross motor  ability and normal gait.  Dyspnea on exertion Worsening.  Reassured by normal cardiopulmonary exam here today.  Does have history of smoking and prostate malignancy.  Will further evaluate with CBC, CMP, TSH.  EKG of less utility likely due to normal cardiac exam today.  Will also get low-dose CT for lung cancer screening as below given some concern for metastases.   Annual Examination  Blood pressure value is near goal, discussed continued lifestyle modifications on current medication therapy.  Can discuss going up on medicines as tolerated.  Feel we can refill his Cialis today given his BP.  Discussed all the medication should he feel more dizzy or have vision changes.  Considered the following screening exams based upon USPSTF recommendations: Diabetes screening: ordered.  A1c remains in prediabetic range.  Counseled on lifestyle modifications.  Screening for elevated cholesterol: ordered HIV testing:  Previously nonreactive Hepatitis C:  Antibody previously greater than 11, no HCV NAAT collected.  Will send today. Hepatitis B: ordered given above positive hepatitis C antibody. Colorectal cancer screening: discussed, colonoscopy ordered Lung cancer screening: discussed and ordered. Smoked a PPD for 20 years. Quit 7 years ago. PSA routinely monitored by oncologist who he is following with, next appointment on 09/09/19/2024.  Flu and covid vaccines obtained today. MAW visit referred.  Will send in Lotrimin cream to be applied twice daily for right lower leg lesions.  Suspect could be tinea based on appearance and  worsening with steroids.  Will likely need prolonged course.  Lesion at the base of the neck is likely lipoma.  No treatment needed unless continues to be cosmetically bothersome.  Reassured by lack of compressive effects in terms of his airway.  Follow up in 2 weeks to assess above problems.  Janeal Holmes, MD [facility]

## 2023-09-08 NOTE — Patient Instructions (Addendum)
We collected labs today.  I will follow up with you. I have ordered your colonoscopy and lung cancer screening.  Please be on the look out for scheduling phone calls for these. Return to care if your shortness of breath gets worse before the above studies come back. Continue to follow with your oncologist for your prostate cancer. The lesion on your neck is likely a lipoma and is likely benign. I will follow-up with my team to see if we can schedule you again for the MRI of your back. I will send a message to our Medicare annual wellness visit coordinator to get you an appointment.  This should be by phone. I have sent in a medication called Lotrimin that you should apply to the lesions on your leg to see if this will go away.  I think it could be a fungus. Come back in 2 weeks to see how you are doing with the above.

## 2023-09-09 LAB — COMPREHENSIVE METABOLIC PANEL
ALT: 30 IU/L (ref 0–44)
AST: 20 IU/L (ref 0–40)
Albumin: 4 g/dL (ref 3.8–4.9)
Alkaline Phosphatase: 91 IU/L (ref 44–121)
BUN/Creatinine Ratio: 12 (ref 9–20)
BUN: 15 mg/dL (ref 6–24)
Bilirubin Total: 0.2 mg/dL (ref 0.0–1.2)
CO2: 23 mmol/L (ref 20–29)
Calcium: 9.7 mg/dL (ref 8.7–10.2)
Chloride: 100 mmol/L (ref 96–106)
Creatinine, Ser: 1.21 mg/dL (ref 0.76–1.27)
Globulin, Total: 3.1 g/dL (ref 1.5–4.5)
Glucose: 87 mg/dL (ref 70–99)
Potassium: 4.2 mmol/L (ref 3.5–5.2)
Sodium: 140 mmol/L (ref 134–144)
Total Protein: 7.1 g/dL (ref 6.0–8.5)
eGFR: 70 mL/min/{1.73_m2} (ref >59)

## 2023-09-09 LAB — TSH RFX ON ABNORMAL TO FREE T4: TSH: 1.55 u[IU]/mL (ref 0.450–4.500)

## 2023-09-09 LAB — CBC WITH DIFFERENTIAL/PLATELET
Basophils Absolute: 0.1 10*3/uL (ref 0.0–0.2)
Basos: 1 %
EOS (ABSOLUTE): 0.1 10*3/uL (ref 0.0–0.4)
Eos: 1 %
Hematocrit: 38.5 % (ref 37.5–51.0)
Hemoglobin: 13.1 g/dL (ref 13.0–17.7)
Immature Grans (Abs): 0 10*3/uL (ref 0.0–0.1)
Immature Granulocytes: 1 %
Lymphocytes Absolute: 2.9 10*3/uL (ref 0.7–3.1)
Lymphs: 33 %
MCH: 29 pg (ref 26.6–33.0)
MCHC: 34 g/dL (ref 31.5–35.7)
MCV: 85 fL (ref 79–97)
Monocytes Absolute: 0.8 10*3/uL (ref 0.1–0.9)
Monocytes: 10 %
Neutrophils Absolute: 4.8 10*3/uL (ref 1.4–7.0)
Neutrophils: 54 %
Platelets: 328 10*3/uL (ref 150–450)
RBC: 4.51 x10E6/uL (ref 4.14–5.80)
RDW: 12.8 % (ref 11.6–15.4)
WBC: 8.7 10*3/uL (ref 3.4–10.8)

## 2023-09-09 LAB — LIPID PANEL
Chol/HDL Ratio: 2.6 ratio (ref 0.0–5.0)
Cholesterol, Total: 174 mg/dL (ref 100–199)
HDL: 66 mg/dL (ref >39)
LDL Chol Calc (NIH): 73 mg/dL (ref 0–99)
Triglycerides: 212 mg/dL — ABNORMAL HIGH (ref 0–149)
VLDL Cholesterol Cal: 35 mg/dL (ref 5–40)

## 2023-09-09 LAB — HEPATITIS B SURFACE ANTIBODY,QUALITATIVE: Hep B Surface Ab, Qual: NONREACTIVE

## 2023-09-10 ENCOUNTER — Telehealth: Payer: Self-pay | Admitting: Family Medicine

## 2023-09-10 LAB — HCV RNA (INTERNATIONAL UNITS)
HCV RNA (International Units): 16900000 [IU]/mL
HCV log10: 7.228 {Log_IU}/mL

## 2023-09-10 LAB — HCV RNA QUANT

## 2023-09-10 NOTE — Telephone Encounter (Signed)
Called patient to discuss lab results. Encouraged to stay hydrated with mildly bumped Cr. TG elevation likely postprandial. Will need to discuss and start treatment for HCV given positive antibody and RNA quantitative testing. Patient has appointment 10/1 scheduled for further discussion and treatment.

## 2023-09-15 ENCOUNTER — Telehealth: Payer: Self-pay

## 2023-09-15 NOTE — Telephone Encounter (Signed)
Patient's appointment for CT is scheduled for October 7th at 8am at [facility]. Need to arrive 30 minutes early.   Appointment was to early for him, gave him the number to reschedule.    Drusilla Kanner, CMA

## 2023-09-17 ENCOUNTER — Ambulatory Visit (HOSPITAL_COMMUNITY): Payer: Medicare Other

## 2023-09-21 NOTE — Telephone Encounter (Signed)
Sent MyChart message to patient explaining denial. Green team: please cancel CT chest appointment given this will not be covered.

## 2023-09-21 NOTE — Telephone Encounter (Signed)
Spoke to [employer] about the determination of the peer to peer review for the CT Chest Lung Screening. The first denial was upheld. We can not put in another request for 180 days.  Clemencia Course, CMA

## 2023-09-21 NOTE — Telephone Encounter (Signed)
Patient returns call to nurse line. He reports that he previously had BCBS for his insurance, however, he now has Medicare for his primary and Medicaid for secondary.   Melvenia Beam- was the authorization going through either of these insurances?   Veronda Prude, RN

## 2023-09-25 ENCOUNTER — Telehealth: Payer: Self-pay

## 2023-09-25 NOTE — Telephone Encounter (Signed)
Rescheduled patient and informed him of appointment.  Glennie Hawk, CMA

## 2023-09-29 ENCOUNTER — Ambulatory Visit: Payer: Medicare Other | Admitting: Family Medicine

## 2023-09-29 ENCOUNTER — Ambulatory Visit (HOSPITAL_COMMUNITY)
Admission: RE | Admit: 2023-09-29 | Discharge: 2023-09-29 | Disposition: A | Discharge status: Home / Self Care | Payer: Medicare Other | Source: Ambulatory Visit | Attending: Family Medicine | Admitting: Family Medicine

## 2023-09-29 DIAGNOSIS — Z122 Encounter for screening for malignant neoplasm of respiratory organs: Secondary | ICD-10-CM | POA: Insufficient documentation

## 2023-09-29 DIAGNOSIS — Z87891 Personal history of nicotine dependence: Secondary | ICD-10-CM | POA: Insufficient documentation

## 2023-10-05 ENCOUNTER — Ambulatory Visit (HOSPITAL_COMMUNITY): Payer: Medicare Other

## 2023-10-07 ENCOUNTER — Ambulatory Visit (AMBULATORY_SURGERY_CENTER): Payer: Medicare Other

## 2023-10-07 VITALS — Ht 70.0 in | Wt 290.0 lb

## 2023-10-07 DIAGNOSIS — Z1211 Encounter for screening for malignant neoplasm of colon: Secondary | ICD-10-CM

## 2023-10-07 NOTE — Progress Notes (Signed)

## 2023-10-10 ENCOUNTER — Encounter: Payer: Self-pay | Admitting: Certified Registered Nurse Anesthetist

## 2023-10-13 ENCOUNTER — Encounter: Payer: Self-pay | Admitting: Family Medicine

## 2023-10-13 ENCOUNTER — Ambulatory Visit (INDEPENDENT_AMBULATORY_CARE_PROVIDER_SITE_OTHER): Payer: Medicare Other | Admitting: Family Medicine

## 2023-10-13 ENCOUNTER — Other Ambulatory Visit: Payer: Self-pay

## 2023-10-13 VITALS — BP 160/83 | HR 64 | Ht 70.0 in | Wt 299.6 lb

## 2023-10-13 DIAGNOSIS — Z114 Encounter for screening for human immunodeficiency virus [HIV]: Secondary | ICD-10-CM | POA: Diagnosis not present

## 2023-10-13 DIAGNOSIS — B1920 Unspecified viral hepatitis C without hepatic coma: Secondary | ICD-10-CM

## 2023-10-13 DIAGNOSIS — I10 Essential (primary) hypertension: Secondary | ICD-10-CM

## 2023-10-13 MED ORDER — LOSARTAN POTASSIUM-HCTZ 100-12.5 MG PO TABS
1.0000 | ORAL_TABLET | Freq: Every day | ORAL | 0 refills | Status: DC
  Filled 2023-10-13: qty 30, 30d supply, fill #0

## 2023-10-13 NOTE — Assessment & Plan Note (Addendum)
Persistently elevated during last office visit and on recheck today. Increase losartan-hydrochlorothiazide to 100-12.5 mg daily.  Will send message to front office to schedule repeat BMP lab appointment in 1 week.

## 2023-10-13 NOTE — Assessment & Plan Note (Addendum)
As confirmed with viral load.  Will further characterize with hepatitis A antibody, hepatitis B core antibody/surface antigen, INR, HIV antibody, and right upper quadrant ultrasound with elastography.  FIB-4 score 0.63 and APRI score 0.2, both reassuring. If any of the above positive will need follow-up with infectious disease for treatment.  If negative, can likely begin treatment with [facility].  Will follow-up results.

## 2023-10-13 NOTE — Progress Notes (Cosign Needed Addendum)
    SUBJECTIVE:   CHIEF COMPLAINT / HPI:   Hepatitis C follow up Had positive viral load and antibody at last visit. Never knew he had HCV. Did have multiple sex partners as well as IV cocaine use in the past. Now no substance use and monogamous with his wife. He is amenable to further labs and workup today. No other concerns.  PERTINENT  PMH / PSH: HTN, prostate cancer connected with oncology, prediabetes  OBJECTIVE:   BP (!) 160/83   Pulse 64   Ht 5\' 10"  (1.778 m)   Wt 299 lb 9.6 oz (135.9 kg)   SpO2 94%   BMI 42.99 kg/m   General: Alert and oriented, in NAD Skin: Warm, dry, and intact without lesions HEENT: NCAT, EOM grossly normal, midline nasal septum Cardiac: Regular rate Respiratory: Breathing and speaking comfortably on RA Extremities: Moves all extremities grossly equally Neurological: No gross focal deficit Psychiatric: Appropriate mood and affect   ASSESSMENT/PLAN:   Essential hypertension Persistently elevated during last office visit and on recheck today. Increase losartan-hydrochlorothiazide to 100-12.5 mg daily.  Will send message to front office to schedule repeat BMP lab appointment in 1 week.  Hepatitis C virus infection without hepatic coma As confirmed with viral load.  Will further characterize with hepatitis A antibody, hepatitis B core antibody/surface antigen, INR, HIV antibody, and right upper quadrant ultrasound with elastography.  FIB-4 score 0.63 and APRI score 0.2, both reassuring. If any of the above positive will need follow-up with infectious disease for treatment.  If negative, can likely begin treatment with [facility].  Will follow-up results.   Health maintenance Referred for Medicare annual wellness visit.  Janeal Holmes, MD [facility]

## 2023-10-13 NOTE — Patient Instructions (Signed)
Go up on your blood pressure medicine to losartan 100-hydrochlorothiazide 12.5. I have sent this to your pharmacy. We will get labs today. I will call you when I have the results.

## 2023-10-14 ENCOUNTER — Encounter: Payer: Self-pay | Admitting: Internal Medicine

## 2023-10-14 ENCOUNTER — Other Ambulatory Visit: Payer: Self-pay

## 2023-10-14 ENCOUNTER — Ambulatory Visit: Payer: Medicare Other | Admitting: Internal Medicine

## 2023-10-14 VITALS — BP 171/101 | HR 71 | Temp 97.3°F | Resp 12 | Ht 70.0 in

## 2023-10-14 DIAGNOSIS — Z09 Encounter for follow-up examination after completed treatment for conditions other than malignant neoplasm: Secondary | ICD-10-CM | POA: Diagnosis not present

## 2023-10-14 DIAGNOSIS — D122 Benign neoplasm of ascending colon: Secondary | ICD-10-CM

## 2023-10-14 DIAGNOSIS — Z860100 Personal history of colon polyps, unspecified: Secondary | ICD-10-CM

## 2023-10-14 DIAGNOSIS — D123 Benign neoplasm of transverse colon: Secondary | ICD-10-CM | POA: Diagnosis not present

## 2023-10-14 DIAGNOSIS — K635 Polyp of colon: Secondary | ICD-10-CM | POA: Diagnosis not present

## 2023-10-14 DIAGNOSIS — Z860101 Personal history of adenomatous and serrated colon polyps: Secondary | ICD-10-CM

## 2023-10-14 HISTORY — DX: Personal history of adenomatous and serrated colon polyps: Z86.0101

## 2023-10-14 LAB — PROTIME-INR
INR: 0.9 (ref 0.9–1.2)
Prothrombin Time: 10.4 s (ref 9.1–12.0)

## 2023-10-14 LAB — HIV ANTIBODY (ROUTINE TESTING W REFLEX): HIV Screen 4th Generation wRfx: NONREACTIVE

## 2023-10-14 LAB — HEPATITIS B CORE ANTIBODY, TOTAL: Hep B Core Total Ab: NEGATIVE

## 2023-10-14 LAB — HEPATITIS B SURFACE ANTIGEN: Hepatitis B Surface Ag: NEGATIVE

## 2023-10-14 LAB — HEPATITIS A ANTIBODY, TOTAL: hep A Total Ab: NEGATIVE

## 2023-10-14 MED ORDER — SODIUM CHLORIDE 0.9 % IV SOLN: 500.0000 mL | Freq: Once | INTRAVENOUS | Status: DC

## 2023-10-14 NOTE — Op Note (Signed)
[facility] Patient Name: [name] Procedure Date: 10/14/2023 2:33 PM MRN: [id number] Endoscopist: Iva Boop , MD, [id number] Age: 57 Referring MD:  Date of Birth: [birth year] Gender: Male Account #: [id number] Procedure:                Colonoscopy Indications:              Surveillance: Personal history of adenomatous                            polyps on last colonoscopy 5 years ago, Last                            colonoscopy: October 2019 Medicines:                Monitored Anesthesia Care Procedure:                Pre-Anesthesia Assessment:                           - Prior to the procedure, a History and Physical                            was performed, and patient medications and                            allergies were reviewed. The patient's tolerance of                            previous anesthesia was also reviewed. The risks                            and benefits of the procedure and the sedation                            options and risks were discussed with the patient.                            All questions were answered, and informed consent                            was obtained. Prior Anticoagulants: The patient has                            taken no anticoagulant or antiplatelet agents. ASA                            Grade Assessment: III - A patient with severe                            systemic disease. After reviewing the risks and                            benefits, the patient was deemed in satisfactory  condition to undergo the procedure.                           After obtaining informed consent, the colonoscope                            was passed under direct vision. Throughout the                            procedure, the patient's blood pressure, pulse, and                            oxygen saturations were monitored continuously. The                            Olympus Scope SN: [id number] was  introduced through                            the anus and advanced to the the cecum, identified                            by appendiceal orifice and ileocecal valve. The                            colonoscopy was performed without difficulty. The                            patient tolerated the procedure well. The quality                            of the bowel preparation was good. The ileocecal                            valve, appendiceal orifice, and rectum were                            photographed. Scope In: 2:41:01 PM Scope Out: 2:58:51 PM Scope Withdrawal Time: 0 hours 14 minutes 37 seconds  Total Procedure Duration: 0 hours 17 minutes 50 seconds  Findings:                 The perianal and digital rectal examinations were                            normal.                           A 10 mm polyp was found in the proximal transverse                            colon. The polyp was semi-pedunculated. The polyp                            was removed with a hot snare. Resection and  retrieval were complete. Verification of patient                            identification for the specimen was done. Estimated                            blood loss: none.                           Seven sessile polyps were found in the mid                            transverse colon, distal transverse colon and                            ascending colon. The polyps were 1 to 7 mm in size.                            These polyps were removed with a cold snare.                            Resection and retrieval were complete. Verification                            of patient identification for the specimen was                            done. Estimated blood loss was minimal.                           The exam was otherwise without abnormality on                            direct and retroflexion views. Complications:            No immediate complications. Estimated Blood  Loss:     Estimated blood loss was minimal. Impression:               - One 10 mm polyp in the proximal transverse colon,                            removed with a hot snare. Resected and retrieved.                           - Seven 1 to 7 mm polyps in the mid transverse                            colon, in the distal transverse colon and in the                            ascending colon, removed with a cold snare.                            Resected and retrieved.                           -  The examination was otherwise normal on direct                            and retroflexion views.                           - Personal history of colonic polyps. 2019 4 polyps                            max 11 mm - all adenomas                           2007 7 mm polyp removed, not recovered, proctitis Recommendation:           - Patient has a contact number available for                            emergencies. The signs and symptoms of potential                            delayed complications were discussed with the                            patient. Return to normal activities tomorrow.                            Written discharge instructions were provided to the                            patient.                           - Resume previous diet.                           - Continue present medications.                           - No aspirin, ibuprofen, naproxen, or other                            non-steroidal anti-inflammatory drugs for 2 weeks                            after polyp removal.                           - Repeat colonoscopy is recommended for                            surveillance. The colonoscopy date will be                            determined after pathology results from today's  exam become available for review. Iva Boop, MD 10/14/2023 3:15:42 PM This report has been signed electronically.

## 2023-10-14 NOTE — Progress Notes (Signed)
[facility] History and Physical   Primary Care Physician:  Evette Georges, MD   Reason for Procedure:    Encounter Diagnosis  Name Primary?   Hx of colonic polyps Yes     Plan:    colonoscopy     HPI: [name] is a 57 y.o. male s/p removal of colon polyps in past  2019 4 polyps max 11 mm - all adenomas 2007 7 mm polyp removed, not recovered, proctitis   Past Medical History:  Diagnosis Date   Arthritis    Chronic bilateral low back pain with right-sided sciatica    ED (erectile dysfunction)    GERD (gastroesophageal reflux disease)    Hydronephrosis of right kidney    Hypertension    followed by pcp   (nuclear test study 02-28-2010 in epic, normal no ischemia, nuclear ef 51%)   Pre-diabetes    Prostate cancer (HCC)    urologist--dr newsome   -- dx 04/ 2022,   started ADT 06-14-2021   Wears glasses     Past Surgical History:  Procedure Laterality Date   COLONOSCOPY  02/04/2006   Dr.Jacobs   CYSTOSCOPY W/ URETERAL STENT PLACEMENT Bilateral 06/18/2021   Procedure: CYSTOSCOPY WITH BILATERAL RETROGRADE PYELOGRAM/ RIGHT URETERAL STENT PLACEMENT;  Surgeon: Belva Agee, MD;  Location: [facility];  Service: Urology;  Laterality: Bilateral;   LAPAROSCOPIC CHOLECYSTECTOMY  01/20/2008   @ [facility]   TOTAL KNEE ARTHROPLASTY Right 07/16/2018   Procedure: RIGHT TOTAL KNEE REPLACEMENT;  Surgeon: Eldred Manges, MD;  Location: [facility] OR;  Service: Orthopedics;  Laterality: Right;    Prior to Admission medications   Medication Sig Start Date End Date Taking? Authorizing Provider  acetaminophen (TYLENOL) 500 MG tablet Take 500 mg by mouth every 6 (six) hours as needed for moderate pain or headache.   Yes [provider]  diclofenac Sodium (VOLTAREN) 1 % GEL Apply 2 g topically 4 (four) times daily. 04/09/22  Yes Cresenzo, Cyndi Lennert, MD  losartan-hydrochlorothiazide (HYZAAR) 100-12.5 MG tablet Take 1 tablet by mouth daily. 10/13/23  Yes Mabe,  Earvin Hansen, MD  XTANDI 40 MG capsule Take 160 mg by mouth daily. 01/24/22  Yes [provider]  amoxicillin (AMOXIL) 500 MG capsule Take 1 capsule (500 mg total) by mouth every 8 (eight) hours until finished Patient not taking: Reported on 09/08/2023 10/27/22     aspirin EC 325 MG EC tablet Take 1 tablet (325 mg total) by mouth daily with breakfast. Patient not taking: Reported on 09/08/2023 07/18/18   Cristie Hem, PA-C  chlorhexidine (PERIDEX) 0.12 % solution Swish 15 mLs for 1 minute and spit out 2 (two) times daily for 10 days Patient not taking: Reported on 10/07/2023 10/27/22     clotrimazole (LOTRIMIN AF) 1 % cream Apply 1 Application topically 2 (two) times daily. Apply until resolution. Patient not taking: Reported on 10/07/2023 09/08/23   Evette Georges, MD  escitalopram (LEXAPRO) 10 MG tablet Take 1 tablet (10 mg total) by mouth daily. Patient not taking: Reported on 09/08/2023 11/17/22   Alicia Amel, MD  ibuprofen (ADVIL) 200 MG tablet Take 200 mg by mouth every 6 (six) hours as needed for headache or moderate pain. Patient not taking: Reported on 10/07/2023    [provider]  ibuprofen (ADVIL) 800 MG tablet Take 1 tablet every 8 hours as needed for pain Patient not taking: Reported on 09/08/2023 10/27/22     lidocaine (LIDODERM) 5 % Place 1 patch onto the skin  daily. Remove & Discard patch within 12 hours or as directed by MD Patient not taking: Reported on 09/08/2023 01/29/22   Barbette Merino, NP  methocarbamol (ROBAXIN) 500 MG tablet Take 1 tablet (500 mg total) by mouth 2 (two) times daily. Patient not taking: Reported on 09/08/2023 01/29/22   Barbette Merino, NP  pantoprazole (PROTONIX) 20 MG tablet TAKE 1 TABLET (20 MG TOTAL) BY MOUTH DAILY. Patient not taking: Reported on 10/14/2023 01/29/22 01/29/23  Barbette Merino, NP  tadalafil (CIALIS) 20 MG tablet Take 1 tablet (20 mg total) by mouth daily as needed for up to 30 days for Erectile Dysfunction. Patient not taking:  Reported on 10/07/2023 09/08/23   Evette Georges, MD  tamsulosin (FLOMAX) 0.4 MG CAPS capsule Take 1 capsule (0.4 mg total) by mouth daily. Patient not taking: Reported on 10/14/2023 09/25/22     tamsulosin (FLOMAX) 0.4 MG CAPS capsule Take 1 capsule (0.4 mg total) by mouth daily. Patient not taking: Reported on 10/14/2023 12/25/22     triamcinolone ointment (KENALOG) 0.1 % Apply 1 application to the affected area(s) topically 2 (two) times daily for 14 days. Patient not taking: Reported on 10/07/2023 05/29/22   Evelena Leyden, DO    Current Outpatient Medications  Medication Sig Dispense Refill   acetaminophen (TYLENOL) 500 MG tablet Take 500 mg by mouth every 6 (six) hours as needed for moderate pain or headache.     diclofenac Sodium (VOLTAREN) 1 % GEL Apply 2 g topically 4 (four) times daily. 150 g 3   losartan-hydrochlorothiazide (HYZAAR) 100-12.5 MG tablet Take 1 tablet by mouth daily. 30 tablet 0   XTANDI 40 MG capsule Take 160 mg by mouth daily.     aspirin EC 325 MG EC tablet Take 1 tablet (325 mg total) by mouth daily with breakfast. (Patient not taking: Reported on 09/08/2023) 30 tablet 0   clotrimazole (LOTRIMIN AF) 1 % cream Apply 1 Application topically 2 (two) times daily. Apply until resolution. (Patient not taking: Reported on 10/07/2023) 60 g 0   ibuprofen (ADVIL) 200 MG tablet Take 200 mg by mouth every 6 (six) hours as needed for headache or moderate pain. (Patient not taking: Reported on 10/07/2023)     pantoprazole (PROTONIX) 20 MG tablet TAKE 1 TABLET (20 MG TOTAL) BY MOUTH DAILY. (Patient not taking: Reported on 10/14/2023) 90 tablet 3   tadalafil (CIALIS) 20 MG tablet Take 1 tablet (20 mg total) by mouth daily as needed for up to 30 days for Erectile Dysfunction. (Patient not taking: Reported on 10/07/2023) 10 tablet 3   tamsulosin (FLOMAX) 0.4 MG CAPS capsule Take 1 capsule (0.4 mg total) by mouth daily. (Patient not taking: Reported on 10/14/2023) 360 capsule 0   Current  Facility-Administered Medications  Medication Dose Route Frequency Provider Last Rate Last Admin   0.9 %  sodium chloride infusion  500 mL Intravenous Once Iva Boop, MD        Allergies as of 10/14/2023   (No Known Allergies)    Family History  Problem Relation Age of Onset   Colon polyps Father    Cancer Father        prostate    Colon cancer Father 75   Colon polyps Maternal Uncle    Colon cancer Maternal Uncle    Cancer Maternal Grandfather        prostate   Colon polyps Paternal Grandfather    Colon cancer Paternal Grandfather    Esophageal cancer Neg Hx  Rectal cancer Neg Hx    Stomach cancer Neg Hx     Social History   Socioeconomic History   Marital status: Married    Spouse name: Not on file   Number of children: Not on file   Years of education: Not on file   Highest education level: Not on file  Occupational History   Not on file  Tobacco Use   Smoking status: Former    Current packs/day: 0.00    Types: Cigarettes    Start date: 11/19/2005    Quit date: 11/20/2015    Years since quitting: 7.9   Smokeless tobacco: Never  Vaping Use   Vaping status: Never Used  Substance and Sexual Activity   Alcohol use: No   Drug use: No   Sexual activity: Yes    Birth control/protection: None  Other Topics Concern   Not on file  Social History Narrative   Not on file   Social Determinants of Health   Financial Resource Strain: Not on file  Food Insecurity: Not on file  Transportation Needs: Not on file  Physical Activity: Not on file  Stress: Not on file  Social Connections: Unknown (05/04/2022)   Received from [facility]   Social Network    Social Network: Not on file  Intimate Partner Violence: Unknown (04/04/2022)   Received from [facility], [facility]   HITS    Physically Hurt: Not on file    Insult or Talk Down To: Not on file    Threaten Physical Harm: Not on file    Scream or Curse: Not on file    Review of  Systems:  All other review of systems negative except as mentioned in the HPI.  Physical Exam: Vital signs BP 135/76   Pulse 79   Temp (!) 97.3 F (36.3 C) (Skin)   Ht 5\' 10"  (1.778 m)   SpO2 99%   BMI 42.99 kg/m   General:   Alert,  Well-developed, well-nourished, pleasant and cooperative in NAD Lungs:  Clear throughout to auscultation.   Heart:  Regular rate and rhythm; no murmurs, clicks, rubs,  or gallops. Abdomen:  Soft, nontender and nondistended. Normal bowel sounds.   Neuro/Psych:  Alert and cooperative. Normal mood and affect. A and O x 3   @Keshana Klemz  Sena Slate, MD, [facility] [phone] (pager) 10/14/2023 2:35 PM@

## 2023-10-14 NOTE — Progress Notes (Signed)
Pt's states no medical or surgical changes since previsit or office visit. 

## 2023-10-14 NOTE — Patient Instructions (Addendum)
I removed 7 polyps today All look benign.  I will let you know pathology results and when to have another routine colonoscopy by mail and/or My Chart.  I appreciate the opportunity to care for you. Iva Boop, MD, [facility]  Please read handouts provided. Continue present medications. Await pathology results. No aspirin, ibuprofen, naproxen, or other non-steriodal anti-inflammatory drugs for 2 weeks.   YOU HAD AN ENDOSCOPIC PROCEDURE TODAY AT THE [facility]:   Refer to the procedure report that was given to you for any specific questions about what was found during the examination.  If the procedure report does not answer your questions, please call your gastroenterologist to clarify.  If you requested that your care partner not be given the details of your procedure findings, then the procedure report has been included in a sealed envelope for you to review at your convenience later.  YOU SHOULD EXPECT: Some feelings of bloating in the abdomen. Passage of more gas than usual.  Walking can help get rid of the air that was put into your GI tract during the procedure and reduce the bloating. If you had a lower endoscopy (such as a colonoscopy or flexible sigmoidoscopy) you may notice spotting of blood in your stool or on the toilet paper. If you underwent a bowel prep for your procedure, you may not have a normal bowel movement for a few days.  Please Note:  You might notice some irritation and congestion in your nose or some drainage.  This is from the oxygen used during your procedure.  There is no need for concern and it should clear up in a day or so.  SYMPTOMS TO REPORT IMMEDIATELY:  Following lower endoscopy (colonoscopy or flexible sigmoidoscopy):  Excessive amounts of blood in the stool  Significant tenderness or worsening of abdominal pains  Swelling of the abdomen that is new, acute  Fever of 100F or higher.  For urgent or emergent issues, a gastroenterologist can  be reached at any hour by calling [phone]. Do not use MyChart messaging for urgent concerns.    DIET:  We do recommend a small meal at first, but then you may proceed to your regular diet.  Drink plenty of fluids but you should avoid alcoholic beverages for 24 hours.  ACTIVITY:  You should plan to take it easy for the rest of today and you should NOT DRIVE or use heavy machinery until tomorrow (because of the sedation medicines used during the test).    FOLLOW UP: Our staff will call the number listed on your records the next business day following your procedure.  We will call around 7:15- 8:00 am to check on you and address any questions or concerns that you may have regarding the information given to you following your procedure. If we do not reach you, we will leave a message.     If any biopsies were taken you will be contacted by phone or by letter within the next 1-3 weeks.  Please call us at [phone] if you have not heard about the biopsies in 3 weeks.    SIGNATURES/CONFIDENTIALITY: You and/or your care partner have signed paperwork which will be entered into your electronic medical record.  These signatures attest to the fact that that the information above on your After Visit Summary has been reviewed and is understood.  Full responsibility of the confidentiality of this discharge information lies with you and/or your care-partner.

## 2023-10-14 NOTE — Progress Notes (Signed)
Report given to PACU, vss 

## 2023-10-14 NOTE — Telephone Encounter (Signed)
-----   Message from [facility] sent at 10/13/2023  5:11 PM EDT ----- Hi! Can we get his RUQ [place] scheduled soon since he has newly diagnosed hepatitis C? Thanks!

## 2023-10-15 ENCOUNTER — Telehealth: Payer: Self-pay

## 2023-10-15 NOTE — Telephone Encounter (Signed)
Attempted to reach patient for post-procedure f/u call. No answer. Left message for him to please not hesitate to call if he has any questions/concerns regarding his care. 

## 2023-10-16 NOTE — Progress Notes (Signed)
Called patient to schedule appointment - there was no answer with no option to leave a voicemail.   Thanks [employer]

## 2023-10-19 ENCOUNTER — Telehealth: Payer: Self-pay | Admitting: Family Medicine

## 2023-10-19 LAB — SURGICAL PATHOLOGY

## 2023-10-19 NOTE — Telephone Encounter (Addendum)
Called patient to schedule for lab appt tomorrow, 10/22 at 4:15 PM for repeat BMP in setting of increase losartan-hydrochlorothiazide.    ----- Message from Raelyn Mora sent at 10/16/2023 10:31 AM EDT -----    ----- Message ----- From: Evette Georges, MD Sent: 10/13/2023   5:12 PM EDT To: Bevelyn Ngo Admin  Can we get him scheduled for a lab appointment for repeat BMP? I have placed the order! Thanks!

## 2023-10-20 ENCOUNTER — Other Ambulatory Visit: Payer: Medicare Other

## 2023-10-20 ENCOUNTER — Encounter: Payer: Self-pay | Admitting: Internal Medicine

## 2023-10-20 DIAGNOSIS — I10 Essential (primary) hypertension: Secondary | ICD-10-CM

## 2023-10-21 LAB — BASIC METABOLIC PANEL
BUN/Creatinine Ratio: 11 (ref 9–20)
BUN: 9 mg/dL (ref 6–24)
CO2: 26 mmol/L (ref 20–29)
Calcium: 10 mg/dL (ref 8.7–10.2)
Chloride: 96 mmol/L (ref 96–106)
Creatinine, Ser: 0.82 mg/dL (ref 0.76–1.27)
Glucose: 102 mg/dL — ABNORMAL HIGH (ref 70–99)
Potassium: 3.5 mmol/L (ref 3.5–5.2)
Sodium: 138 mmol/L (ref 134–144)
eGFR: 102 mL/min/{1.73_m2} (ref >59)

## 2023-10-27 ENCOUNTER — Other Ambulatory Visit (HOSPITAL_COMMUNITY): Payer: Self-pay | Admitting: Family Medicine

## 2023-10-27 DIAGNOSIS — Z87891 Personal history of nicotine dependence: Secondary | ICD-10-CM

## 2023-10-27 DIAGNOSIS — Z122 Encounter for screening for malignant neoplasm of respiratory organs: Secondary | ICD-10-CM

## 2023-10-29 ENCOUNTER — Ambulatory Visit
Admission: RE | Admit: 2023-10-29 | Discharge: 2023-10-29 | Disposition: A | Discharge status: Home / Self Care | Payer: Medicare Other | Source: Ambulatory Visit | Attending: Family Medicine | Admitting: Family Medicine

## 2023-10-29 DIAGNOSIS — B1920 Unspecified viral hepatitis C without hepatic coma: Secondary | ICD-10-CM

## 2023-11-03 ENCOUNTER — Ambulatory Visit (HOSPITAL_COMMUNITY)
Admission: RE | Admit: 2023-11-03 | Discharge: 2023-11-03 | Disposition: A | Discharge status: Home / Self Care | Payer: Medicare Other | Source: Ambulatory Visit | Attending: Family Medicine | Admitting: Family Medicine

## 2023-11-03 DIAGNOSIS — Z122 Encounter for screening for malignant neoplasm of respiratory organs: Secondary | ICD-10-CM | POA: Insufficient documentation

## 2023-11-03 DIAGNOSIS — Z87891 Personal history of nicotine dependence: Secondary | ICD-10-CM | POA: Insufficient documentation

## 2023-11-05 ENCOUNTER — Ambulatory Visit (INDEPENDENT_AMBULATORY_CARE_PROVIDER_SITE_OTHER): Payer: Medicare Other

## 2023-11-05 VITALS — Ht 70.0 in | Wt 280.0 lb

## 2023-11-05 DIAGNOSIS — Z0000 Encounter for general adult medical examination without abnormal findings: Secondary | ICD-10-CM

## 2023-11-05 NOTE — Progress Notes (Cosign Needed Addendum)
Please co-sign and attest this AWV.   Because this visit was a virtual/telehealth visit,  certain criteria was not obtained, such a blood pressure, CBG if applicable, and timed get up and go. Any medications not marked as "taking" were not mentioned during the medication reconciliation part of the visit. Any vitals not documented were not able to be obtained due to this being a telehealth visit or patient was unable to self-report a recent blood pressure reading due to a lack of equipment at home via telehealth. Vitals that have been documented are verbally provided by the patient.   Subjective:   [name] is a 57 y.o. male who presents for an Initial Medicare Annual Wellness Visit.  Visit Complete: Virtual I connected with  [name] on 11/05/23 by a audio enabled telemedicine application and verified that I am speaking with the correct person using two identifiers.  Patient Location: Home  Provider Location: Home Office  I discussed the limitations of evaluation and management by telemedicine. The patient expressed understanding and agreed to proceed.  Vital Signs: Because this visit was a virtual/telehealth visit, some criteria may be missing or patient reported. Any vitals not documented were not able to be obtained and vitals that have been documented are patient reported.  Patient Medicare AWV questionnaire was completed by the patient on na; I have confirmed that all information answered by patient is correct and no changes since this date.  Cardiac Risk Factors include: advanced age (>14men, >61 women);hypertension;obesity (BMI >30kg/m2);male gender;sedentary lifestyle     Objective:    Today's Vitals   11/05/23 1428 11/05/23 1429  Weight: 280 lb (127 kg)   Height: 5\' 10"  (1.778 m)   PainSc:  0-No pain   Body mass index is 40.18 kg/m.     11/05/2023    2:31 PM 11/17/2022    3:04 PM 04/16/2022    1:32 PM 04/09/2022    1:56 PM 08/01/2021    1:29 PM 06/18/2021     9:24 AM 09/19/2018    1:21 PM  Advanced Directives  Does Patient Have a Medical Advance Directive? No No No No No No No  Would patient like information on creating a medical advance directive? No - Patient declined No - Patient declined No - Patient declined No - Patient declined No - Patient declined Yes (MAU/Ambulatory/Procedural Areas - Information given) No - Patient declined    Current Medications (verified) Outpatient Encounter Medications as of 11/05/2023  Medication Sig   acetaminophen (TYLENOL) 500 MG tablet Take 500 mg by mouth every 6 (six) hours as needed for moderate pain or headache.   losartan-hydrochlorothiazide (HYZAAR) 100-12.5 MG tablet Take 1 tablet by mouth daily.   XTANDI 40 MG capsule Take 160 mg by mouth daily.   aspirin EC 325 MG EC tablet Take 1 tablet (325 mg total) by mouth daily with breakfast. (Patient not taking: Reported on 09/08/2023)   clotrimazole (LOTRIMIN AF) 1 % cream Apply 1 Application topically 2 (two) times daily. Apply until resolution. (Patient not taking: Reported on 10/07/2023)   diclofenac Sodium (VOLTAREN) 1 % GEL Apply 2 g topically 4 (four) times daily. (Patient not taking: Reported on 11/05/2023)   ibuprofen (ADVIL) 200 MG tablet Take 200 mg by mouth every 6 (six) hours as needed for headache or moderate pain. (Patient not taking: Reported on 10/07/2023)   pantoprazole (PROTONIX) 20 MG tablet TAKE 1 TABLET (20 MG TOTAL) BY MOUTH DAILY. (Patient not taking: Reported on 10/14/2023)   tadalafil (  CIALIS) 20 MG tablet Take 1 tablet (20 mg total) by mouth daily as needed for up to 30 days for Erectile Dysfunction. (Patient not taking: Reported on 10/07/2023)   tamsulosin (FLOMAX) 0.4 MG CAPS capsule Take 1 capsule (0.4 mg total) by mouth daily. (Patient not taking: Reported on 10/14/2023)   No facility-administered encounter medications on file as of 11/05/2023.    Allergies (verified) Patient has no known allergies.   History: Past Medical History:   Diagnosis Date   Arthritis    Chronic bilateral low back pain with right-sided sciatica    ED (erectile dysfunction)    GERD (gastroesophageal reflux disease)    Hx of adenomatous colonic polyps 10/14/2023   2019 4 polyps max 11 mm - all adenomas 2007 7 mm polyp removed, not recovered, proctitis  10/14/2023 7 polyps max 10 mm     Hydronephrosis of right kidney    Hypertension    followed by pcp   (nuclear test study 02-28-2010 in epic, normal no ischemia, nuclear ef 51%)   Pre-diabetes    Prostate cancer (HCC)    urologist--dr newsome   -- dx 04/ 2022,   started ADT 06-14-2021   Wears glasses    Past Surgical History:  Procedure Laterality Date   COLONOSCOPY  02/04/2006   Dr.Jacobs   CYSTOSCOPY W/ URETERAL STENT PLACEMENT Bilateral 06/18/2021   Procedure: CYSTOSCOPY WITH BILATERAL RETROGRADE PYELOGRAM/ RIGHT URETERAL STENT PLACEMENT;  Surgeon: Belva Agee, MD;  Location: [facility];  Service: Urology;  Laterality: Bilateral;   LAPAROSCOPIC CHOLECYSTECTOMY  01/20/2008   @ [facility]   TOTAL KNEE ARTHROPLASTY Right 07/16/2018   Procedure: RIGHT TOTAL KNEE REPLACEMENT;  Surgeon: Eldred Manges, MD;  Location: [facility] OR;  Service: Orthopedics;  Laterality: Right;   Family History  Problem Relation Age of Onset   Colon polyps Father    Cancer Father        prostate    Colon cancer Father 33   Colon polyps Maternal Uncle    Colon cancer Maternal Uncle    Cancer Maternal Grandfather        prostate   Colon polyps Paternal Grandfather    Colon cancer Paternal Grandfather    Esophageal cancer Neg Hx    Rectal cancer Neg Hx    Stomach cancer Neg Hx    Social History   Socioeconomic History   Marital status: Married    Spouse name: Not on file   Number of children: Not on file   Years of education: Not on file   Highest education level: Not on file  Occupational History   Not on file  Tobacco Use   Smoking status: Former    Current packs/day: 0.00    Types:  Cigarettes    Start date: 11/19/2005    Quit date: 11/20/2015    Years since quitting: 7.9   Smokeless tobacco: Never  Vaping Use   Vaping status: Never Used  Substance and Sexual Activity   Alcohol use: No   Drug use: No   Sexual activity: Yes    Birth control/protection: None  Other Topics Concern   Not on file  Social History Narrative   Not on file   Social Determinants of Health   Financial Resource Strain: Low Risk  (11/05/2023)   Overall Financial Resource Strain (CARDIA)    Difficulty of Paying Living Expenses: Not hard at all  Food Insecurity: No Food Insecurity (11/05/2023)   Hunger Vital Sign    Worried  About Running Out of Food in the Last Year: Never true    Ran Out of Food in the Last Year: Never true  Transportation Needs: No Transportation Needs (11/05/2023)   PRAPARE - Administrator, Civil Service (Medical): No    Lack of Transportation (Non-Medical): No  Physical Activity: Sufficiently Active (11/05/2023)   Exercise Vital Sign    Days of Exercise per Week: 7 days    Minutes of Exercise per Session: 30 min  Stress: No Stress Concern Present (11/05/2023)   [employer] of Occupational Health - Occupational Stress Questionnaire    Feeling of Stress : Not at all  Social Connections: Moderately Integrated (11/05/2023)   Social Connection and Isolation Panel [NHANES]    Frequency of Communication with Friends and Family: More than three times a week    Frequency of Social Gatherings with Friends and Family: More than three times a week    Attends Religious Services: More than 4 times per year    Active Member of [employer] or Organizations: No    Attends Engineer, structural: Never    Marital Status: Married    Tobacco Counseling Counseling given: Yes   Clinical Intake:  Pre-visit preparation completed: Yes  Pain : No/denies pain Pain Score: 0-No pain     BMI - recorded: 40.18 Nutritional Risks: None Diabetes: No  How often do  you need to have someone help you when you read instructions, pamphlets, or other written materials from your doctor or pharmacy?: 1 - Never  Interpreter Needed?: No  Information entered by :: Abby Jaylyne Breese, CMA   Activities of Daily Living    11/05/2023    2:29 PM  In your present state of health, do you have any difficulty performing the following activities:  Hearing? 0  Vision? 0  Comment patient is utd with yearly eye exams  Difficulty concentrating or making decisions? 0  Walking or climbing stairs? 0  Doing errands, shopping? 0  Preparing Food and eating ? N  Using the Toilet? N  In the past six months, have you accidently leaked urine? N  Do you have problems with loss of bowel control? N  Managing your Medications? N  Managing your Finances? N  Housekeeping or managing your Housekeeping? N    Patient Care Team: Evette Georges, MD as PCP - General (Family Medicine)  Indicate any recent Medical Services you may have received from other than [facility] providers in the past year (date may be approximate).     Assessment:   This is a routine wellness examination for [place].  Hearing/Vision screen Hearing Screening - Comments:: Patient denies any hearing difficulties.   Vision Screening - Comments:: Patient is up to date with yearly eye exams but states he doesn't really have an eye doctor. Declines referral today. Had eye exam at [facility] during the summer   Goals Addressed             This Visit's Progress    Patient Stated       To be "healed"       Depression Screen    11/05/2023    2:35 PM 10/13/2023    2:33 PM 11/17/2022    3:06 PM 04/09/2022    1:39 PM 01/29/2022   11:21 AM 08/01/2021    1:28 PM 04/24/2021   11:58 AM  PHQ 2/9 Scores  PHQ - 2 Score 0 1 6 2  0 2 0  PHQ- 9 Score 0 5 21 7  8     Fall Risk    11/05/2023    2:32 PM 09/08/2023    1:32 PM 01/29/2022   11:21 AM 04/24/2021   11:03 AM 01/23/2021    1:32 PM  Fall Risk   Falls in the past year?  0 0 0 0 0  Number falls in past yr: 0 0 0 0   Injury with Fall? 0  0 0   Risk for fall due to : No Fall Risks   No Fall Risks   Follow up Falls prevention discussed   Falls evaluation completed Falls evaluation completed    MEDICARE RISK AT HOME: Medicare Risk at Home Any stairs in or around the home?: No If so, are there any without handrails?: No Home free of loose throw rugs in walkways, pet beds, electrical cords, etc?: Yes Adequate lighting in your home to reduce risk of falls?: Yes Life alert?: No Use of a cane, walker or w/c?: No Grab bars in the bathroom?: Yes Shower chair or bench in shower?: Yes Elevated toilet seat or a handicapped toilet?: No  TIMED UP AND GO:  Was the test performed? No    Cognitive Function:        11/05/2023    2:33 PM  6CIT Screen  What Year? 0 points  What month? 0 points  What time? 0 points  Count back from 20 0 points  Months in reverse 0 points  Repeat phrase 0 points  Total Score 0 points    Immunizations Immunization History  Administered Date(s) Administered   Influenza, Seasonal, Injecte, Preservative Fre 09/08/2023   Influenza,inj,Quad PF,6+ Mos 10/09/2017, 10/07/2018, 08/29/2019   PFIZER(Purple Top)SARS-COV-2 Vaccination 02/20/2020, 03/15/2020   Pfizer(Comirnaty)Fall Seasonal Vaccine 12 years and older 09/08/2023   Tdap 01/02/2017    TDAP status: Up to date  Flu Vaccine status: Up to date  Pneumococcal vaccine status: Not age appropriate for this patient.   Covid-19 vaccine status: Information provided on how to obtain vaccines.   Qualifies for Shingles Vaccine? Yes   Zostavax completed No   Shingrix Completed?: No.    Education has been provided regarding the importance of this vaccine. Patient has been advised to call insurance company to determine out of pocket expense if they have not yet received this vaccine. Advised may also receive vaccine at local pharmacy or Health Dept. Verbalized acceptance and  understanding.  Screening Tests Health Maintenance  Topic Date Due   Medicare Annual Wellness (AWV)  Never done   Zoster Vaccines- Shingrix (1 of 2) Never done   Lung Cancer Screening  Never done   COVID-19 Vaccine (4 - 2023-24 season) 11/03/2023   Colonoscopy  10/13/2026   DTaP/Tdap/Td (2 - Td or Tdap) 01/02/2027   INFLUENZA VACCINE  Completed   Hepatitis C Screening  Completed   HIV Screening  Completed   HPV VACCINES  Aged Out    Health Maintenance  Health Maintenance Due  Topic Date Due   Medicare Annual Wellness (AWV)  Never done   Zoster Vaccines- Shingrix (1 of 2) Never done   Lung Cancer Screening  Never done   COVID-19 Vaccine (4 - 2023-24 season) 11/03/2023    Colorectal cancer screening: Type of screening: Colonoscopy. Completed 10/14/2023. Repeat every 3 years  Lung Cancer Screening: (Low Dose CT Chest recommended if Age 7-80 years, 20 pack-year currently smoking OR have quit w/in 15years.) does qualify.   Lung Cancer Screening Referral: Patient had screening performed on 11/03/2023  Additional Screening:  Hepatitis C Screening: does not qualify; Completed 10/29/2023  Vision Screening: Recommended annual ophthalmology exams for early detection of glaucoma and other disorders of the eye. Is the patient up to date with their annual eye exam?  Yes  Who is the provider or what is the name of the office in which the patient attends annual eye exams? Last exam at [facility] If pt is not established with a provider, would they like to be referred to a provider to establish care? No .   Dental Screening: Recommended annual dental exams for proper oral hygiene  Diabetic Foot Exam: na  Community Resource Referral / Chronic Care Management: CRR required this visit?  No   CCM required this visit?  No    Plan:     I have personally reviewed and noted the following in the patient's chart:   Medical and social history Use of alcohol, tobacco or illicit drugs   Current medications and supplements including opioid prescriptions. Patient is not currently taking opioid prescriptions. Functional ability and status Nutritional status Physical activity Advanced directives List of other physicians Hospitalizations, surgeries, and ER visits in previous 12 months Vitals Screenings to include cognitive, depression, and falls Referrals and appointments  In addition, I have reviewed and discussed with patient certain preventive protocols, quality metrics, and best practice recommendations. A written personalized care plan for preventive services as well as general preventive health recommendations were provided to patient.     Jordan Hawks Tiquan Bouch, CMA   11/05/2023   After Visit Summary: (MyChart) Due to this being a telephonic visit, the after visit summary with patients personalized plan was offered to patient via MyChart   Nurse Notes: none

## 2023-11-05 NOTE — Patient Instructions (Signed)
[name] , Thank you for taking time to come for your Medicare Wellness Visit. I appreciate your ongoing commitment to your health goals. Please review the following plan we discussed and let me know if I can assist you in the future.   Referrals/Orders/Follow-Ups/Clinician Recommendations:  Next Medicare Annual Wellness Visit: November 07, 2024 at 10:00 am virtual visit  You are due for the vaccines checked below. You may have these done at your preferred pharmacy. Please have them fax the office proof of the vaccines so that we can update your chart.   []  Flu (due annually)  Recommended this fall either at PCP office or through your local pharmacy. The flu season starts August 1 of each year.   [x]  Shingrix (Shingles vaccine): CDC recommends 2 doses of Shingrix separated by 2-6 months for aged 58 years and older:  []  Pneumonia Vaccines: Recommended for adults 65 years or older  []  TDAP (Tetanus) Vaccine every 10 years:Recommended every 10 years; Please call your insurance company to determine your out of pocket expense. You also receive this vaccine at your local pharmacy or Health Dept.  []  Covid-19: Available now at any [facility] pharmacy (see info below)  You may also get your vaccines at any [facility] (locations listed below.) Vaccine hours are Monday - Friday 9:00 - 4:00. No appointments are required. Most insurances are accepted including Medicaid. Anyone can use the community pharmacies, and people are not required to have a [facility] provider.  Community Pharmacy Locations offering vaccines:   Sport and exercise psychologist   [facility]  10 vaccines are offered at the [employer]: Covid, flu, Tdap, shingles, RSV, pneumonia, meningococcal, hepatitis A, hepatitis B, and HPV.    This is a list of the screening recommended for you and due dates:  Health Maintenance   Topic Date Due   Zoster (Shingles) Vaccine (1 of 2) Never done   Screening for Lung Cancer  Never done   COVID-19 Vaccine (4 - 2023-24 season) 11/03/2023   Medicare Annual Wellness Visit  11/04/2024   Colon Cancer Screening  10/13/2026   DTaP/Tdap/Td vaccine (2 - Td or Tdap) 01/02/2027   Flu Shot  Completed   Hepatitis C Screening  Completed   HIV Screening  Completed   HPV Vaccine  Aged Out    Advanced directives: (Declined) Advance directive discussed with you today. Even though you declined this today, please call our office should you change your mind, and we can give you the proper paperwork for you to fill out.  Next Medicare Annual Wellness Visit scheduled for next year: Yes Preventive Care 16-36 Years Old, Male Preventive care refers to lifestyle choices and visits with your health care provider that can promote health and wellness. Preventive care visits are also called wellness exams. What can I expect for my preventive care visit? Counseling During your preventive care visit, your health care provider may ask about your: Medical history, including: Past medical problems. Family medical history. Current health, including: Emotional well-being. Home life and relationship well-being. Sexual activity. Lifestyle, including: Alcohol, nicotine or tobacco, and drug use. Access to firearms. Diet, exercise, and sleep habits. Safety issues such as seatbelt and bike helmet use. Sunscreen use. Work and work Astronomer. Physical exam Your health care provider will check your: Height and weight. These may be used to calculate your BMI (body mass index). BMI is a measurement  that tells if you are at a healthy weight. Waist circumference. This measures the distance around your waistline. This measurement also tells if you are at a healthy weight and may help predict your risk of certain diseases, such as type 2 diabetes and high blood pressure. Heart rate and blood pressure. Body  temperature. Skin for abnormal spots. What immunizations do I need?  Vaccines are usually given at various ages, according to a schedule. Your health care provider will recommend vaccines for you based on your age, medical history, and lifestyle or other factors, such as travel or where you work. What tests do I need? Screening Your health care provider may recommend screening tests for certain conditions. This may include: Lipid and cholesterol levels. Diabetes screening. This is done by checking your blood sugar (glucose) after you have not eaten for a while (fasting). Hepatitis B test. Hepatitis C test. HIV (human immunodeficiency virus) test. STI (sexually transmitted infection) testing, if you are at risk. Lung cancer screening. Prostate cancer screening. Colorectal cancer screening. Talk with your health care provider about your test results, treatment options, and if necessary, the need for more tests. Follow these instructions at home: Eating and drinking  Eat a diet that includes fresh fruits and vegetables, whole grains, lean protein, and low-fat dairy products. Take vitamin and mineral supplements as recommended by your health care provider. Do not drink alcohol if your health care provider tells you not to drink. If you drink alcohol: Limit how much you have to 0-2 drinks a day. Know how much alcohol is in your drink. In the [place] one drink equals one 12 oz bottle of beer (355 mL), one 5 oz glass of wine (148 mL), or one 1 oz glass of hard liquor (44 mL). Lifestyle Brush your teeth every morning and night with fluoride toothpaste. Floss one time each day. Exercise for at least 30 minutes 5 or more days each week. Do not use any products that contain nicotine or tobacco. These products include cigarettes, chewing tobacco, and vaping devices, such as e-cigarettes. If you need help quitting, ask your health care provider. Do not use drugs. If you are sexually active,  practice safe sex. Use a condom or other form of protection to prevent STIs. Take aspirin only as told by your health care provider. Make sure that you understand how much to take and what form to take. Work with your health care provider to find out whether it is safe and beneficial for you to take aspirin daily. Find healthy ways to manage stress, such as: Meditation, yoga, or listening to music. Journaling. Talking to a trusted person. Spending time with friends and family. Minimize exposure to UV radiation to reduce your risk of skin cancer. Safety Always wear your seat belt while driving or riding in a vehicle. Do not drive: If you have been drinking alcohol. Do not ride with someone who has been drinking. When you are tired or distracted. While texting. If you have been using any mind-altering substances or drugs. Wear a helmet and other protective equipment during sports activities. If you have firearms in your house, make sure you follow all gun safety procedures. What's next? Go to your health care provider once a year for an annual wellness visit. Ask your health care provider how often you should have your eyes and teeth checked. Stay up to date on all vaccines. This information is not intended to replace advice given to you by your health care provider. Make sure  you discuss any questions you have with your health care provider. Document Revised: 06/12/2021 Document Reviewed: 06/12/2021 Elsevier Patient Education  2024 [employer].

## 2023-11-17 ENCOUNTER — Telehealth: Payer: Self-pay | Admitting: Family Medicine

## 2023-11-17 DIAGNOSIS — B1920 Unspecified viral hepatitis C without hepatic coma: Secondary | ICD-10-CM

## 2023-11-17 NOTE — Telephone Encounter (Signed)
Called to discuss right upper quadrant ultrasound with elastography results with patient.  Informed of hepatic steatosis and evidence of cirrhosis.  Cirrhosis likely due to hepatitis C.  Given this, will refer to infectious disease for hepatitis C treatment.  Informed patient to let us know within 2 weeks if he has not heard anything for this referral.  Also advised to contact our office with any questions with process.

## 2023-11-23 ENCOUNTER — Other Ambulatory Visit: Payer: Self-pay

## 2023-11-23 ENCOUNTER — Other Ambulatory Visit: Payer: Self-pay | Admitting: Family Medicine

## 2023-11-23 MED ORDER — LOSARTAN POTASSIUM-HCTZ 100-12.5 MG PO TABS
1.0000 | ORAL_TABLET | Freq: Every day | ORAL | 0 refills | Status: DC
  Filled 2023-11-23: qty 90, 90d supply, fill #0

## 2023-11-25 ENCOUNTER — Other Ambulatory Visit: Payer: Self-pay

## 2023-12-04 ENCOUNTER — Other Ambulatory Visit (HOSPITAL_COMMUNITY): Payer: Self-pay

## 2023-12-04 ENCOUNTER — Telehealth: Payer: Self-pay

## 2023-12-04 NOTE — Telephone Encounter (Signed)
[facility] Patient Advocate Encounter  Insurance verification completed.    The patient is insured through [employer]. Patient has Medicare and is not eligible for a copay card, but may be able to apply for patient assistance, if available.    Ran test claim for MAVYRET & EPCLUSA Medication will need a PA.  We will continue to follow to see if copay assistance is needed.  This test claim was processed through [facility]- copay amounts may vary at other pharmacies due to pharmacy/plan contracts, or as the patient moves through the different stages of their insurance plan.

## 2023-12-07 ENCOUNTER — Encounter: Payer: Self-pay | Admitting: Infectious Diseases

## 2023-12-07 ENCOUNTER — Other Ambulatory Visit: Payer: Self-pay

## 2023-12-07 ENCOUNTER — Ambulatory Visit (INDEPENDENT_AMBULATORY_CARE_PROVIDER_SITE_OTHER): Payer: Medicare Other | Admitting: Infectious Diseases

## 2023-12-07 VITALS — BP 148/85 | HR 86 | Temp 98.0°F | Resp 16 | Ht 70.0 in | Wt 299.0 lb

## 2023-12-07 DIAGNOSIS — B182 Chronic viral hepatitis C: Secondary | ICD-10-CM

## 2023-12-07 NOTE — Patient Instructions (Signed)
Please stop by the lab on your way out.   I may need to speak with your oncologist Dr. Thana Ates about one of your other medications once we get ready to start treatment. We may need to talk about holding your Xtandi when we get your hepatitis c treated.   Will call you when your blood work returns and we can get your plan ready.

## 2023-12-07 NOTE — Progress Notes (Signed)
Patient Name: [name]  Date of Birth: [birth year]  MRN: [id number]  PCP: Evette Georges, MD  Referring Provider: Westley Chandler, MD, Ph#: [phone]   Subjective   Subjective:  CC: New patient - initial evaluation and management of chronic hepatitis C infection.   Discussed the use of AI scribe software for clinical note transcription with the patient, who gave verbal consent to proceed.  History of Present Illness   Void, a patient with a recent diagnosis of chronic Hepatitis C, presents for a consultation regarding treatment options. The diagnosis was unexpected and came as a result of routine screening following abnormal liver function tests. Obama reports no prior testing for Hepatitis C and was unaware of the infection until recently. In terms of lifestyle, Dolphus reports no current alcohol or drug use, although he admits to past usage during his younger years as well as distant remote hx injection drugs. He is sexually active with his wife, who has not been tested for Hepatitis C.  The patient has also been informed of potential cirrhosis, as indicated by an ultrasound that was taken recently (Nov 2024). He denies significant leg swelling or abdominal bloating. Pearlie has not experienced any physical symptoms commonly associated with Hepatitis C or advanced liver damage. However, he occasionally experiences unexplained abdominal pain and back pain.   Daimon's current medication regimen includes Xtandi and Losartan, which he takes regularly, albeit sometimes forgetting due to not being home at the expected time of administration. He also takes over-the-counter Prilosec for occasional stomach acid issues and notes he needs it frequently.   He is eager to begin treatment for his Hepatitis C and is open to further testing to facilitate this process.   He has a history of prostate cancer in remission - following with Thana Ates at [facility]: [phone] and has been  taking Xtandi.       Review of Systems  All other systems reviewed and are negative.   Past Medical History:  Diagnosis Date   Arthritis    Chronic bilateral low back pain with right-sided sciatica    ED (erectile dysfunction)    GERD (gastroesophageal reflux disease)    Hx of adenomatous colonic polyps 10/14/2023   2019 4 polyps max 11 mm - all adenomas 2007 7 mm polyp removed, not recovered, proctitis  10/14/2023 7 polyps max 10 mm     Hydronephrosis of right kidney    Hypertension    followed by pcp   (nuclear test study 02-28-2010 in epic, normal no ischemia, nuclear ef 51%)   Pre-diabetes    Prostate cancer (HCC)    urologist--dr newsome   -- dx 04/ 2022,   started ADT 06-14-2021   Wears glasses     Outpatient Medications Prior to Visit  Medication Sig Dispense Refill   acetaminophen (TYLENOL) 500 MG tablet Take 500 mg by mouth every 6 (six) hours as needed for moderate pain or headache.     losartan-hydrochlorothiazide (HYZAAR) 100-12.5 MG tablet Take 1 tablet by mouth daily. 90 tablet 0   XTANDI 40 MG capsule Take 160 mg by mouth daily.     aspirin EC 325 MG EC tablet Take 1 tablet (325 mg total) by mouth daily with breakfast. (Patient not taking: Reported on 09/08/2023) 30 tablet 0   clotrimazole (LOTRIMIN AF) 1 % cream Apply 1 Application topically 2 (two) times daily. Apply until resolution. (Patient not taking: Reported on 10/07/2023) 60 g 0   diclofenac Sodium (VOLTAREN) 1 %  GEL Apply 2 g topically 4 (four) times daily. (Patient not taking: Reported on 11/05/2023) 150 g 3   ibuprofen (ADVIL) 200 MG tablet Take 200 mg by mouth every 6 (six) hours as needed for headache or moderate pain. (Patient not taking: Reported on 10/07/2023)     pantoprazole (PROTONIX) 20 MG tablet TAKE 1 TABLET (20 MG TOTAL) BY MOUTH DAILY. (Patient not taking: Reported on 10/14/2023) 90 tablet 3   tadalafil (CIALIS) 20 MG tablet Take 1 tablet (20 mg total) by mouth daily as needed for up to 30  days for Erectile Dysfunction. (Patient not taking: Reported on 10/07/2023) 10 tablet 3   tamsulosin (FLOMAX) 0.4 MG CAPS capsule Take 1 capsule (0.4 mg total) by mouth daily. (Patient not taking: Reported on 10/14/2023) 360 capsule 0   No facility-administered medications prior to visit.     No Known Allergies  Social History   Tobacco Use   Smoking status: Former    Current packs/day: 0.00    Types: Cigarettes    Start date: 11/19/2005    Quit date: 11/20/2015    Years since quitting: 8.0   Smokeless tobacco: Never  Vaping Use   Vaping status: Never Used  Substance Use Topics   Alcohol use: No   Drug use: No    Family History  Problem Relation Age of Onset   Colon polyps Father    Cancer Father        prostate    Colon cancer Father 43   Colon polyps Maternal Uncle    Colon cancer Maternal Uncle    Cancer Maternal Grandfather        prostate   Colon polyps Paternal Grandfather    Colon cancer Paternal Grandfather    Esophageal cancer Neg Hx    Rectal cancer Neg Hx    Stomach cancer Neg Hx        Objective   Objective:   Vitals:   12/07/23 1448  BP: (!) 148/85  Pulse: 86  Resp: 16  Temp: 98 F (36.7 C)  TempSrc: Oral  SpO2: 97%  Weight: 299 lb (135.6 kg)  Height: 5\' 10"  (1.778 m)   Body mass index is 42.9 kg/m.  Constitutional: in no apparent distress, alert, and anicteric Eyes: anicteric Cardiovascular: Cor RRR Respiratory: clear Gastrointestinal: Bowel sounds are normal, liver is not enlarged, spleen is not enlarged Musculoskeletal: peripheral pulses normal, no pedal edema, no clubbing or cyanosis Skin: negative for - jaundice, spider hemangioma, telangiectasia, palmar erythema, ecchymosis and atrophy; no porphyria cutanea tarda Lymphatic: no cervical lymphadenopathy   Significant Imaging:  Elastography in November 2023 with Heterogeneously increased in echogenicity. Portal vein is patent on color Doppler imaging with normal direction of  blood flow towards the liver. Median kPa: 40.8 (highly suggestive of cACLD with possible portal HTN).  Focal liver lesion noted on previous CT scans (last 2022) not noted on dedicated chest CT scan in 2024.     Assessment & Plan:      Chronic Hepatitis C -  Newly diagnosed with evidence of liver inflammation and potential cirrhosis on ultrasound. Very remote history of injection drug use and multiple sexual partners in his younger years > 25 years ago. No physical symptoms reported. He does not drink alcohol.  -Hep C genotype, PT/INR -Significant drug interactions noted with all DAA's and his Xstandi for prostate Ca treatment. Will need to d/w oncology team about holding this.  -Advise patient to return three months after the last pill for follow-up  and to confirm cure of the infection.   Cirrhosis / Advanced Fibrosis -  On elastography obtained 3 weeks ago it shows mkPa of 40 indicating high probability of liver disease. LFTs were normal range recently and has no thrombocytopenia. Seems well compensated; will complete synthetic liver function with PT/INR and AFP to determine if we should proceed with MRI screen prior to treatment. He on previous CT scans had a mention of liver lesion that was not characterized on MRI but has since not been seen on LDCT scan follow ups for lung cancer screening.  -AFP (if elevated will need MRI w/ history of liver lesion in 2022).  -PT/INR  -Fibrotest   Prostate Cancer, Remission -  Maintained on Xtandi with oncology. Need to discuss holding this during HCV treatment to increase chance of cure. D/W pharmacy team and we both agree that the we should not co-administer them d/t significantly decreased levels of hep c treatment increasing significantly his failure risk.   General Health Maintenance -Advise patient's wife to get tested for Hepatitis C due to small risk of sexual transmission. -Flu vaccination already received for the year.       Orders Placed  This Encounter  Procedures   Hepatitis C genotype   Liver Fibrosis, FibroTest-ActiTest   Hepatitis B surface antigen   Protime-INR   AFP tumor marker    No orders of the defined types were placed in this encounter.   Return in about 2 months (around 02/07/2024).   Total Encounter Time: 62 minutes including face to face discussion, time spent in chart review including multiple labs and diagnostics (serial CT images) and in coordination of care with oncology provider team.    Rexene Alberts, MSN, NP-C [facility]  [email or web address] Pager: [phone] Office: [phone] [facility] Main Line: [phone] *Secure Chat Communication Welcome

## 2023-12-09 ENCOUNTER — Telehealth: Payer: Self-pay

## 2023-12-09 NOTE — Telephone Encounter (Signed)
Received call from April Pa with [facility] regarding patient enzalutamide Diana Eves) 40 mg capsule; states they are okay with holding on this while patient starts Hep C treatment. Would like to know when patient start Hep C treatment to make sure he is not taking it while being treated  Before patient can restart oncology medication will need repeat PSA.  P: [phone]   Juanita Laster, RMA

## 2023-12-11 NOTE — Telephone Encounter (Signed)
Adding Marchelle Folks too, just in case.   Will do!

## 2023-12-14 NOTE — Progress Notes (Signed)
Genotype 1a, F4 chronic hepatitis C infection. Child Pugh A   Looking at either Harvoni (preferred) x 8 weeks or Epclusa x 12 weeks - whichever is insurance will approve.  Shorter course preferred as we have to hold his prostate cancer treatment, Xstandi, during hepatitis C treatment.   Thank you !

## 2023-12-15 ENCOUNTER — Telehealth: Payer: Self-pay

## 2023-12-15 ENCOUNTER — Other Ambulatory Visit (HOSPITAL_COMMUNITY): Payer: Self-pay

## 2023-12-15 NOTE — Telephone Encounter (Signed)
[facility] Patient Advocate Encounter   Received notification from [facility] that prior authorization for Harvoni is required.   PA submitted on 12/15/23 Key [id number] Status is pending    [facility] will continue to follow.   Clearance Coots, CPhT Specialty Pharmacy Patient [facility] Phone: [phone] Fax:  [phone]

## 2023-12-16 LAB — PROTIME-INR
INR: 0.9
Prothrombin Time: 9.7 s (ref 9.0–11.5)

## 2023-12-16 LAB — AFP TUMOR MARKER: AFP-Tumor Marker: 6.4 ng/mL — ABNORMAL HIGH (ref <6.1)

## 2023-12-16 LAB — HEPATITIS C GENOTYPE

## 2023-12-16 LAB — LIVER FIBROSIS, FIBROTEST-ACTITEST
ALT: 32 U/L (ref 9–46)
Alpha-2-Macroglobulin: 219 mg/dL (ref 106–279)
Apolipoprotein A1: 232 mg/dL — ABNORMAL HIGH (ref 94–176)
Bilirubin: 0.4 mg/dL (ref 0.2–1.2)
Fibrosis Score: 0.16
GGT: 69 U/L (ref 3–85)
Haptoglobin: 125 mg/dL (ref 43–212)
Necroinflammat ACT Score: 0.13
Reference ID: 5251423

## 2023-12-16 LAB — HEPATITIS B SURFACE ANTIGEN: Hepatitis B Surface Ag: NONREACTIVE

## 2023-12-17 ENCOUNTER — Other Ambulatory Visit: Payer: Self-pay

## 2023-12-17 ENCOUNTER — Other Ambulatory Visit (HOSPITAL_COMMUNITY): Payer: Self-pay

## 2023-12-17 ENCOUNTER — Telehealth: Payer: Self-pay | Admitting: Pharmacist

## 2023-12-17 ENCOUNTER — Telehealth: Payer: Self-pay

## 2023-12-17 DIAGNOSIS — B182 Chronic viral hepatitis C: Secondary | ICD-10-CM

## 2023-12-17 MED ORDER — HARVONI 90-400 MG PO TABS
1.0000 | ORAL_TABLET | Freq: Every day | ORAL | 1 refills | Status: AC
  Filled 2023-12-17: qty 28, 28d supply, fill #0
  Filled 2024-01-05: qty 28, 28d supply, fill #1

## 2023-12-17 NOTE — Telephone Encounter (Signed)
Sent script! Please keep [place] informed closely about when he starts Harvoni - he will need to hold one of his medications while taking Harvoni. Thanks!

## 2023-12-17 NOTE — Progress Notes (Signed)
Specialty Pharmacy Initial Fill Coordination Note  [name] is a 57 y.o. male contacted today regarding initial fill of specialty medication(s) Ledipasvir-Sofosbuvir [name])   Patient requested Delivery   Delivery date: 12/21/23   Verified address: Patient address [address]  [name] [address]   Medication will be filled on 12/18/23.   Patient is aware of 0.00 copayment.

## 2023-12-17 NOTE — Telephone Encounter (Signed)
Patient's Harvoni will be mailed out tomorrow, so he will receive it either Friday or Monday. Reviewed major counseling points with him and discussed that he will need to hold Xtandi while taking Harvoni. Thankfully, it does not present a significant drug interaction as compared to [place]. Discussed that I will share this information with his [facility] team and call him back about how long he should hold Xtandi before starting Harvoni.   Spoke with team member at [facility] who stated she sent a message to his PA April to discuss the length of holding Xtandi before starting Harvoni. Will await returned call.   Margarite Gouge, PharmD, CPP, BCIDP, AAHIVP Clinical Pharmacist Practitioner Infectious Diseases Clinical Pharmacist [facility]

## 2023-12-17 NOTE — Addendum Note (Signed)
Addended by: Jennette Kettle on: 12/17/2023 01:31 PM   Modules accepted: Orders

## 2023-12-17 NOTE — Telephone Encounter (Signed)
Received notification from [facility] regarding a prior authorization for Harvoni (Brand Name). Authorization has been APPROVED from 12/15/23 to 03/08/24.   Per test claim, copay for 28 days supply is $0.00  Patient can fill through [facility]: [phone]   Authorization # [id number] Phone # [phone]

## 2023-12-18 ENCOUNTER — Other Ambulatory Visit: Payer: Self-pay | Admitting: Pharmacist

## 2023-12-18 ENCOUNTER — Other Ambulatory Visit: Payer: Self-pay

## 2023-12-18 NOTE — Telephone Encounter (Signed)
They haven't called me back today, and we're of course closing soon. I called [name] and told him to hold off on starting his Harvoni until he hears from [place] again. Adding Cassie on to this again in case the [facility] team calls back on Monday. Explained to him there is no rush in starting the Harvoni since he is still taking his Xtandi right now.

## 2023-12-18 NOTE — Progress Notes (Signed)
Specialty Pharmacy Initiation Note   [name] is a 57 y.o. male who will be followed by the specialty pharmacy service for RxSp Hepatitis C    Review of administration, indication, effectiveness, safety, potential side effects, storage/disposable, and missed dose instructions occurred today for patient's specialty medication(s) Ledipasvir-Sofosbuvir (Harvoni)     Patient/Caregiver did not have any additional questions or concerns.   Patient's therapy is appropriate to: Initiate    Goals Addressed             This Visit's Progress    Achieve virologic cure as evidenced by SVR       Patient is initiating therapy. Patient will be evaluated at upcoming provider appointment to assess progress      Comply with lab assessments       Patient is initiating therapy. Patient will adhere to provider and/or lab appointments      Maintain optimal adherence to therapy       Patient is initiating therapy. Patient will maintain adherence         Jennette Kettle Specialty Pharmacist

## 2023-12-31 NOTE — Telephone Encounter (Signed)
 Still have not heard back from [facility], but I spoke with [name] today over the phone about the plan. He states that someone from Dr. Fairy McCormick's office called him and said to stop the Xtandi , wait two days, then start the Harvoni . He has now started the Harvoni . - Alan

## 2024-01-01 ENCOUNTER — Other Ambulatory Visit (HOSPITAL_COMMUNITY): Payer: Self-pay

## 2024-01-05 ENCOUNTER — Other Ambulatory Visit: Payer: Self-pay

## 2024-01-05 NOTE — Progress Notes (Signed)
 Specialty Pharmacy Refill Coordination Note  [name] is a 58 y.o. male contacted today regarding refills of specialty medication(s) Ledipasvir -Sofosbuvir  (Harvoni )   Patient requested Delivery   Delivery date: 01/11/24   Verified address: [address]   [place] [address]   Medication will be filled on 01/08/24.

## 2024-01-07 ENCOUNTER — Other Ambulatory Visit: Payer: Self-pay

## 2024-01-07 NOTE — Progress Notes (Signed)
 01/09/25[name] - CMA  Patient has called back and is now aware of medication being shipped 01/09 to be delivered 01/10. Patient was very kind and understanding.

## 2024-01-20 ENCOUNTER — Ambulatory Visit: Payer: Medicare Other | Admitting: Pharmacist

## 2024-01-20 ENCOUNTER — Other Ambulatory Visit (HOSPITAL_COMMUNITY): Payer: Self-pay

## 2024-01-26 NOTE — Progress Notes (Unsigned)
HPI: [name] is a 58 y.o. male who presents to the [facility] pharmacy clinic for Hepatitis C follow-up.  Medication: Harvoni x8 weeks  Start Date: 12/21/23  Hepatitis C Genotype: 1a  Fibrosis Score: F0  Hepatitis C RNA: 16.9 million from 09/08/23  Patient Active Problem List   Diagnosis Date Noted   Hx of adenomatous colonic polyps 10/14/2023   Hepatitis C virus infection without hepatic coma 10/13/2023   Dyspnea on exertion 09/08/2023   Depression, major, single episode, severe (HCC) 11/18/2022   Fall 11/18/2022   Seborrheic keratosis 04/10/2022   Chronic low back pain without sciatica 04/10/2022   A gamma beta+ HPFH and beta 0 thalassemia in [facility]) 01/29/2022   Prostate cancer (HCC) 01/29/2022   Encounter to establish care 08/02/2021   Erectile dysfunction 02/27/2020   Back pain without sciatica 02/27/2020   Class 3 severe obesity due to excess calories with serious comorbidity and body mass index (BMI) of 40.0 to 44.9 in adult (HCC) 02/27/2020   Status post total knee replacement, right 10/15/2018   Arthritis of right knee 07/16/2018   Prediabetes 01/02/2017   Essential hypertension 09/04/2016    Patient's Medications  New Prescriptions   No medications on file  Previous Medications   ACETAMINOPHEN (TYLENOL) 500 MG TABLET    Take 500 mg by mouth every 6 (six) hours as needed for moderate pain or headache.   ASPIRIN EC 325 MG EC TABLET    Take 1 tablet (325 mg total) by mouth daily with breakfast.   CLOTRIMAZOLE (LOTRIMIN AF) 1 % CREAM    Apply 1 Application topically 2 (two) times daily. Apply until resolution.   DICLOFENAC SODIUM (VOLTAREN) 1 % GEL    Apply 2 g topically 4 (four) times daily.   HARVONI 90-400 MG TABS    Take 1 tablet by mouth daily.   IBUPROFEN (ADVIL) 200 MG TABLET    Take 200 mg by mouth every 6 (six) hours as needed for headache or moderate pain.   LOSARTAN-HYDROCHLOROTHIAZIDE (HYZAAR) 100-12.5 MG TABLET    Take 1 tablet by mouth daily.    PANTOPRAZOLE (PROTONIX) 20 MG TABLET    TAKE 1 TABLET (20 MG TOTAL) BY MOUTH DAILY.   TADALAFIL (CIALIS) 20 MG TABLET    Take 1 tablet (20 mg total) by mouth daily as needed for up to 30 days for Erectile Dysfunction.   TAMSULOSIN (FLOMAX) 0.4 MG CAPS CAPSULE    Take 1 capsule (0.4 mg total) by mouth daily.   XTANDI 40 MG CAPSULE    Take 160 mg by mouth daily.  Modified Medications   No medications on file  Discontinued Medications   No medications on file    Labs: Hepatitis C Lab Results  Component Value Date   HCVGENOTYPE 1a 12/07/2023   FIBROSTAGE F0 12/07/2023   Hepatitis B Lab Results  Component Value Date   HEPBSAG NON-REACTIVE 12/07/2023   HEPBCAB Negative 10/13/2023   Hepatitis A Lab Results  Component Value Date   HAV Negative 10/13/2023   HIV Lab Results  Component Value Date   HIV Non Reactive 10/13/2023   HIV NON REACTIVE 02/27/2010   HIV NON REAC 04/21/2008   Lab Results  Component Value Date   CREATININE 0.82 10/20/2023   CREATININE 1.21 09/08/2023   CREATININE 1.07 11/17/2022   CREATININE 0.75 (L) 02/03/2022   CREATININE 1.10 06/18/2021   Lab Results  Component Value Date   AST 20 09/08/2023   AST 24 11/17/2022  AST 19 02/03/2022   ALT 32 12/07/2023   ALT 30 09/08/2023   ALT 41 11/17/2022   INR 0.9 12/07/2023   INR 0.9 10/13/2023   INR 0.96 07/06/2018    Assessment: [name] presents today for his 1 month HepC infection and DAA therapy follow up visit. Currently on Harvoni x8 weeks. Reports no tolerability issues with Harvoni.  Denies any side effects/adverse drug reactions. Reports optimal adherence and no missed doses. Will test HCV RNA today to monitor progress with Harvoni.   Due to drug-drug interaction concerns with Harvoni and Viann Shove, he was asked to hold [place] prior to starting Harvoni. Instructed by Dr. Sharee Holster to hold [place] and start Harvoni two days after. Today, he states that he is still holding Xtandi. Instructed  to keep holding.   Labs: HepA Ab and HepB sAb were non-reactive from 09/2023 and 11/2023, respectively. No HepA/B vaccinations on file. Agreed to receive HepA 1/2 and HepB 1/2 vaccines. Also due for PCV20 today. He requested to defer his pneumococcal vaccine on next follow up visit.   Reminded the patient to take Harvoni 1 tablet once daily, with or without food. If taking an antacid, wait at least 4 hours before or after taking Harvoni. If taking a proton pump inhibitor (PPI), take it at the same time as Harvoni or up to 2 hours after, but never before. Avoid taking any H2-antagonists (e.g., famotidine). Continue to monitor for signs of adverse drug reactions such as headache, fatigue, nausea, and diarrhea. Contact [facility] pharmacy clinic if you experience these side effects.   Plan: - HCV RNA - Scheduled EOT follow up in 1 months on 03/02/24 - Administer HepA 1 of 2 and Hep B 1/2 vaccines today.  - Administer HepB 2 of 2 vaccine on next follow up visit on 03/02/24 - HepA 2 of 2 vaccine due in 06/2024 - Offer PCV20 on next follow up visit on 03/02/24 - Call for any questions or concerns  Janene Harvey V. Edwena Blow, PharmD Candidate [employer] 01/26/2024, 11:11 PM

## 2024-01-27 ENCOUNTER — Ambulatory Visit (INDEPENDENT_AMBULATORY_CARE_PROVIDER_SITE_OTHER): Payer: Medicare Other | Admitting: Pharmacist

## 2024-01-27 ENCOUNTER — Other Ambulatory Visit: Payer: Self-pay

## 2024-01-27 DIAGNOSIS — Z23 Encounter for immunization: Secondary | ICD-10-CM | POA: Diagnosis not present

## 2024-01-27 DIAGNOSIS — B182 Chronic viral hepatitis C: Secondary | ICD-10-CM

## 2024-01-28 ENCOUNTER — Other Ambulatory Visit: Payer: Self-pay

## 2024-01-29 LAB — HEPATITIS C RNA QUANTITATIVE
HCV Quantitative Log: <1.18 {Log} — ABNORMAL HIGH
HCV RNA, PCR, QN: <15 [IU]/mL — ABNORMAL HIGH

## 2024-02-24 NOTE — Progress Notes (Signed)
 GENITOURINARY Oncology Follow-up Clinic note Date: 02/24/2024   DIAGNOSIS: Metastatic Prostatic adenocarcinoma STAGE: IV PRIOR TREATMENT: Received 240 mg of Firmagon as initial dose for androgen deprivation therapy in June 2022 followed by one shot more in July 2022.  CURRENT TREATMENT: ADT + Xtandi   ONCOLOGIC HISTORY: 58 y.o. male with PMHx of hypertension who was initially evaluated by urology at [facility] following an elevated PSA at 44.3.  Patient subsequently underwent transrectal ultrasound prostate biopsy on 04/27/2021 with all 12 biopsy showing Gleason 4+5 is equal to 9.  He subsequently underwent staging studies with CT abdomen/pelvis and bone scan on 04/29/2021 showing bulky retroperitoneal adenopathy and also 6 cm liver lesion in the right lobe of the liver.  No obvious bony metastases noted.  Unfortunately he had to go to the emergency room on 06/10/2021 with CT scan showing hydronephrosis of the right kidney secondary to secondary to obstruction from  Retroperitoneal lymphadenopathy.  Patient then was noted to have creatinine of 1.6 with GFR of 50 with subsequent placement of a right-sided ureteral stent placement. He  received 240 mg of Firmagon as initial dose for androgen deprivation therapy in June 2022 followed by one shot more in July 2022. No more ADT injections received as stated by the patient. He restarted ADT in 08/2021 with enzalutamide . JJ stent removed 12/26/21.   TODAY'S VISIT:  [name] comes to clinic today for follow-up of prostate cancer. He is unaccompanied for this visit. He reports good energy level and is able to complete all ADLs independently. He has been holding off on enzalutamide  since December due to Hepatitis C treatments, and he took the last dose of Hepatitis C medication on Sunday. He denies any nausea or vomiting and reports good appetite. He denies any changes in bowel habits or urination. He denies fevers, chills, chest pain, cough, shortness of  breath, or bone pain. He denies any other complaints today.     PAST MEDICAL HISTORY: Past Medical History:  Diagnosis Date  . Elevated PSA   . Hypertension      PAST SURGICAL HISTORY: Past Surgical History:  Procedure Laterality Date  . CHOLECYSTECTOMY     Procedure: CHOLECYSTECTOMY  . PROSTATE SURGERY     Procedure: PROSTATE SURGERY     ALLERGIES No Known Allergies   CURRENT MEDICATIONS Current Outpatient Medications  Medication Instructions  . acetaminophen  (TYLENOL ) 500 mg  . diclofenac  sodium (VOLTAREN ) 1 % gel   . enzalutamide  (XTANDI ) 40 mg capsule Take 160 mg by mouth daily.  . escitalopram  (LEXAPRO ) 10 mg, Daily  . ibuprofen  (MOTRIN ) 200 mg  . losartan -hydroCHLOROthiazide  (HYZAAR ) 50-12.5 mg per tablet 1 tablet, oral, Daily  . pantoprazole  (PROTONIX ) 20 mg, Daily  . tadalafiL  (CIALIS ) 20 mg    FAMILY HISTORY No family history on file.   SOCIAL HISTORY Social History   Socioeconomic History  . Marital status: Married    Spouse name: None  . Number of children: None  . Years of education: None  . Highest education level: None  Occupational History  . None  Tobacco Use  . Smoking status: Former    Current packs/day: 1.00    Types: Cigarettes  . Smokeless tobacco: Never  Substance and Sexual Activity  . Alcohol use: Never  . Drug use: Never  . Sexual activity: None  Other Topics Concern  . None  Social History Narrative  . None   Social Drivers of Health   Food Insecurity: No Food Insecurity (11/05/2023)   Received from [facility]   Food vital sign   . Within the past 12 months, you worried that your food would run out before you got money to buy more: Never true   . Within the past 12 months, the food you bought just didn't last and you didn't have money to get more: Never true  Transportation Needs: No Transportation Needs (11/05/2023)   Received from [facility] - Transportation   . Lack of Transportation (Medical): No   .  Lack of Transportation (Non-Medical): No  Safety: Not At Risk (11/05/2023)   Received from [facility]   Safety   . Within the last year, have you been afraid of your partner or ex-partner?: No   . Within the last year, have you been humiliated or emotionally abused in other ways by your partner or ex-partner?: No   . Within the last year, have you been kicked, hit, slapped, or otherwise physically hurt by your partner or ex-partner?: No   . Within the last year, have you been raped or forced to have any kind of sexual activity by your partner or ex-partner?: No  Living Situation: Not on file     Review of systems: 12 point ROS obtained and negative if not mentioned above.    PHYSICAL EXAMINATION: VITAL SIGNS:  Vitals:   02/24/24 1410  BP: 146/81  BP Location: Left arm  Patient Position: Sitting  Pulse: 88  Resp: 16  Temp: 96.9 F (36.1 C)  TempSrc: Temporal  SpO2: 96%  Weight: (!) 138 kg (303 lb 4.8 oz)  Height: 1.727 m (5' 8)    ECOG/[place] Performance Status: 1 - Strenous physical activity restricted; fully ambulatory and able to carry out light work KPS (80-70%)   GENERAL: Obese man in no apparent distress. AAOx3. Seated comfortably in exam chair. Ambulates independently.  SKIN: No lesions, or rash. HEENT: Anicteric sclera. NECK: No pain on palpation or mobilization of the neck.  CHEST: No tenderness on chest wall or spine.  RESPIRATORY: Clear to auscultation bilaterally without rales, wheeze, or rhonchi. Normal work of breathing. CARDIOVASCULAR: RRR, no m/r/g. No peripheral edema noted.  ABDOMEN: Symmetric. Nontender to palpation.  EXTREMITIES: No cyanosis, clubbing or edema. Normal ROM. NEUROLOGIC: Nonfocal. Alert and oriented x 3. Normal sensation and strength.  MUSCULOSKELETAL: Normal ROM noted. Strength and tone are maintained and symmetrical.   LABORATORY STUDIES: Recent Results (from the past 72 hours)  PSA, Total   Collection Time: 02/24/24  1:44 PM  Result  Value Ref Range   PSA, Total <0.01 0.00 - 3.50 ng/mL  Comprehensive Metabolic Panel   Collection Time: 02/24/24  1:44 PM  Result Value Ref Range   Sodium 137 136 - 145 mmol/L   Potassium 3.6 3.4 - 4.5 mmol/L   Chloride 99 98 - 107 mmol/L   CO2 31 21 - 31 mmol/L   Anion Gap 7 6 - 14 mmol/L   Glucose, Random 126 (H) 70 - 99 mg/dL   Blood Urea Nitrogen (BUN) 12 7 - 25 mg/dL   Creatinine 9.21 9.29 - 1.30 mg/dL   eGFR >09 >40 fO/fpw/8.26f7   Albumin 4.0 3.5 - 5.7 g/dL   Total Protein 7.5 6.4 - 8.9 g/dL   Bilirubin, Total 0.3 0.3 - 1.0 mg/dL   Alkaline Phosphatase (ALP) 101 34 - 104 U/L   Aspartate Aminotransferase (AST) 13 13 - 39 U/L   Alanine Aminotransferase (ALT) 20 7 - 52 U/L   Calcium 9.2 8.6 - 10.3 mg/dL  BUN/Creatinine Ratio    CBC without Differential   Collection Time: 02/24/24  1:44 PM  Result Value Ref Range   WBC 9.80 4.40 - 11.00 10*3/uL   RBC 4.93 4.50 - 5.90 10*6/uL   Hemoglobin 14.0 14.0 - 17.5 g/dL   Hematocrit 58.5 (L) 58.4 - 50.4 %   Mean Corpuscular Volume (MCV) 83.9 80.0 - 96.0 fL   Mean Corpuscular Hemoglobin (MCH) 28.4 27.5 - 33.2 pg   Mean Corpuscular Hemoglobin Conc (MCHC) 33.8 33.0 - 37.0 g/dL   Red Cell Distribution Width (RDW) 13.7 12.3 - 17.0 %   Platelet Count (PLT) 325 150 - 450 10*3/uL   Mean Platelet Volume (MPV) 7.9 6.8 - 10.2 fL    Tumor marker pattern over time has been the following:  Lab Results  Component Value Date   PSA <0.01 02/24/2024   PSA <0.01 12/09/2023   PSA <0.01 09/09/2023   PSA <0.01 06/24/2023   PSA <0.01 03/25/2023   PSA <0.01 12/24/2022   PSA <0.01 09/24/2022   PSA <0.01 06/25/2022   PSA <0.01 03/26/2022   PSA 0.03 12/25/2021   PSA 0.28 10/23/2021   PSA 8.08 (H) 09/25/2021   PSAFREE 6.7 09/03/2021    IMAGING STUDIES: No new imaging to review.     PATHOLOGY: Final Pathologic Diagnosis  A.  PROSTATE, LEFT BASE LATERAL, CORE BIOPSY (Outside case number: [id number]):              Adenocarcinoma, acinar  type.              Gleason score: 4 + 5 = 9; Grade group 5              70% of the tissue is involved by tumor.              Number of cores examined: 1              Number of cores with tumor: 1              Lymphovascular invasion by tumor is not identified.              Perineural invasion by tumor is present.   B.  PROSTATE, LEFT MID LATERAL, CORE BIOPSY (Outside case number: [id number]):              Adenocarcinoma, acinar type.              Gleason score: 4 + 5 = 9; Grade group 5              70% of the tissue is involved by tumor.              Number of cores examined: 1              Number of cores with tumor: 1              Lymphovascular invasion by tumor is not identified.              Perineural invasion by tumor is present.              Focal extraprostatic extension (EPE) is present.    C.  PROSTATE, LEFT APEX LATERAL, CORE BIOPSY (Outside case number: [id number]):              Adenocarcinoma, acinar type.              Gleason score: 4 + 5 = 9; Grade group 5  70% of the tissue is involved by tumor.              Number of cores examined: 1              Number of cores with tumor: 1              Lymphovascular invasion by tumor is not identified.              Perineural invasion by tumor is present.              Focal extraprostatic extension (EPE) is present.    D.  PROSTATE, LEFT BASE, CORE BIOPSY (Outside case number: [id number]):              Adenocarcinoma, acinar type.              Gleason score: 4 + 5 = 9; Grade group 5              70% of the tissue is involved by tumor.              Number of cores examined: 1              Number of cores with tumor: 1              Lymphovascular invasion by tumor is not identified.              Perineural invasion by tumor is not identified.    E.  PROSTATE, LEFT MID, CORE BIOPSY (Outside case number: [id number]):              Adenocarcinoma, acinar type.              Gleason score: 4 + 5 = 9; Grade group  5              70% of the tissue is involved by tumor.              Number of cores examined: 1              Number of cores with tumor: 1              Lymphovascular invasion by tumor is not identified.              Perineural invasion by tumor is not identified.    F.  PROSTATE, LEFT APEX, CORE BIOPSY (Outside case number: [id number]):              Adenocarcinoma, acinar type.              Gleason score: 4 + 5 = 9; Grade group 5              90% of the tissue is involved by tumor.              Number of cores examined: 1              Number of cores with tumor: 1              Lymphovascular invasion by tumor is not identified.              Perineural invasion by tumor is not identified.    G.  PROSTATE, RIGHT BASE, CORE BIOPSY (Outside case number: [id number]):              Adenocarcinoma, acinar type.  Gleason score: 4 + 4 = 8; Grade group 4              60% of the tissue is involved by tumor.              Number of cores examined: 1              Number of cores with tumor: 1              Lymphovascular invasion by tumor is not identified.              Perineural invasion by tumor is not identified.   H.  PROSTATE, RIGHT MID, CORE BIOPSY (Outside case number: [id number]):              Adenocarcinoma, acinar type.              Gleason score: 4 + 5 = 9; Grade group 5              70% of the tissue is involved by tumor.              Number of cores examined: 1              Number of cores with tumor: 1              Lymphovascular invasion by tumor is not identified.              Perineural invasion by tumor is not identified.    I.  PROSTATE, RIGHT APEX, CORE BIOPSY (Outside case number: [id number]):              Adenocarcinoma, acinar type.              Gleason score: 4 + 5 = 9; Grade group 5              50% of the tissue is involved by tumor.              Number of cores examined: 1              Number of cores with tumor: 1              Lymphovascular  invasion by tumor is not identified.              Perineural invasion by tumor is present.    J.  PROSTATE, RIGHT BASE LATERAL, CORE BIOPSY (Outside case number: [id number]):              Adenocarcinoma, acinar type.              Gleason score: 4 + 4 = 8; Grade group 4              5% of the tissue is involved by tumor.              Number of cores examined: 1              Number of cores with tumor: 1              Lymphovascular invasion by tumor is not identified.              Perineural invasion by tumor is not identified.   K.  PROSTATE, RIGHT MID LATERAL, CORE BIOPSY (Outside case number: [id number]):              Adenocarcinoma, acinar type.  Gleason score: 4 + 5 = 9; Grade group 5              50% of the tissue is involved by tumor.              Number of cores examined: 1              Number of cores with tumor: 1              Lymphovascular invasion by tumor is not identified.              Perineural invasion by tumor is not identified.    L.  PROSTATE, RIGHT APEX LATERAL, CORE BIOPSY (Outside case number: [id number]):              Adenocarcinoma, acinar type.              Gleason score: 4 + 5 = 9; Grade group 5              10% of the tissue is involved by tumor.              Number of cores examined: 1              Number of cores with tumor: 1              Lymphovascular invasion by tumor is not identified.              Perineural invasion by tumor is present.        ASSESSMENT/PLAN: [name] is a 58 y.o. male with metastatic prostate adenocarcinoma to retroperitoneal lymph nodes and liver, without osseous metastatasis (PSA 44.3 at diagnosis, Gleason score 4+5=9/Grade Group 5, diagnosed 03/2021) complicated by obstructive right sided renal hydronephrosis s/p right uretral stent placed 05/2021 [facility]. Prior to establishing care with [facility], he received 2 doses of degarelix.  We previously discussed the natural history of  metastatic prostate  adenocarcinoma with Abron Neddo focusing on his case in particular. We reviewed the pathology, imaging, and lab findings at length. He has overall low volume disease and would recommend, hormonal therapy along with an androgen inhibitor. Switched to lupron and added enzalutamide  in Fall 2022.   - Patient continues to tolerate ADT well - ADT due today, 02/24/24 - Labs reviewed, PSA undetectable - Enzalutamide  was held from 11/2024 until today. He completed Hepatitis C treatment on 02/21/24. He will restart enzalutamide  tomorrow - Reviewed side effects and symptoms of concern at length - CT scan performed 03/18/2022 showing response to therapy with decrease in lymphadenopathy - RTC in 3 months for labs and visit  Erectile dysfunction:  Following with urology - Previously completed cialis  trial  Mood changes/mild depression:  Symptoms have improved overall - Continue with monitoring.   Chronic back pain:  Stable. Discussed with patient that since he has not had previous osseous metastases, unlikely 2/2 to prostate cancer, especially since PSA remains low. He voiced understanding. Encouraged him to follow up with PCP   By the end of the visit, I believe I answered all the patient's questions and concerns. Contact information for the cancer center was given and he knows to call if any concerns arise.    This is a patient of Dr. Darrelyn.  Electronically signed by:  April Macario Kitty, PA-C, 02/24/2024 6:11 PM

## 2024-03-02 ENCOUNTER — Ambulatory Visit: Payer: Medicare Other | Admitting: Pharmacist

## 2024-03-02 NOTE — Progress Notes (Signed)
 HPI: [name] is a 58 y.o. male who presents to the [facility] pharmacy clinic for Hepatitis C follow-up.  Medication: Harvoni x 8 weeks  Start Date: 12/21/2023  Hepatitis C Genotype: 1a  Fibrosis Score: F0 (incorrect) - median kPa 40.8 with evidence of portal hypertension  Hepatitis C RNA: 16.9 million (9/24) > UD (01/27/24)  Patient Active Problem List   Diagnosis Date Noted   Hx of adenomatous colonic polyps 10/14/2023   Hepatitis C virus infection without hepatic coma 10/13/2023   Dyspnea on exertion 09/08/2023   Depression, major, single episode, severe (HCC) 11/18/2022   Fall 11/18/2022   Seborrheic keratosis 04/10/2022   Chronic low back pain without sciatica 04/10/2022   A gamma beta+ HPFH and beta 0 thalassemia in [facility]) 01/29/2022   Prostate cancer (HCC) 01/29/2022   Encounter to establish care 08/02/2021   Erectile dysfunction 02/27/2020   Back pain without sciatica 02/27/2020   Class 3 severe obesity due to excess calories with serious comorbidity and body mass index (BMI) of 40.0 to 44.9 in adult (HCC) 02/27/2020   Status post total knee replacement, right 10/15/2018   Arthritis of right knee 07/16/2018   Prediabetes 01/02/2017   Essential hypertension 09/04/2016    Patient's Medications  New Prescriptions   No medications on file  Previous Medications   ACETAMINOPHEN (TYLENOL) 500 MG TABLET    Take 500 mg by mouth every 6 (six) hours as needed for moderate pain or headache.   ASPIRIN EC 325 MG EC TABLET    Take 1 tablet (325 mg total) by mouth daily with breakfast.   CLOTRIMAZOLE (LOTRIMIN AF) 1 % CREAM    Apply 1 Application topically 2 (two) times daily. Apply until resolution.   DICLOFENAC SODIUM (VOLTAREN) 1 % GEL    Apply 2 g topically 4 (four) times daily.   HARVONI 90-400 MG TABS    Take 1 tablet by mouth daily.   IBUPROFEN (ADVIL) 200 MG TABLET    Take 200 mg by mouth every 6 (six) hours as needed for headache or moderate pain.    LOSARTAN-HYDROCHLOROTHIAZIDE (HYZAAR) 100-12.5 MG TABLET    Take 1 tablet by mouth daily.   PANTOPRAZOLE (PROTONIX) 20 MG TABLET    TAKE 1 TABLET (20 MG TOTAL) BY MOUTH DAILY.   TADALAFIL (CIALIS) 20 MG TABLET    Take 1 tablet (20 mg total) by mouth daily as needed for up to 30 days for Erectile Dysfunction.   TAMSULOSIN (FLOMAX) 0.4 MG CAPS CAPSULE    Take 1 capsule (0.4 mg total) by mouth daily.   XTANDI 40 MG CAPSULE    Take 160 mg by mouth daily.  Modified Medications   No medications on file  Discontinued Medications   No medications on file    Allergies: No Known Allergies  Past Medical History: Past Medical History:  Diagnosis Date   Arthritis    Chronic bilateral low back pain with right-sided sciatica    ED (erectile dysfunction)    GERD (gastroesophageal reflux disease)    Hx of adenomatous colonic polyps 10/14/2023   2019 4 polyps max 11 mm - all adenomas 2007 7 mm polyp removed, not recovered, proctitis  10/14/2023 7 polyps max 10 mm     Hydronephrosis of right kidney    Hypertension    followed by pcp   (nuclear test study 02-28-2010 in epic, normal no ischemia, nuclear ef 51%)   Pre-diabetes    Prostate cancer (HCC)    urologist--dr  newsome   -- dx 04/ 2022,   started ADT 06-14-2021   Wears glasses     Social History: Social History   Socioeconomic History   Marital status: Married    Spouse name: Not on file   Number of children: Not on file   Years of education: Not on file   Highest education level: Not on file  Occupational History   Not on file  Tobacco Use   Smoking status: Former    Current packs/day: 0.00    Types: Cigarettes    Start date: 11/19/2005    Quit date: 11/20/2015    Years since quitting: 8.2   Smokeless tobacco: Never  Vaping Use   Vaping status: Never Used  Substance and Sexual Activity   Alcohol use: No   Drug use: No   Sexual activity: Yes    Birth control/protection: None  Other Topics Concern   Not on file   Social History Narrative   Not on file   Social Drivers of Health   Financial Resource Strain: Low Risk  (11/05/2023)   Overall Financial Resource Strain (CARDIA)    Difficulty of Paying Living Expenses: Not hard at all  Food Insecurity: No Food Insecurity (11/05/2023)   Hunger Vital Sign    Worried About Running Out of Food in the Last Year: Never true    Ran Out of Food in the Last Year: Never true  Transportation Needs: No Transportation Needs (11/05/2023)   PRAPARE - Administrator, Civil Service (Medical): No    Lack of Transportation (Non-Medical): No  Physical Activity: Sufficiently Active (11/05/2023)   Exercise Vital Sign    Days of Exercise per Week: 7 days    Minutes of Exercise per Session: 30 min  Stress: No Stress Concern Present (11/05/2023)   [employer] of Occupational Health - Occupational Stress Questionnaire    Feeling of Stress : Not at all  Social Connections: Moderately Integrated (11/05/2023)   Social Connection and Isolation Panel [NHANES]    Frequency of Communication with Friends and Family: More than three times a week    Frequency of Social Gatherings with Friends and Family: More than three times a week    Attends Religious Services: More than 4 times per year    Active Member of Clubs or Organizations: No    Attends Banker Meetings: Never    Marital Status: Married    Labs: Hepatitis C Lab Results  Component Value Date   HCVGENOTYPE 1a 12/07/2023   HCVRNAPCRQN <15 (H) 01/27/2024   FIBROSTAGE F0 12/07/2023   Hepatitis B Lab Results  Component Value Date   HEPBSAG NON-REACTIVE 12/07/2023   HEPBCAB Negative 10/13/2023   Hepatitis A Lab Results  Component Value Date   HAV Negative 10/13/2023   HIV Lab Results  Component Value Date   HIV Non Reactive 10/13/2023   HIV NON REACTIVE 02/27/2010   HIV NON REAC 04/21/2008   Lab Results  Component Value Date   CREATININE 0.82 10/20/2023   CREATININE 1.21  09/08/2023   CREATININE 1.07 11/17/2022   CREATININE 0.75 (L) 02/03/2022   CREATININE 1.10 06/18/2021   Lab Results  Component Value Date   AST 20 09/08/2023   AST 24 11/17/2022   AST 19 02/03/2022   ALT 32 12/07/2023   ALT 30 09/08/2023   ALT 41 11/17/2022   INR 0.9 12/07/2023   INR 0.9 10/13/2023   INR 0.96 07/06/2018    Assessment: Byron presents to  clinic today for HCV follow-up as they have completed their full 8 weeks of Harvoni without any missed doses or side effects. Will check HCV RNA today and follow-up in 3 months with [facility] for SVR.  Understands importance of following cancer center's instructions about resuming Xtandi now that he has completed Harvoni.   Will receive 2/2 HBV vaccination along with PCV20 today. Due for 2/2 HAV vaccine in July. Will recheck HBV immunity when he follows up with [facility].    Plan: - Check HCV RNA - Resume Xtandi per cancer center recommendations - Administer 2/2 HBV  - Follow up with [facility] Cornfield on ***  Margarite Gouge, PharmD, CPP, BCIDP, AAHIVP Clinical Pharmacist Practitioner Infectious Diseases Clinical Pharmacist [facility]

## 2024-03-03 ENCOUNTER — Ambulatory Visit: Payer: Medicare Other | Admitting: Pharmacist

## 2024-03-03 ENCOUNTER — Other Ambulatory Visit: Payer: Self-pay

## 2024-03-03 DIAGNOSIS — B182 Chronic viral hepatitis C: Secondary | ICD-10-CM

## 2024-03-03 DIAGNOSIS — Z23 Encounter for immunization: Secondary | ICD-10-CM

## 2024-03-05 LAB — HEPATITIS C RNA QUANTITATIVE
HCV Quantitative Log: <1.18 {Log_IU}/mL
HCV RNA, PCR, QN: <15 [IU]/mL

## 2024-03-09 ENCOUNTER — Other Ambulatory Visit: Payer: Self-pay

## 2024-03-09 ENCOUNTER — Other Ambulatory Visit: Payer: Self-pay | Admitting: Family Medicine

## 2024-03-09 MED ORDER — LOSARTAN POTASSIUM-HCTZ 100-12.5 MG PO TABS
1.0000 | ORAL_TABLET | Freq: Every day | ORAL | 3 refills | Status: AC
  Filled 2024-03-09: qty 90, 90d supply, fill #0
  Filled 2024-06-24: qty 90, 90d supply, fill #1
  Filled 2024-10-11: qty 90, 90d supply, fill #2
  Filled 2025-01-24: qty 90, 90d supply, fill #3

## 2024-03-11 ENCOUNTER — Other Ambulatory Visit: Payer: Self-pay

## 2024-06-21 ENCOUNTER — Ambulatory Visit: Admitting: Infectious Diseases

## 2024-06-28 ENCOUNTER — Other Ambulatory Visit: Payer: Self-pay

## 2024-08-17 ENCOUNTER — Encounter: Payer: Self-pay | Admitting: Family Medicine

## 2024-08-17 ENCOUNTER — Ambulatory Visit (INDEPENDENT_AMBULATORY_CARE_PROVIDER_SITE_OTHER): Admitting: Family Medicine

## 2024-08-17 VITALS — BP 128/78 | Ht 70.0 in | Wt 305.4 lb

## 2024-08-17 DIAGNOSIS — Z0000 Encounter for general adult medical examination without abnormal findings: Secondary | ICD-10-CM

## 2024-08-17 DIAGNOSIS — M549 Dorsalgia, unspecified: Secondary | ICD-10-CM

## 2024-08-17 DIAGNOSIS — E781 Pure hyperglyceridemia: Secondary | ICD-10-CM

## 2024-08-17 DIAGNOSIS — Z8639 Personal history of other endocrine, nutritional and metabolic disease: Secondary | ICD-10-CM | POA: Diagnosis not present

## 2024-08-17 DIAGNOSIS — Z87891 Personal history of nicotine dependence: Secondary | ICD-10-CM

## 2024-08-17 LAB — POCT GLYCOSYLATED HEMOGLOBIN (HGB A1C): Hemoglobin A1C: 5.9 % — AB (ref 4.0–5.6)

## 2024-08-17 NOTE — Progress Notes (Signed)
    SUBJECTIVE:   Chief compliant/HPI: annual examination  [name] is a 58 y.o. who presents today for an annual exam.   History tabs reviewed and updated.   Back pain, feeling off balance sometimes Takes OTC tylenol  which helps the back pain.  Also feels like he sometimes has some difficulty with balance, though this is not often.  Had MRI in 2022 with spinal stenosis. No numbness, weakness in the legs. No bowel or bladder incontinence. Voltaren  did not help much.  Leg lesions Maybe improved a little with the lotrimin , but he has more lesions now.  OBJECTIVE:   BP 128/78   Ht 5' 10 (1.778 m)   Wt (!) 305 lb 6.4 oz (138.5 kg)   BMI 43.82 kg/m   General: Alert and oriented, in NAD Skin: Warm, dry, and intact; multiple hyperpigmented and slightly scaly plaques along the bilateral lower extremities as pictured below HEENT: NCAT, EOM grossly normal, midline nasal septum Cardiac: RRR, no m/r/g appreciated Respiratory/Back: CTAB, breathing and speaking comfortably on RA; no midline or paraspinal muscle tenderness Abdominal: Soft, nontender, nondistended, normoactive bowel sounds Extremities: Moves all extremities grossly equally Neurological: No gross focal deficit, strength and sensation intact in bilateral lower extremities, gait normal, straight leg raise testing negative Psychiatric: Appropriate mood and affect        ASSESSMENT/PLAN:   Back pain, intermittent balance problems Continues despite conservative therapy.  Reassuring without neurologic symptoms today on history or exam.  Will send to physical therapy at this time.  For his balance problems, I am reassured by his gait and overall exam today; however, hopeful that physical therapy can also help to increase quadriceps muscles which may help stability overall.  Long-term, would recommend weight loss.  Annual Examination  See AVS for age appropriate recommendations.  PHQ score 8, reviewed. Blood pressure  value is at goal, discussed.   Considered the following screening exams based upon USPSTF recommendations: Diabetes screening: ordered; he remains in the prediabetes range of 5.9. Screening for elevated cholesterol: ordered HIV testing: Not indicated Hepatitis C: recently treated, RNA load undetectable Hepatitis B: previously negative Syphilis if at high risk: not high risk Colorectal cancer screening: up to date on screening for CRC. Next due in 09/2026 due to 10 mm tubulovillous adenoma and +2 hyperplastic polyps Lung cancer screening: ordered for November 2025 given 20 pack year history and quit 8 years ago PSA discussed: he follows routinely for oncologist given history of prostate cancer Since he has some difficulty with taking his medicines all the time, recommended he take some other medicines with his blood pressure medicine since he has good adherence to this.  He will come back in 2 to 4 weeks to further discuss weight loss and to obtain biopsy of lower leg lesions given no improvement with steroids or antifungals.  MyChart Activation: Already signed up  Stuart Redo, MD [facility]

## 2024-08-17 NOTE — Patient Instructions (Signed)
 Keep up the great work! We will collect cholesterol panel today.  I have sent in referral for back pain.  Come back in 2-4 weeks to discuss weight loss and biopsy of the skin lesion. DO NOT use any more creams on this until we can figure this out.  I have ordered you a lung cancer screening CT for November 2025.

## 2024-08-18 ENCOUNTER — Ambulatory Visit: Payer: Self-pay | Admitting: Family Medicine

## 2024-08-18 LAB — LIPID PANEL
Chol/HDL Ratio: 2.8 ratio (ref 0.0–5.0)
Cholesterol, Total: 173 mg/dL (ref 100–199)
HDL: 62 mg/dL (ref >39)
LDL Chol Calc (NIH): 92 mg/dL (ref 0–99)
Triglycerides: 106 mg/dL (ref 0–149)
VLDL Cholesterol Cal: 19 mg/dL (ref 5–40)

## 2024-09-01 DIAGNOSIS — M47816 Spondylosis without myelopathy or radiculopathy, lumbar region: Secondary | ICD-10-CM | POA: Diagnosis not present

## 2024-09-01 DIAGNOSIS — I10 Essential (primary) hypertension: Secondary | ICD-10-CM | POA: Diagnosis not present

## 2024-09-05 ENCOUNTER — Other Ambulatory Visit (HOSPITAL_COMMUNITY): Payer: Self-pay | Admitting: General Surgery

## 2024-09-05 DIAGNOSIS — I10 Essential (primary) hypertension: Secondary | ICD-10-CM

## 2024-09-06 DIAGNOSIS — M47816 Spondylosis without myelopathy or radiculopathy, lumbar region: Secondary | ICD-10-CM | POA: Diagnosis not present

## 2024-09-06 DIAGNOSIS — I10 Essential (primary) hypertension: Secondary | ICD-10-CM | POA: Diagnosis not present

## 2024-09-13 ENCOUNTER — Encounter: Attending: General Surgery | Admitting: Dietician

## 2024-09-13 ENCOUNTER — Encounter: Payer: Self-pay | Admitting: Dietician

## 2024-09-13 VITALS — Ht 70.0 in | Wt 307.7 lb

## 2024-09-13 DIAGNOSIS — M47816 Spondylosis without myelopathy or radiculopathy, lumbar region: Secondary | ICD-10-CM | POA: Insufficient documentation

## 2024-09-13 DIAGNOSIS — Z713 Dietary counseling and surveillance: Secondary | ICD-10-CM | POA: Diagnosis not present

## 2024-09-13 DIAGNOSIS — I10 Essential (primary) hypertension: Secondary | ICD-10-CM | POA: Diagnosis not present

## 2024-09-13 DIAGNOSIS — B182 Chronic viral hepatitis C: Secondary | ICD-10-CM | POA: Insufficient documentation

## 2024-09-13 DIAGNOSIS — Z6841 Body Mass Index (BMI) 40.0 and over, adult: Secondary | ICD-10-CM | POA: Insufficient documentation

## 2024-09-13 DIAGNOSIS — Z01818 Encounter for other preprocedural examination: Secondary | ICD-10-CM | POA: Diagnosis not present

## 2024-09-13 DIAGNOSIS — E669 Obesity, unspecified: Secondary | ICD-10-CM | POA: Insufficient documentation

## 2024-09-13 NOTE — Progress Notes (Signed)
 Nutrition Assessment for Bariatric Surgery: Pre-Surgery Behavioral and Nutrition Intervention Program   Medical Nutrition Therapy  Appt Start Time: 1354    End Time: 1501  Patient was seen on 09/13/2024 for Pre-Operative Nutrition Assessment. Purpose of todays visit  enhance perioperative outcomes along with a healthy weight maintenance   Referral stated Supervised Weight Loss (SWL) visits needed: 6 Months  Planned surgery: RNY Gastric By-Pass Pt expectation of surgery: to lose at least 80 lbs  NUTRITION ASSESSMENT   Anthropometrics  Start weight at [facility]: 307.7 lbs (date: 09/13/2024)  Height: 70 in BMI: 44.15 kg/m2     Clinical  Medical hx: cancer, obesity, HTN Medications: losartan , xtandi   Labs: sodium 133, glucose 119, hemoglobin 13.3, hematocrit 39.0, anion gap 5 Notable signs/symptoms: non noted Any previous deficiencies? No  Evaluation of Nutritional Deficiencies: Micronutrient Nutrition Focused Physical Exam: Hair: No issues observed Eyes: No issues observed Mouth: No issues observed Neck: No issues observed Nails: No issues observed Skin: No issues observed  Lifestyle & Dietary Hx Pt arrived with his wife. Pt states he drinks a lot of water , stating he is not a soda drinker.  Current Physical Activity Recommendations state 150 minutes per week of moderate to vigorous movement including Cardio and 1-2 days of resistance activities as well as flexibility/balance activities:  Pts current physical activity: ADLs, with 0% recommendation reached   Sleep Hygiene: duration and quality: sleep okay, get up some during the night to use the restroom  Current Patient Perceived Stress Level as stated by pt on a scale of 1-10:  2       Stress Management Techniques: pray  According to the Dietary Guidelines for Americans Recommendation: equivalent 1.5-2 cups fruits per day, equivalent 2-3 cups vegetables per day and at least half all grains whole  Fruit servings per day (on  average): 0-1, meeting 0-50% recommendation  Non-starchy vegetable servings per day (on average): 1-2, meeting 33-50% recommendation  Whole Grains per day (on average): 0-1  Number of meals missed/skipped per week out of 21: 7  24-Hr Dietary Recall First Meal: skip Snack:  Second Meal: meat, rice, vegetable or sandwich Snack: potato chips, honey buns, debbie cakes, ice cream Third Meal: meat, mashed potatoes, vegetable, or lasagna Snack: potato chips, honey buns, debbie cakes, ice cream Beverages: water , soda occasionally  Alcoholic beverages per week: 0   Estimated Energy Needs Calories: 1700  NUTRITION DIAGNOSIS  Overweight/obesity ([place]-3.3) related to past poor dietary habits and physical inactivity as evidenced by patient w/ planned RNY by-pass surgery following dietary guidelines for continued weight loss.  NUTRITION INTERVENTION  Nutrition counseling (C-1) and education (E-2) to facilitate bariatric surgery goals.  Educated pt on micronutrient deficiencies post-surgery and behavioral/dietary strategies to start in order to mitigate that risk   Behavioral and Dietary Interventions Pre-Op Goals Reviewed with the Patient Nutrition: Healthy Eating Behaviors Switch to non-caloric, non-carbonated and non-caffeinated beverages such as  water , unsweetened tea, Crystal Light and zero calorie beverages (aim for 64 oz. per day) Cut out grazing between meals or at night  Find a protein shake you like Eat every 3-5 hours        Eliminate distractions while eating (TV, computer, reading, driving, texting) Take 79-69 minutes to eat a meal  Decrease high sugar foods/decrease high fat/fried foods Eliminate alcoholic beverages Increase protein intake (eggs, fish, chicken, yogurt) before surgery Eat non starchy vegetables 2 times a day 7 days a week Eat complex carbohydrates such as whole grains and fruits   Behavioral  Modification: Physical Activity Increase my usual daily activity (use  stairs, park farther, etc.) Engage in _______________________  activity  _______ minutes ______ times per week  Other:    _________________________________________________________________     Problem Solving I will think about my usual eating patterns and how to tweak them How can my friends and family support me Barriers to starting my changes Learn and understand appetite verses hunger   Healthy Coping Allow for ___________ activities per week to help me manage stress Reframe negative thoughts I will keep a picture of someone or something that is my inspiration & look at it daily   Monitoring  Weigh myself once a week  Measure my progress by monitoring how my clothes fit Keep a food record of what I eat and drink for the next ________ (time period) Take pictures of what I eat and drink for the next ________ (time period) Use an app to count steps/day for the next_______ (time period) Measure my progress such as increased energy and more restful sleep Monitor your acid reflux and bowel habits, are they getting better?   *Goals that are bolded indicate the pt would like to start working towards these  Handouts Provided Include  Bariatric Surgery handouts (Nutrition Visits, Pre Surgery Behavioral Change Goals, Protein Shakes Brands to Choose From, Vitamins & Mineral Supplementation)  Learning Style & Readiness for Change Teaching method utilized: Visual, Auditory, and hands on  Demonstrated degree of understanding via: Teach Back  Readiness Level: Preparation Barriers to learning/adherence to lifestyle change: current lack of physical activity  RD's Notes for Next Visit Patient progress toward chosen goals.   MONITORING & EVALUATION Dietary intake, weekly physical activity, body weight, and preoperative behavioral change goals   Next Steps  Patient is to follow up at [facility] in 2 weeks for first SWL visit.

## 2024-09-19 ENCOUNTER — Ambulatory Visit: Admitting: Family Medicine

## 2024-09-19 NOTE — Progress Notes (Incomplete)
    SUBJECTIVE:   CHIEF COMPLAINT / HPI:   Weight loss Contributing to his back pain.  He was sent to physical therapy for this***.  He has seen bariatric surgery for this concern and was referred to a dietitian and psychologist.  He has been scheduled at [facility] for Roux-en-Y gastric bypass.  Leg lesions Mildly improved with Lotrimin .  Would like a biopsy today.  No history of peripheral arterial disease.***  PERTINENT  PMH / PSH: ***  OBJECTIVE:   There were no vitals taken for this visit.  ***  ASSESSMENT/PLAN:   Assessment & Plan      Marvin Redo, MD [facility]

## 2024-09-20 ENCOUNTER — Ambulatory Visit (HOSPITAL_COMMUNITY)
Admission: RE | Admit: 2024-09-20 | Discharge: 2024-09-20 | Disposition: A | Discharge status: Home / Self Care | Source: Ambulatory Visit | Attending: General Surgery | Admitting: General Surgery

## 2024-09-20 ENCOUNTER — Other Ambulatory Visit: Payer: Self-pay

## 2024-09-20 ENCOUNTER — Encounter (HOSPITAL_COMMUNITY)
Admission: RE | Admit: 2024-09-20 | Discharge: 2024-09-20 | Disposition: A | Discharge status: Home / Self Care | Source: Ambulatory Visit | Attending: General Surgery | Admitting: General Surgery

## 2024-09-20 DIAGNOSIS — Z01818 Encounter for other preprocedural examination: Secondary | ICD-10-CM | POA: Diagnosis not present

## 2024-09-20 DIAGNOSIS — K219 Gastro-esophageal reflux disease without esophagitis: Secondary | ICD-10-CM | POA: Diagnosis not present

## 2024-09-20 DIAGNOSIS — I10 Essential (primary) hypertension: Secondary | ICD-10-CM | POA: Diagnosis not present

## 2024-09-26 ENCOUNTER — Ambulatory Visit: Admitting: Dietician

## 2024-09-26 ENCOUNTER — Telehealth: Payer: Self-pay | Admitting: Neurology

## 2024-09-26 NOTE — Telephone Encounter (Signed)
 Reschedule appointment due to wife having surgery.

## 2024-09-27 ENCOUNTER — Institutional Professional Consult (permissible substitution): Admitting: Neurology

## 2024-09-29 ENCOUNTER — Encounter: Attending: General Surgery | Admitting: Dietician

## 2024-09-29 ENCOUNTER — Encounter: Payer: Self-pay | Admitting: Dietician

## 2024-09-29 VITALS — Ht 70.0 in | Wt 306.6 lb

## 2024-09-29 DIAGNOSIS — B182 Chronic viral hepatitis C: Secondary | ICD-10-CM | POA: Diagnosis not present

## 2024-09-29 DIAGNOSIS — M47816 Spondylosis without myelopathy or radiculopathy, lumbar region: Secondary | ICD-10-CM | POA: Diagnosis not present

## 2024-09-29 DIAGNOSIS — E669 Obesity, unspecified: Secondary | ICD-10-CM | POA: Insufficient documentation

## 2024-09-29 DIAGNOSIS — Z713 Dietary counseling and surveillance: Secondary | ICD-10-CM | POA: Insufficient documentation

## 2024-09-29 DIAGNOSIS — I10 Essential (primary) hypertension: Secondary | ICD-10-CM | POA: Insufficient documentation

## 2024-09-29 DIAGNOSIS — Z6841 Body Mass Index (BMI) 40.0 and over, adult: Secondary | ICD-10-CM | POA: Diagnosis not present

## 2024-09-29 NOTE — Progress Notes (Signed)
 Supervised Weight Loss Visit Bariatric Nutrition Education Appt Start Time: 1541    End Time: 1603  Planned surgery: RNY Gastric By-Pass Pt expectation of surgery: to lose at least 80 lbs  Referral stated Supervised Weight Loss (SWL) visits needed: 6 Months  1 out of 2 SWL Appointments   Planned surgery: RNY Gastric By-Pass Pt expectation of surgery: to lose at least 80 lbs  NUTRITION ASSESSMENT   Anthropometrics  Start weight at [facility]: 307.7 lbs (date: 09/13/2024)  Height: 70 in Weight today: 306.6 lb BMI: 43.99 kg/m2     Clinical  Medical hx: cancer, obesity, HTN Medications: losartan , xtandi   Labs: sodium 133, glucose 119, hemoglobin 13.3, hematocrit 39.0, anion gap 5 Notable signs/symptoms: non noted Any previous deficiencies? No  Lifestyle & Dietary Hx  Pt states he has been reading food labels, stating he has learned a lot in a shot time. Pt states he is working on eating breakfast.  Estimated daily fluid intake: 64 oz (water ) Supplements:  Current average weekly physical activity: ADLs  24-Hr Dietary Recall First Meal: skip Snack:  Second Meal: meat, rice, vegetable or sandwich Snack: potato chips, honey buns, debbie cakes, ice cream Third Meal: meat, mashed potatoes, vegetable, or lasagna Snack: potato chips, honey buns, debbie cakes, ice cream Beverages: water   Alcoholic beverages per week: 0   Estimated Energy Needs Calories: 1700  NUTRITION DIAGNOSIS  Overweight/obesity ([place]-3.3) related to past poor dietary habits and physical inactivity as evidenced by patient w/ planned gastric by-pass surgery following dietary guidelines for continued weight loss.  NUTRITION INTERVENTION  Nutrition counseling (C-1) and education (E-2) to facilitate bariatric surgery goals.  Pre-Op Goals Reviewed with the Patient  Meal planning and meal prepping are powerful tools for staying on track with bariatric food patterns, especially as patients adjust to smaller  portions, protein-focused meals, and structured eating schedules. Planning meals in advance helps ensure that each meal includes a high-quality protein source, supports hydration goals, and avoids impulsive or non-compliant food choices. Prepping meals ahead of time--such as cooking lean meats, portioning out snacks, or packing your lunch--makes it easier to stick to the recommended eating pattern, avoid skipping meals, and meet daily nutrition targets like 80 grams of protein and 64 oz of fluids. It also reduces stress around food decisions and supports long-term success.  Pre-Op Goals Progress & New Goals Continue: Eliminate distractions while eating (TV, computer, reading, driving, texting) Continue: Take 20-30 minutes to eat a meal  Continue: aiming for 64 ounces of water  New: eat every 3-5 hours; use meal planning and prepping to execute   Handouts Provided Include  Meal Ideas Bariatric MyPlate  Learning Style & Readiness for Change Teaching method utilized: Visual & Auditory  Demonstrated degree of understanding via: Teach Back  Readiness Level: preparation Barriers to learning/adherence to lifestyle change: reduced physical activity  RD's Notes for next Visit  Patient progress toward chosen goals  MONITORING & EVALUATION Dietary intake, weekly physical activity, body weight, and pre-op goals.  Next Steps  Patient is to return to [facility] in 1 month for next SWL visit.

## 2024-10-05 ENCOUNTER — Encounter: Payer: Self-pay | Admitting: Neurology

## 2024-10-05 ENCOUNTER — Ambulatory Visit: Admitting: Neurology

## 2024-10-05 VITALS — BP 138/88 | HR 88 | Ht 70.0 in | Wt 304.8 lb

## 2024-10-05 DIAGNOSIS — R0681 Apnea, not elsewhere classified: Secondary | ICD-10-CM | POA: Diagnosis not present

## 2024-10-05 DIAGNOSIS — Z9189 Other specified personal risk factors, not elsewhere classified: Secondary | ICD-10-CM | POA: Diagnosis not present

## 2024-10-05 DIAGNOSIS — R0683 Snoring: Secondary | ICD-10-CM | POA: Diagnosis not present

## 2024-10-05 DIAGNOSIS — R351 Nocturia: Secondary | ICD-10-CM

## 2024-10-05 DIAGNOSIS — G4719 Other hypersomnia: Secondary | ICD-10-CM

## 2024-10-05 NOTE — Progress Notes (Signed)
 Subjective:    Patient ID: [name] is a 58 y.o. male.  HPI    True Mar, MD, PhD [facility] [address] [address] [place], [place] [address]  Dear Dr. Stevie,   I saw your patient, [name], upon your kind request in my sleep clinic today for initial consultation of his sleep disorder, in particular, concern for underlying obstructive sleep apnea.  The patient is unaccompanied today.  As you know, [name] is a 58 year old male with an underlying medical history of hypertension, prostate cancer, arthritis, with status post right total knee replacement in 2019, low back pain, reflux disease, ED, prediabetes, history of right kidney hydronephrosis, prior smoking, and severe obesity with a BMI of over 40, who reports snoring and excessive daytime somnolence.  His Epworth sleepiness score is 14 out of 24, fatigue severity score is 52 out of 63.  I reviewed your office note from 09/01/2024.  He is being evaluated for bariatric surgery, particularly gastric bypass.  His wife has occasionally noticed breathing pauses while he is asleep.  He has nocturia about once per average night and denies recurrent nocturnal or morning headaches.  He lives with his wife and 60 year old grandson.  They do have a TV in the bedroom and it tends to stay on at night per wife's preference.  They have a cat in the household and the cat typically does sleep on the bed at night.  He has a bedtime of around 8 and rise time around 5.  He does not currently work, he is on disability.  He is not aware of any family history of sleep apnea.  He does not drink any caffeine daily.  He quit smoking about 10 years ago and does not drink any alcohol.  His Past Medical History Is Significant For: Past Medical History:  Diagnosis Date   Arthritis    Chronic bilateral low back pain with right-sided sciatica    ED (erectile dysfunction)    GERD (gastroesophageal reflux disease)     Hx of adenomatous colonic polyps 10/14/2023   2019 4 polyps max 11 mm - all adenomas 2007 7 mm polyp removed, not recovered, proctitis  10/14/2023 7 polyps max 10 mm     Hydronephrosis of right kidney    Hypertension    followed by pcp   (nuclear test study 02-28-2010 in epic, normal no ischemia, nuclear ef 51%)   Pre-diabetes    Prostate cancer (HCC)    urologist--dr newsome   -- dx 04/ 2022,   started ADT 06-14-2021   Wears glasses     His Past Surgical History Is Significant For: Past Surgical History:  Procedure Laterality Date   COLONOSCOPY  02/04/2006   Dr.Jacobs   CYSTOSCOPY W/ URETERAL STENT PLACEMENT Bilateral 06/18/2021   Procedure: CYSTOSCOPY WITH BILATERAL RETROGRADE PYELOGRAM/ RIGHT URETERAL STENT PLACEMENT;  Surgeon: Rosalind Zachary NOVAK, MD;  Location: [facility];  Service: Urology;  Laterality: Bilateral;   LAPAROSCOPIC CHOLECYSTECTOMY  01/20/2008   @ [facility]   TOTAL KNEE ARTHROPLASTY Right 07/16/2018   Procedure: RIGHT TOTAL KNEE REPLACEMENT;  Surgeon: Barbarann Oneil BROCKS, MD;  Location: [facility] OR;  Service: Orthopedics;  Laterality: Right;    His Family History Is Significant For: Family History  Problem Relation Age of Onset   Colon polyps Father    Cancer Father        prostate    Colon cancer Father 71   Colon polyps Maternal Uncle  Colon cancer Maternal Uncle    Cancer Maternal Grandfather        prostate   Colon polyps Paternal Grandfather    Colon cancer Paternal Grandfather    Esophageal cancer Neg Hx    Rectal cancer Neg Hx    Stomach cancer Neg Hx     His Social History Is Significant For: Social History   Socioeconomic History   Marital status: Married    Spouse name: Not on file   Number of children: Not on file   Years of education: Not on file   Highest education level: Not on file  Occupational History   Not on file  Tobacco Use   Smoking status: Former    Current packs/day: 0.00    Types: Cigarettes    Start date:  11/19/2005    Quit date: 11/20/2015    Years since quitting: 8.8   Smokeless tobacco: Never  Vaping Use   Vaping status: Never Used  Substance and Sexual Activity   Alcohol use: No   Drug use: No   Sexual activity: Yes    Birth control/protection: None  Other Topics Concern   Not on file  Social History Narrative   1-2 cups of soda a week, no coffee or tea    Social Drivers of Corporate investment banker Strain: Low Risk  (11/05/2023)   Overall Financial Resource Strain (CARDIA)    Difficulty of Paying Living Expenses: Not hard at all  Food Insecurity: No Food Insecurity (11/05/2023)   Hunger Vital Sign    Worried About Running Out of Food in the Last Year: Never true    Ran Out of Food in the Last Year: Never true  Transportation Needs: No Transportation Needs (11/05/2023)   PRAPARE - Administrator, Civil Service (Medical): No    Lack of Transportation (Non-Medical): No  Physical Activity: Sufficiently Active (11/05/2023)   Exercise Vital Sign    Days of Exercise per Week: 7 days    Minutes of Exercise per Session: 30 min  Stress: No Stress Concern Present (11/05/2023)   [employer] of Occupational Health - Occupational Stress Questionnaire    Feeling of Stress : Not at all  Social Connections: Moderately Integrated (11/05/2023)   Social Connection and Isolation Panel    Frequency of Communication with Friends and Family: More than three times a week    Frequency of Social Gatherings with Friends and Family: More than three times a week    Attends Religious Services: More than 4 times per year    Active Member of [employer] or Organizations: No    Attends Banker Meetings: Never    Marital Status: Married    His Allergies Are:  No Known Allergies:   His Current Medications Are:  Outpatient Encounter Medications as of 10/05/2024  Medication Sig   acetaminophen  (TYLENOL ) 500 MG tablet Take 500 mg by mouth every 6 (six) hours as needed for moderate  pain or headache.   aspirin  EC 325 MG EC tablet Take 1 tablet (325 mg total) by mouth daily with breakfast.   clotrimazole  (LOTRIMIN  AF) 1 % cream Apply 1 Application topically 2 (two) times daily. Apply until resolution.   losartan -hydrochlorothiazide  (HYZAAR ) 100-12.5 MG tablet Take 1 tablet by mouth daily.   XTANDI  40 MG capsule Take 160 mg by mouth daily.   diclofenac  Sodium (VOLTAREN ) 1 % GEL Apply 2 g topically 4 (four) times daily. (Patient not taking: Reported on 08/17/2024)   HARVONI   90-400 MG TABS Take 1 tablet by mouth daily. (Patient not taking: Reported on 10/05/2024)   ibuprofen  (ADVIL ) 200 MG tablet Take 200 mg by mouth every 6 (six) hours as needed for headache or moderate pain. (Patient not taking: Reported on 08/17/2024)   pantoprazole  (PROTONIX ) 20 MG tablet TAKE 1 TABLET (20 MG TOTAL) BY MOUTH DAILY. (Patient not taking: Reported on 08/17/2024)   tadalafil  (CIALIS ) 20 MG tablet Take 1 tablet (20 mg total) by mouth daily as needed for up to 30 days for Erectile Dysfunction. (Patient not taking: Reported on 10/05/2024)   tamsulosin  (FLOMAX ) 0.4 MG CAPS capsule Take 1 capsule (0.4 mg total) by mouth daily. (Patient not taking: Reported on 08/17/2024)   No facility-administered encounter medications on file as of 10/05/2024.  :   Review of Systems:  Out of a complete 14 point review of systems, all are reviewed and negative with the exception of these symptoms as listed below:  Review of Systems  Neurological:        Patient is here alone for sleep - sometimes he wakes up at night. Some snoring  ESS- 14 FSS- 52    Objective:  Neurological Exam  Physical Exam Physical Examination:   Vitals:   10/05/24 1248  BP: 138/88  Pulse: 88  SpO2: 97%    General Examination: The patient is a very pleasant 58 y.o. male in no acute distress. He appears well-developed and well-nourished and well groomed.   HEENT: Normocephalic, atraumatic, pupils are equal, round and reactive to  light, extraocular tracking is good without limitation to gaze excursion or nystagmus noted. No photophobia.  Corrective eye glasses in place. Hearing is grossly intact.  Face is symmetric with normal facial animation. Speech is clear without dysarthria. There is no hypophonia. There is no lip, neck/head, jaw or voice tremor. Neck is supple with full range of passive and active motion. There are no carotid bruits on auscultation.  Airway/Oropharynx exam reveals: mild mouth dryness, good dental hygiene and moderate airway crowding, due to small airway entry and thicker soft palate, larger uvula, tonsillar size of 2+ on the right and 1-2+ on the left, Mallampati class III, neck circumference 17-7/8 inches.  Tongue protrudes centrally and palate elevates symmetrically.  Chest: Clear to auscultation without wheezing, rhonchi or crackles noted.  Heart: S1+S2+0, regular and normal without murmurs, rubs or gallops noted.   Abdomen: Soft, non-tender and non-distended.  Extremities: There is no pitting edema in the distal lower extremities bilaterally.   Skin: Warm and dry without trophic changes noted.   Musculoskeletal: exam reveals no obvious joint deformities.   Neurologically:  Mental status: The patient is awake, alert and oriented in all 4 spheres. His immediate and remote memory, attention, language skills and fund of knowledge are appropriate. There is no evidence of aphasia, agnosia, apraxia or anomia. Speech is clear with normal prosody and enunciation. Thought process is linear. Mood is normal and affect is normal.  Cranial nerves II - XII are as described above under HEENT exam.  Motor exam: Normal bulk, strength and tone is noted. There is no obvious action or resting tremor.  Fine motor skills and coordination: Intact grossly.  Cerebellar testing: No dysmetria or intention tremor. There is no truncal or gait ataxia.  Sensory exam: intact to light touch in the upper and lower extremities.   Gait, station and balance: He stands easily. No veering to one side is noted. No leaning to one side is noted. Posture is age-appropriate and  stance is narrow based. Gait shows normal stride length and normal pace. No problems turning are noted.   Assessment and Plan:   In summary, [name] is a 58 year old male with an underlying medical history of hypertension, prostate cancer, arthritis, with status post right total knee replacement in 2019, low back pain, reflux disease, ED, prediabetes, history of right kidney hydronephrosis, prior smoking, and severe obesity with a BMI of over 40, whose history and physical exam are concerning for sleep disordered breathing, particularly obstructive sleep apnea (OSA). A laboratory attended sleep study is typically considered gold standard for evaluation of sleep disordered breathing.   I had a long chat with the patient about my findings and the diagnosis of sleep apnea, particularly OSA, its prognosis and treatment options. We talked about medical/conservative treatments, surgical interventions and non-pharmacological approaches for symptom control. I explained, in particular, the risks and ramifications of untreated moderate to severe OSA, especially with respect to developing cardiovascular disease down the road, including congestive heart failure (CHF), difficult to treat hypertension, cardiac arrhythmias (particularly A-fib), neurovascular complications including TIA, stroke and dementia. Even type 2 diabetes has, in part, been linked to untreated OSA. Symptoms of untreated OSA may include (but may not be limited to) daytime sleepiness, nocturia (i.e. frequent nighttime urination), memory problems, mood irritability and suboptimally controlled or worsening mood disorder such as depression and/or anxiety, lack of energy, lack of motivation, physical discomfort, as well as recurrent headaches, especially morning or nocturnal headaches. We talked about the  importance of maintaining a healthy lifestyle and striving for healthy weight. In addition, we talked about the importance of striving for and maintaining good sleep hygiene. I recommended a sleep study at this time. I outlined the differences between a laboratory attended sleep study which is considered more comprehensive and accurate over the option of a home sleep test (HST); the latter may lead to underestimation of sleep disordered breathing in some instances and does not help with diagnosing upper airway resistance syndrome and is not accurate enough to diagnose primary central sleep apnea typically. I outlined possible surgical and non-surgical treatment options of OSA, including the use of a positive airway pressure (PAP) device (i.e. CPAP, AutoPAP/APAP or BiPAP in certain circumstances), a custom-made dental device (aka oral appliance, which would require a referral to a specialist dentist or orthodontist typically, and is generally speaking not considered for patients with full dentures or edentulous state), upper airway surgical options, such as traditional UPPP (which is not considered a first-line treatment) or the Inspire device (hypoglossal nerve stimulator, which would involve a referral for consultation with an ENT surgeon, after careful selection, following inclusion criteria - also not first-line treatment). I explained the PAP treatment option to the patient in detail, as this is generally considered first-line treatment.  The patient indicated that he would be willing to try PAP therapy, if the need arises.   We will pick up our discussion about the next steps and treatment options after testing.  We will keep him posted as to the test results by phone call and/or MyChart messaging where possible.  We will plan to follow-up in sleep clinic accordingly as well.  I answered all his questions today and the patient was in agreement.   I encouraged him to call with any interim questions,  concerns, problems or updates or email us  through MyChart.  Generally speaking, sleep test authorizations may take up to 2 weeks, sometimes less, sometimes longer, the patient is encouraged to get in  touch with us  if they do not hear back from the sleep lab staff directly within the next 2 weeks.  Thank you very much for allowing me to participate in the care of this nice patient. If I can be of any further assistance to you please do not hesitate to call me at [phone].  Sincerely,   True Mar, MD, PhD

## 2024-10-11 ENCOUNTER — Other Ambulatory Visit: Payer: Self-pay

## 2024-10-21 ENCOUNTER — Other Ambulatory Visit: Payer: Self-pay | Admitting: Pharmacist

## 2024-10-21 NOTE — Progress Notes (Signed)
 Specialty Pharmacy Ongoing Clinical Assessment Note  [name] is a 58 y.o. male who is being followed by the specialty pharmacy service for RxSp Hepatitis C   Patient's specialty medication(s) reviewed today: Ledipasvir -Sofosbuvir  (Harvoni )   Missed doses in the last 4 weeks: 0   Patient/Caregiver did not have any additional questions or concerns.   Therapeutic benefit summary: Patient is achieving benefit   Adverse events/side effects summary: No adverse events/side effects   Patient's therapy is appropriate to: Discontinue    Goals Addressed             This Visit's Progress    COMPLETED: Achieve virologic cure as evidenced by SVR       Patient is initiating therapy. Patient will be evaluated at upcoming provider appointment to assess progress      Maintain optimal adherence to therapy   On track    Patient is initiating therapy. Patient will maintain adherence         Follow up: none required -therapy completed  Alan JINNY Geralds Specialty Pharmacist

## 2024-10-31 ENCOUNTER — Ambulatory Visit: Admitting: Dietician

## 2024-11-01 ENCOUNTER — Encounter: Payer: Self-pay | Admitting: Family Medicine

## 2024-11-01 ENCOUNTER — Ambulatory Visit (INDEPENDENT_AMBULATORY_CARE_PROVIDER_SITE_OTHER): Admitting: Family Medicine

## 2024-11-01 VITALS — BP 147/78 | HR 79 | Ht 70.0 in | Wt 299.2 lb

## 2024-11-01 DIAGNOSIS — L989 Disorder of the skin and subcutaneous tissue, unspecified: Secondary | ICD-10-CM | POA: Insufficient documentation

## 2024-11-01 DIAGNOSIS — L03039 Cellulitis of unspecified toe: Secondary | ICD-10-CM | POA: Insufficient documentation

## 2024-11-01 DIAGNOSIS — M1711 Unilateral primary osteoarthritis, right knee: Secondary | ICD-10-CM | POA: Diagnosis not present

## 2024-11-01 DIAGNOSIS — I10 Essential (primary) hypertension: Secondary | ICD-10-CM | POA: Diagnosis not present

## 2024-11-01 DIAGNOSIS — M25473 Effusion, unspecified ankle: Secondary | ICD-10-CM | POA: Diagnosis not present

## 2024-11-01 LAB — POCT URINALYSIS DIP (MANUAL ENTRY)
Bilirubin, UA: NEGATIVE
Blood, UA: NEGATIVE
Glucose, UA: NEGATIVE mg/dL
Leukocytes, UA: NEGATIVE
Nitrite, UA: NEGATIVE
Protein Ur, POC: NEGATIVE mg/dL
Spec Grav, UA: 1.02 (ref 1.010–1.025)
Urobilinogen, UA: 0.2 U/dL
pH, UA: 7 (ref 5.0–8.0)

## 2024-11-01 NOTE — Assessment & Plan Note (Signed)
 Persistent though improved slightly from prior with unknown cream. He will return in 2-4 weeks as above to follow up with likely biopsy. Ddx includes cryoglobulinemia, lichen planus, mycosis fungoides, especially with his history of HCV.

## 2024-11-01 NOTE — Assessment & Plan Note (Signed)
 Elevated likely in setting of pain as below. Recheck in 2-4 weeks.

## 2024-11-01 NOTE — Assessment & Plan Note (Signed)
 Osteoarthritis affects both knees, with left knee pain improving. The right knee has been replaced. Pain is managed with Tylenol  and topical rubs. Offer a steroid injection for the left knee if pain persists or worsens. Advise returning if knee pain does not improve.

## 2024-11-01 NOTE — Progress Notes (Signed)
   SUBJECTIVE:   CHIEF COMPLAINT / HPI:  Discussed the use of AI scribe software for clinical note transcription with the patient, who gave verbal consent to proceed.  History of Present Illness [name] is a 58 year old male with arthritis who presents with bilateral ankle swelling and knee pain.  Swelling began in both ankle a week ago. The swelling is noticeable and does not improve with sleep. There is no associated pain. His wife has observed swelling in his hands. He experiences possibly frothy urine but denies swelling in the arms, abdomen, or face.  Knee pain has intensified recently, making ambulation difficult and requiring a cane. Tylenol  and topical Tiger Balm have provided good relief. It is mostly gone now.  Both big toes are painful, complicating shoe wear, leading him to use slides. He mentions his ingrown toenails.  He applies an ointment to a leg skin lesion once or twice daily, noting some improvement since the last visit.    PERTINENT  PMH / PSH: prostate cancer, treated HCV  OBJECTIVE:  BP (!) 144/75   Pulse 81   Ht 5' 10 (1.778 m)   Wt 299 lb 3.2 oz (135.7 kg)   SpO2 99%   BMI 42.93 kg/m   Physical Exam GENERAL: Alert, cooperative, well developed, no acute distress. CHEST: Clear to auscultation bilaterally, no wheezes, rhonchi, or crackles. CARDIOVASCULAR: Normal heart rate and rhythm, S1 and S2 normal without murmurs. 2+ bilateral DP pulses. ABDOMEN: Soft, non-tender, non-distended, without organomegaly, normal bowel sounds. EXTREMITIES: No cyanosis. Trace pitting edema on bilateral leg with non-pitting edema of bilateral ankles without TTP or bruising MUSCULOSKELETAL: Previously-documented lesions of the legs, slightly improved from prior NEUROLOGICAL: Cranial nerves grossly intact, moves all extremities without gross motor or sensory deficit.  ASSESSMENT/PLAN:   Assessment & Plan Swelling of ankle joint, unspecified laterality Edema is likely  from chronic venous insufficiency. However, with orthopnea, will evaluate with BNP. If abnormal, can likely cancel echocardiogram. Will also obtain CMP to better evaluate nephrotic syndrome (UA without protein today and overall bland). Recommend compression stockings. Advise leg elevation and instruct to call if symptoms worsen or if dyspnea or chest pain occur before results follow up. Essential hypertension Elevated likely in setting of pain as below. Recheck in 2-4 weeks. Arthritis of right knee Osteoarthritis affects both knees, with left knee pain improving. The right knee has been replaced. Pain is managed with Tylenol  and topical rubs. Offer a steroid injection for the left knee if pain persists or worsens. Advise returning if knee pain does not improve. Inflammation of toenail, unspecified laterality Painful bilateral great toenails are possibly ingrown, causing difficulty wearing shoes. He will return for toenail removal once swelling is better controlled. Leg lesion Persistent though improved slightly from prior with unknown cream. He will return in 2-4 weeks as above to follow up with likely biopsy. Ddx includes cryoglobulinemia, lichen planus, mycosis fungoides, especially with his history of HCV.  Stuart Redo, MD [facility]

## 2024-11-01 NOTE — Assessment & Plan Note (Signed)
 Edema is likely from chronic venous insufficiency. However, with orthopnea, will evaluate with BNP. If abnormal, can likely cancel echocardiogram. Will also obtain CMP to better evaluate nephrotic syndrome (UA without protein today and overall bland). Recommend compression stockings. Advise leg elevation and instruct to call if symptoms worsen or if dyspnea or chest pain occur before results follow up.

## 2024-11-01 NOTE — Assessment & Plan Note (Signed)
 Painful bilateral great toenails are possibly ingrown, causing difficulty wearing shoes. He will return for toenail removal once swelling is better controlled.

## 2024-11-01 NOTE — Patient Instructions (Signed)
  VISIT SUMMARY: Today, we addressed your bilateral ankle swelling, knee pain, painful toenails, and chronic skin lesions. We discussed potential causes and treatment options for each issue.  YOUR PLAN: BILATERAL LOWER EXTREMITY EDEMA: Your ankle swelling is likely due to chronic venous insufficiency. Heart failure and nephrotic syndrome are less likely. -Wear compression stockings. -Get blood work and a urine sample done. -Schedule an echocardiogram. -Elevate your legs when possible. -Call us  if your symptoms worsen or if you experience shortness of breath or chest pain.  OSTEOARTHRITIS OF BILATERAL KNEES: You have osteoarthritis in both knees, with the right knee already replaced. The left knee pain is improving. -Continue using Tylenol  and topical rubs for pain relief. -Consider a steroid injection for the left knee if the pain persists or worsens. -Return to the clinic if your knee pain does not improve.  PAINFUL BILATERAL GREAT TOENAILS: Your painful toenails may be ingrown, making it hard to wear shoes. -We can consider toenail removal if the pain continues after the swelling goes down.  CHRONIC SKIN LESIONS OF LEG: Your leg skin lesions are showing some improvement with the ointment you are using. -Continue applying the current ointment once or twice daily. -Consider biopsy in the future if does not improve.  Please let me know if you have any other questions.  Dr. Tharon

## 2024-11-03 ENCOUNTER — Ambulatory Visit: Payer: Self-pay | Admitting: Family Medicine

## 2024-11-03 NOTE — Progress Notes (Signed)
 Noted normal BNP on labs. Unlikely CHF causing swelling. Will cancel echo and can consider ordering again as clinically indicated in the future; [name], no need to schedule. Thanks!

## 2024-11-04 ENCOUNTER — Ambulatory Visit
Admission: RE | Admit: 2024-11-04 | Discharge: 2024-11-04 | Disposition: A | Discharge status: Home / Self Care | Source: Ambulatory Visit | Attending: Family Medicine | Admitting: Family Medicine

## 2024-11-04 DIAGNOSIS — Z87891 Personal history of nicotine dependence: Secondary | ICD-10-CM

## 2024-11-04 LAB — COMPREHENSIVE METABOLIC PANEL WITH GFR
ALT: 13 IU/L (ref 0–44)
AST: 16 IU/L (ref 0–40)
Albumin: 4.3 g/dL (ref 3.8–4.9)
Alkaline Phosphatase: 92 IU/L (ref 47–123)
BUN/Creatinine Ratio: 14 (ref 9–20)
BUN: 12 mg/dL (ref 6–24)
Bilirubin Total: 0.2 mg/dL (ref 0.0–1.2)
CO2: 21 mmol/L (ref 20–29)
Calcium: 9.9 mg/dL (ref 8.7–10.2)
Chloride: 97 mmol/L (ref 96–106)
Creatinine, Ser: 0.88 mg/dL (ref 0.76–1.27)
Globulin, Total: 2.9 g/dL (ref 1.5–4.5)
Glucose: 107 mg/dL — AB (ref 70–99)
Potassium: 3.6 mmol/L (ref 3.5–5.2)
Sodium: 140 mmol/L (ref 134–144)
Total Protein: 7.2 g/dL (ref 6.0–8.5)
eGFR: 99 mL/min/1.73 (ref >59)

## 2024-11-04 LAB — BRAIN NATRIURETIC PEPTIDE: BNP: 23.2 pg/mL (ref 0.0–100.0)

## 2024-11-07 ENCOUNTER — Ambulatory Visit (INDEPENDENT_AMBULATORY_CARE_PROVIDER_SITE_OTHER): Payer: Medicare Other

## 2024-11-07 ENCOUNTER — Telehealth: Payer: Self-pay | Admitting: Neurology

## 2024-11-07 VITALS — Ht 70.0 in | Wt 300.0 lb

## 2024-11-07 DIAGNOSIS — Z0000 Encounter for general adult medical examination without abnormal findings: Secondary | ICD-10-CM | POA: Diagnosis not present

## 2024-11-07 NOTE — Telephone Encounter (Signed)
 I called the patient again and left a voicemail also sent mychart   11/02/24 [facility] [place] 11/01/24 [facility] [place] 10/10: [employer] Medicare/medicaid no auth [id number]

## 2024-11-07 NOTE — Patient Instructions (Signed)
 Mr. [name],  Thank you for taking the time for your Medicare Wellness Visit. I appreciate your continued commitment to your health goals. Please review the care plan we discussed, and feel free to reach out if I can assist you further.  Please note that Annual Wellness Visits do not include a physical exam. Some assessments may be limited, especially if the visit was conducted virtually. If needed, we may recommend an in-person follow-up with your provider.  Ongoing Care Seeing your primary care provider every 3 to 6 months helps us  monitor your health and provide consistent, personalized care.   Referrals If a referral was made during today's visit and you haven't received any updates within two weeks, please contact the referred provider directly to check on the status.  Recommended Screenings:  Health Maintenance  Topic Date Due   Pneumococcal Vaccine for age over 60 (1 of 2 - PCV) Never done   Zoster (Shingles) Vaccine (1 of 2) Never done   Flu Shot  07/29/2024   COVID-19 Vaccine (4 - 2025-26 season) 08/29/2024   Screening for Lung Cancer  11/02/2024   Medicare Annual Wellness Visit  11/04/2024   Colon Cancer Screening  10/13/2026   DTaP/Tdap/Td vaccine (2 - Td or Tdap) 01/02/2027   Hepatitis B Vaccine  Completed   Hepatitis C Screening  Completed   HIV Screening  Completed   HPV Vaccine  Aged Out   Meningitis B Vaccine  Aged Out       11/07/2024    9:57 AM  Advanced Directives  Does Patient Have a Medical Advance Directive? No  Would patient like information on creating a medical advance directive? No - Patient declined    Vision: Annual vision screenings are recommended for early detection of glaucoma, cataracts, and diabetic retinopathy. These exams can also reveal signs of chronic conditions such as diabetes and high blood pressure.  Dental: Annual dental screenings help detect early signs of oral cancer, gum disease, and other conditions linked to overall health,  including heart disease and diabetes.  Please see the attached documents for additional preventive care recommendations.

## 2024-11-07 NOTE — Progress Notes (Signed)
 I connected with  Theodis Kinsel Daws on 11/07/24 by a audio enabled telemedicine application and verified that I am speaking with the correct person using two identifiers.  Patient Location: Other:  [facility]  Provider Location: Office/Clinic  I discussed the limitations of evaluation and management by telemedicine. The patient expressed understanding and agreed to proceed.  Subjective:   [name] is a 58 y.o. male who presents for a Medicare Annual Wellness Visit.  Allergies (verified) Patient has no known allergies.   History: Past Medical History:  Diagnosis Date   Arthritis    Chronic bilateral low back pain with right-sided sciatica    ED (erectile dysfunction)    GERD (gastroesophageal reflux disease)    Hx of adenomatous colonic polyps 10/14/2023   2019 4 polyps max 11 mm - all adenomas 2007 7 mm polyp removed, not recovered, proctitis  10/14/2023 7 polyps max 10 mm     Hydronephrosis of right kidney    Hypertension    followed by pcp   (nuclear test study 02-28-2010 in epic, normal no ischemia, nuclear ef 51%)   Pre-diabetes    Prostate cancer (HCC)    urologist--dr newsome   -- dx 04/ 2022,   started ADT 06-14-2021   Wears glasses    Past Surgical History:  Procedure Laterality Date   COLONOSCOPY  02/04/2006   Dr.Jacobs   CYSTOSCOPY W/ URETERAL STENT PLACEMENT Bilateral 06/18/2021   Procedure: CYSTOSCOPY WITH BILATERAL RETROGRADE PYELOGRAM/ RIGHT URETERAL STENT PLACEMENT;  Surgeon: Rosalind Zachary NOVAK, MD;  Location: [facility];  Service: Urology;  Laterality: Bilateral;   LAPAROSCOPIC CHOLECYSTECTOMY  01/20/2008   @ [facility]   TOTAL KNEE ARTHROPLASTY Right 07/16/2018   Procedure: RIGHT TOTAL KNEE REPLACEMENT;  Surgeon: Barbarann Oneil BROCKS, MD;  Location: [facility] OR;  Service: Orthopedics;  Laterality: Right;   Family History  Problem Relation Age of Onset   Colon polyps Father    Cancer Father        prostate    Colon cancer Father 32   Colon polyps Maternal  Uncle    Colon cancer Maternal Uncle    Cancer Maternal Grandfather        prostate   Colon polyps Paternal Grandfather    Colon cancer Paternal Grandfather    Esophageal cancer Neg Hx    Rectal cancer Neg Hx    Stomach cancer Neg Hx    Social History   Occupational History   Not on file  Tobacco Use   Smoking status: Former    Current packs/day: 0.00    Types: Cigarettes    Start date: 11/19/2005    Quit date: 11/20/2015    Years since quitting: 8.9   Smokeless tobacco: Never  Vaping Use   Vaping status: Never Used  Substance and Sexual Activity   Alcohol use: No   Drug use: No   Sexual activity: Yes    Birth control/protection: None   Tobacco Counseling Counseling given: Not Answered  SDOH Screenings   Food Insecurity: No Food Insecurity (11/07/2024)  Housing: Low Risk  (11/07/2024)  Transportation Needs: No Transportation Needs (11/07/2024)  Utilities: Not At Risk (11/07/2024)  Alcohol Screen: Low Risk  (11/05/2023)  Depression (PHQ2-9): Low Risk  (11/07/2024)  Recent Concern: Depression (PHQ2-9) - Medium Risk (08/17/2024)  Financial Resource Strain: Low Risk  (11/05/2023)  Physical Activity: Sufficiently Active (11/07/2024)  Social Connections: Moderately Integrated (11/07/2024)  Stress: No Stress Concern Present (11/07/2024)  Tobacco Use: Medium Risk (11/07/2024)  Health  Literacy: Adequate Health Literacy (11/07/2024)   Depression Screen    11/07/2024   10:05 AM 11/01/2024    1:53 PM 09/13/2024    2:14 PM 08/17/2024    8:59 AM 12/07/2023    2:49 PM 11/05/2023    2:35 PM 10/13/2023    2:33 PM  PHQ 2/9 Scores  PHQ - 2 Score 1 1 0 2 0 0 1  PHQ- 9 Score 4 4   8    0  5      Data saved with a previous flowsheet row definition      Goals Addressed             This Visit's Progress    11/07/2024: My goal is to lose 80 pounds.         Visit info / Clinical Intake: Medicare Wellness Visit Type:: Subsequent Annual Wellness Visit Medicare Wellness Visit  Mode:: Telephone If telephone:: video error If telephone or video:: pt reported vitals Interpreter Needed?: No Pre-visit prep was completed: yes AWV questionnaire completed by patient prior to visit?: no Living arrangements:: lives with spouse/significant other Patient's Overall Health Status Rating: (!) fair Typical amount of pain: some Does pain affect daily life?: (!) yes Are you currently prescribed opioids?: no  Dietary Habits and Nutritional Risks How many meals a day?: 3 Eats fruit and vegetables daily?: yes Most meals are obtained by: preparing own meals In the last 2 weeks, have you had any of the following?: -- (NO) Diabetic:: no  Functional Status Activities of Daily Living (to include ambulation/medication): Independent Ambulation: Independent with device- listed below Home Assistive Devices/Equipment: Eyeglasses Medication Administration: Independent Home Management: Independent Manage your own finances?: yes Primary transportation is: driving Concerns about vision?: no *vision screening is required for WTM* Concerns about hearing?: no  Fall Screening Falls in the past year?: 0 Number of falls in past year: 0 Was there an injury with Fall?: 0 Fall Risk Category Calculator: 0 Patient Fall Risk Level: Low Fall Risk  Fall Risk Patient at Risk for Falls Due to: No Fall Risks Fall risk Follow up: Falls evaluation completed; Education provided  Home and Transportation Safety: All rugs have non-skid backing?: yes All stairs or steps have railings?: N/A, no stairs Grab bars in the bathtub or shower?: (!) no Have non-skid surface in bathtub or shower?: (!) no Good home lighting?: yes Regular seat belt use?: yes Hospital stays in the last year:: no  Cognitive Assessment Difficulty concentrating, remembering, or making decisions? : no Will 6CIT or Mini Cog be Completed: no 6CIT or Mini Cog Declined: patient alert, oriented, able to answer questions appropriately  and recall recent events  Advance Directives (For Healthcare) Does Patient Have a Medical Advance Directive?: No Would patient like information on creating a medical advance directive?: No - Patient declined  Reviewed/Updated  Reviewed/Updated: All        Objective:    Today's Vitals   11/07/24 0955  Weight: 300 lb (136.1 kg)  Height: 5' 10 (1.778 m)  PainSc: 9   PainLoc: Knee   Body mass index is 43.05 kg/m.  Current Medications (verified) Outpatient Encounter Medications as of 11/07/2024  Medication Sig   acetaminophen  (TYLENOL ) 500 MG tablet Take 500 mg by mouth every 6 (six) hours as needed for moderate pain or headache.   aspirin  EC 325 MG EC tablet Take 1 tablet (325 mg total) by mouth daily with breakfast.   clotrimazole  (LOTRIMIN  AF) 1 % cream Apply 1 Application topically 2 (two)  times daily. Apply until resolution.   losartan -hydrochlorothiazide  (HYZAAR ) 100-12.5 MG tablet Take 1 tablet by mouth daily.   XTANDI  40 MG capsule Take 160 mg by mouth daily.   diclofenac  Sodium (VOLTAREN ) 1 % GEL Apply 2 g topically 4 (four) times daily. (Patient not taking: Reported on 08/17/2024)   HARVONI  90-400 MG TABS Take 1 tablet by mouth daily. (Patient not taking: Reported on 10/05/2024)   ibuprofen  (ADVIL ) 200 MG tablet Take 200 mg by mouth every 6 (six) hours as needed for headache or moderate pain. (Patient not taking: Reported on 08/17/2024)   pantoprazole  (PROTONIX ) 20 MG tablet TAKE 1 TABLET (20 MG TOTAL) BY MOUTH DAILY. (Patient not taking: Reported on 08/17/2024)   tadalafil  (CIALIS ) 20 MG tablet Take 1 tablet (20 mg total) by mouth daily as needed for up to 30 days for Erectile Dysfunction. (Patient not taking: Reported on 10/05/2024)   tamsulosin  (FLOMAX ) 0.4 MG CAPS capsule Take 1 capsule (0.4 mg total) by mouth daily. (Patient not taking: Reported on 08/17/2024)   No facility-administered encounter medications on file as of 11/07/2024.   Hearing/Vision screen Hearing  Screening - Comments:: Denies hearing difficulties.  Vision Screening - Comments:: Wears rx glasses - up to date with routine eye exams with [facility]  Immunizations and Health Maintenance Health Maintenance  Topic Date Due   Pneumococcal Vaccine: 50+ Years (1 of 2 - PCV) Never done   Zoster Vaccines- Shingrix (1 of 2) Never done   Influenza Vaccine  07/29/2024   COVID-19 Vaccine (4 - 2025-26 season) 08/29/2024   Lung Cancer Screening  11/02/2024   Medicare Annual Wellness (AWV)  11/07/2025   Colonoscopy  10/13/2026   DTaP/Tdap/Td (2 - Td or Tdap) 01/02/2027   Hepatitis B Vaccines 19-59 Average Risk  Completed   Hepatitis C Screening  Completed   HIV Screening  Completed   HPV VACCINES  Aged Out   Meningococcal B Vaccine  Aged Out        Assessment/Plan:  This is a routine wellness examination for [place].  Patient Care Team: Tharon Lung, MD as PCP - General (Family Medicine) Buck Saucer, MD as Attending Physician (Neurology) Delores Mabel LABOR, RD as Dietitian (Dietician)  I have personally reviewed and noted the following in the patient's chart:   Medical and social history Use of alcohol, tobacco or illicit drugs  Current medications and supplements including opioid prescriptions. Functional ability and status Nutritional status Physical activity Advanced directives List of other physicians Hospitalizations, surgeries, and ER visits in previous 12 months Vitals Screenings to include cognitive, depression, and falls Referrals and appointments  No orders of the defined types were placed in this encounter.  In addition, I have reviewed and discussed with patient certain preventive protocols, quality metrics, and best practice recommendations. A written personalized care plan for preventive services as well as general preventive health recommendations were provided to patient.   Roz LOISE Fuller, LPN   [id number]/89/7974   Return in about 1 year (around  11/07/2025) for Medicare wellness.  After Visit Summary: (MyChart) Due to this being a telephonic visit, the after visit summary with patients personalized plan was offered to patient via MyChart   Nurse Notes: Patient is due for the following care gaps: Flu, Covid, Pneumococcal, Shingrix and Lung Cancer Screening. Patient is scheduled to see PCP 11/10/2024.

## 2024-11-10 ENCOUNTER — Encounter: Payer: Self-pay | Admitting: Family Medicine

## 2024-11-10 ENCOUNTER — Ambulatory Visit: Admitting: Family Medicine

## 2024-11-10 VITALS — BP 130/70 | HR 72 | Ht 70.0 in | Wt 300.2 lb

## 2024-11-10 DIAGNOSIS — L03039 Cellulitis of unspecified toe: Secondary | ICD-10-CM

## 2024-11-10 DIAGNOSIS — M25473 Effusion, unspecified ankle: Secondary | ICD-10-CM | POA: Diagnosis not present

## 2024-11-10 DIAGNOSIS — L989 Disorder of the skin and subcutaneous tissue, unspecified: Secondary | ICD-10-CM | POA: Diagnosis not present

## 2024-11-10 DIAGNOSIS — M170 Bilateral primary osteoarthritis of knee: Secondary | ICD-10-CM | POA: Diagnosis not present

## 2024-11-10 MED ORDER — METHYLPREDNISOLONE ACETATE 40 MG/ML IJ SUSP
40.0000 mg | Freq: Once | INTRAMUSCULAR | Status: AC
  Administered 2024-11-10: 40 mg via INTRAMUSCULAR

## 2024-11-10 NOTE — Progress Notes (Signed)
   SUBJECTIVE:   CHIEF COMPLAINT / HPI:   Bilateral knee pain, left osteoarthritis, hx R knee replacement Continues to be painful. Would like injections today. A1c 5.9 recently.  OBJECTIVE:  BP 130/70   Pulse 72   Ht 5' 10 (1.778 m)   Wt (!) 300 lb 3.2 oz (136.2 kg)   SpO2 90%   BMI 43.07 kg/m   General: Alert and oriented, in NAD HEENT: NCAT, EOM grossly normal, midline nasal septum Respiratory: Breathing and speaking comfortably on RA Extremities/MSK: Moves all extremities grossly equally, left knee TTP over medial joint line, pain with full ROM, no swelling/erythema appreciated today Neurological: No gross focal deficit Psychiatric: Appropriate mood and affect   ASSESSMENT/PLAN:   Assessment & Plan Primary osteoarthritis of both knees PROCEDURE:  Risks & benefits of injection reviewed. Consent obtained. Time-out completed. Left knee prepped and draped in the normal fashion. Area cleansed with alcohol. Ethyl chloride spray used to anesthetize the skin. Solution of 3 mL 1% lidocaine  with 1 mL methylprednisolone  (Depo-medrol ) 40mg /mL injected into the left knee using a 22-gauge 1.5-inch needle via the anterior medial approach. Patient tolerated procedure well without any complications. Area covered with adhesive bandage. Post-procedure care reviewed, all questions answered.   Swelling of ankle joint, unspecified laterality Improving with compression stockings. Prior workup reassuring against nephrotic syndrome and CHF. Leg lesion Inflammation of toenail, unspecified laterality He will follow back up for potential biopsy of leg lesion and toenail removal.  Stuart Redo, MD [facility]

## 2024-11-10 NOTE — Patient Instructions (Signed)
 We injected your left knee today. We did not inject the right knee since you have a replacement in that knee. I have included post care instructions here.

## 2024-11-10 NOTE — Assessment & Plan Note (Signed)
 Improving with compression stockings. Prior workup reassuring against nephrotic syndrome and CHF.

## 2024-11-10 NOTE — Assessment & Plan Note (Signed)
 He will follow back up for potential biopsy of leg lesion and toenail removal.

## 2024-11-10 NOTE — Assessment & Plan Note (Addendum)
 PROCEDURE:  Risks & benefits of injection reviewed. Consent obtained. Time-out completed. Left knee prepped and draped in the normal fashion. Area cleansed with alcohol. Ethyl chloride spray used to anesthetize the skin. Solution of 3 mL 1% lidocaine  with 1 mL methylprednisolone  (Depo-medrol ) 40mg /mL injected into the left knee using a 22-gauge 1.5-inch needle via the anterior medial approach. Patient tolerated procedure well without any complications. Area covered with adhesive bandage. Post-procedure care reviewed, all questions answered.

## 2024-11-11 ENCOUNTER — Encounter: Payer: Self-pay | Admitting: Family Medicine

## 2024-11-11 DIAGNOSIS — J439 Emphysema, unspecified: Secondary | ICD-10-CM | POA: Insufficient documentation

## 2024-11-11 DIAGNOSIS — I2510 Atherosclerotic heart disease of native coronary artery without angina pectoris: Secondary | ICD-10-CM | POA: Insufficient documentation

## 2024-11-17 ENCOUNTER — Telehealth: Payer: Self-pay

## 2024-11-17 NOTE — Telephone Encounter (Signed)
 Patient returns call to nurse line.   He reports he has been taking Tylenol , however with very minimal relief.   He reports the pain wraps around the back of his knee, he reports he can barley walk. He reports he felt relief with the steroid injection until today.   He denies any new trauma, swelling, warmth or redness.   Patient scheduled for tomorrow for evaluation.   Conservative measures discussed with patient.   ED precaution given.

## 2024-11-17 NOTE — Telephone Encounter (Signed)
 Patient leaves VM on nurse line requesting something for pain.   He reports despite his recent knee injection, he is still experiencing a lot of pain.   I attempted to call patient back to gather more information, however no answer.   Unsure what he has been using for pain control at this time.   Will forward to PCP.

## 2024-11-18 ENCOUNTER — Ambulatory Visit: Admitting: Family Medicine

## 2024-11-18 NOTE — Progress Notes (Deleted)
    SUBJECTIVE:   CHIEF COMPLAINT / HPI:   Knee Pain - 11/13: Patient presented for bilateral knee pain and L knee injection was performed -   Patient returns call to nurse line.    He reports he has been taking Tylenol , however with very minimal relief.    He reports the pain wraps around the back of his knee, he reports he can barley walk. He reports he felt relief with the steroid injection until today.    He denies any new trauma, swelling, warmth or redness.     PERTINENT  PMH / PSH: ***  OBJECTIVE:   There were no vitals taken for this visit.  ***  ASSESSMENT/PLAN:   Assessment & Plan      Kathrine Melena, DO [facility]

## 2024-11-21 ENCOUNTER — Ambulatory Visit: Admitting: Family Medicine

## 2024-11-22 ENCOUNTER — Encounter

## 2025-01-02 ENCOUNTER — Encounter: Attending: General Surgery | Admitting: Dietician

## 2025-01-02 ENCOUNTER — Encounter: Payer: Self-pay | Admitting: Dietician

## 2025-01-02 VITALS — Ht 70.0 in | Wt 308.9 lb

## 2025-01-02 DIAGNOSIS — E669 Obesity, unspecified: Secondary | ICD-10-CM | POA: Insufficient documentation

## 2025-01-02 NOTE — Progress Notes (Signed)
 Supervised Weight Loss Visit Bariatric Nutrition Education Appt Start Time: 1506    End Time: 1531  Planned surgery: RNY Gastric By-Pass Pt expectation of surgery: to lose at least 80 lbs  Referral stated Supervised Weight Loss (SWL) visits needed: 6 Months  2 out of 6 SWL Remaining Appointments   Planned surgery: RNY Gastric By-Pass Pt expectation of surgery: to lose at least 80 lbs  NUTRITION ASSESSMENT   Anthropometrics  Start weight at [facility]: 307.7 lbs (date: 09/13/2024)  Height: 70 in Weight today: 308.9 lbs BMI: 44.32 kg/m2     Clinical  Medical hx: cancer, obesity, HTN Medications: losartan , xtandi  Labs: sodium 133, glucose 119, hemoglobin 13.3, hematocrit 39.0, anion gap 5 Notable signs/symptoms: non noted Any previous deficiencies? No  Lifestyle & Dietary Hx  Pt arrived with his wife. Pt states he got mixed up on his appointment, stating he thought he was done. Pt states he joined a gym recently. Pt states he would not like any additional visits beyond what is required for bariatric surgery. Pt states that he is remembering the strategies and patterns from previous visits.  Estimated daily fluid intake: 64 oz (water ) Supplements:  Current average weekly physical activity: ADLs  24-Hr Dietary Recall First Meal: 9-10 am, bowl of cereal (frosted flakes) Snack: nutri-grain bar (to avoid alternate snack options) Second Meal: sandwich (toasted bread or grilled cheese or grilled turkey sandwich) Snack:  Third Meal: meat, mashed potatoes, vegetable (meals on wheel type food that his wife doesn't eat) Snack: rice crispy treat Beverages: water   Alcoholic beverages per week: 0   Estimated Energy Needs Calories: 1700  NUTRITION DIAGNOSIS  Overweight/obesity ([place]-3.3) related to past poor dietary habits and physical inactivity as evidenced by patient w/ planned gastric by-pass surgery following dietary guidelines for continued weight loss.  NUTRITION INTERVENTION   Nutrition counseling (C-1) and education (E-2) to facilitate bariatric surgery goals.  Pre-Op Goals Reviewed with the Patient  When youre preparing for bariatric surgery, building strong protein habits makes a big difference in how you feel both before and after the procedure. Aim for about 80 grams of protein per day, and include a protein source with every meal or snack to keep your energy steady and support muscle mass.  After bariatric surgery, fullness arrives much more quickly, so the goal shifts from eating until you feel full to eating until you feel satisfied--a comfortable stopping point that prevents discomfort and supports long-term success. Establishing a steady routine helps with this, starting with your first meal within 1-1.5 hours of waking to set your metabolism and protein intake in motion. Throughout the day, small, frequent meals keep your energy stable and make it easier to meet your nutrition goals without overwhelming your stomach. Using the Bariatric MyPlate as a guide--prioritizing protein first, then vegetables, then small portions of whole grains or fruit--helps you build balanced meals that fit your new portion sizes and support healthy habits moving forward.  Pre-Op Goals Progress & New Goals Re-engage: Eliminate distractions while eating (TV, computer, reading, driving, texting) Re-engage: Take 20-30 minutes to eat a meal  Re-engage: aiming for 64 ounces of water  Re-engage: eat every 3-5 hours; use meal planning and prepping to execute  New: aim for a protein with every meal or snack, to help get to 80 grams per day.  Handouts Provided Include  Bariatric MyPlate (second copy)  Learning Style & Readiness for Change Teaching method utilized: Visual & Auditory  Demonstrated degree of understanding via: Teach Back  Readiness  Level: preparation Barriers to learning/adherence to lifestyle change: reduced physical activity  RD's Notes for next Visit  Patient  progress toward chosen goals  MONITORING & EVALUATION Dietary intake, weekly physical activity, body weight, and pre-op goals.  Next Steps  Patient is to return to [facility] in 1 month for next SWL visit.

## 2025-01-15 ENCOUNTER — Ambulatory Visit: Admitting: Neurology

## 2025-01-15 DIAGNOSIS — R351 Nocturia: Secondary | ICD-10-CM

## 2025-01-15 DIAGNOSIS — R0683 Snoring: Secondary | ICD-10-CM | POA: Diagnosis not present

## 2025-01-15 DIAGNOSIS — G4733 Obstructive sleep apnea (adult) (pediatric): Secondary | ICD-10-CM

## 2025-01-15 DIAGNOSIS — G4720 Circadian rhythm sleep disorder, unspecified type: Secondary | ICD-10-CM

## 2025-01-15 DIAGNOSIS — G4719 Other hypersomnia: Secondary | ICD-10-CM

## 2025-01-15 DIAGNOSIS — Z9189 Other specified personal risk factors, not elsewhere classified: Secondary | ICD-10-CM

## 2025-01-15 DIAGNOSIS — R0681 Apnea, not elsewhere classified: Secondary | ICD-10-CM

## 2025-01-16 ENCOUNTER — Encounter: Payer: Self-pay | Admitting: Family Medicine

## 2025-01-16 ENCOUNTER — Ambulatory Visit: Payer: Self-pay | Admitting: Neurology

## 2025-01-16 DIAGNOSIS — G4733 Obstructive sleep apnea (adult) (pediatric): Secondary | ICD-10-CM | POA: Insufficient documentation

## 2025-01-16 NOTE — Procedures (Signed)
 Physician Interpretation: Please see link under Procedure Tab or under Encounters tab for physician report, technical report, as well as O2 titration and/or PAP titration tables (if applicable).

## 2025-01-19 NOTE — Telephone Encounter (Signed)
 Spoke to patient gave sleep study results . Pt chose [employer] for DME Gave pt [facility] # Pt aware of insurance compliance Gave pt initial  f/u with sarah,NP 03/2025 sent orders to [employer] and forward sleep study results to referring MD Pt expressed understanding and thanked me for calling

## 2025-01-19 NOTE — Telephone Encounter (Signed)
-----   Message from True Mar, MD sent at 01/16/2025 12:15 PM EST ----- Patient referred by Dr. Stevie, seen by me on 10/05/24, diagnostic PSG on 01/15/25.    Please call and notify the patient that the recent sleep study showed moderate to severe obstructive sleep apnea. I recommend treatment for in the form of autoPAP. We may consider at a CPAP titration  study at a later date, if need be, which means, that we would ask him to come back in for a second sleep study with CPAP treatment. For now, I would like to start him on a so-called autoPAP machine  at home, through a DME company (of his choice, or as per insurance requirement). The DME representative will educate him on how to use the machine, how to put the mask on, etc. I have placed an order  in the chart. Please send referral, talk to patient, send report to referring MD. We will need a FU in sleep clinic for 10 weeks post-PAP set up, please arrange that with me or one of our NPs.  Thanks,   True Mar, MD, PhD [facility])

## 2025-01-25 ENCOUNTER — Other Ambulatory Visit: Payer: Self-pay

## 2025-01-27 NOTE — Telephone Encounter (Signed)
 Cpap/Bipap supplies and written order faxed to [facility] on 01/26/2025

## 2025-01-30 ENCOUNTER — Encounter: Admitting: Dietician

## 2025-02-02 ENCOUNTER — Encounter: Admitting: Dietician

## 2025-02-02 ENCOUNTER — Encounter: Payer: Self-pay | Admitting: Dietician

## 2025-02-02 VITALS — Ht 70.0 in | Wt 304.5 lb

## 2025-02-02 DIAGNOSIS — E669 Obesity, unspecified: Secondary | ICD-10-CM

## 2025-02-02 NOTE — Progress Notes (Signed)
 Supervised Weight Loss Visit Bariatric Nutrition Education Appt Start Time:  1527   End Time: 1550  Planned surgery: RNY Gastric By-Pass Pt expectation of surgery: to lose at least 80 lbs  Referral stated Supervised Weight Loss (SWL) visits needed: 6 Months  3 out of 6 SWL Remaining Appointments   Planned surgery: RNY Gastric By-Pass Pt expectation of surgery: to lose at least 80 lbs  NUTRITION ASSESSMENT   Anthropometrics  Start weight at [facility]: 307.7 lbs (date: 09/13/2024)  Height: 70 in Weight today: 304.5 lbs BMI: 43.69 kg/m2     Clinical  Medical hx: cancer, obesity, HTN Medications: losartan , xtandi  Labs: sodium 133, glucose 119, hemoglobin 13.3, hematocrit 39.0, anion gap 5 Notable signs/symptoms: non noted Any previous deficiencies? No  Lifestyle & Dietary Hx  Pt states he just got back from [place] , stating he is from [place], [place]. Pt states there was more snow down there than there is here, stating he was snowed in for a few days. Pt states the past few days have been hectic and hasn't been consistent. Pt states he didn't overdue it, but states he did splurge some. Pt states his sister was supportive, stating she knows he is getting the bariatric surgery. Pt states he is ready to get back on track since getting back from [place] . Pt states he has not started tracking protein, stating he is ready for that to be his next goal.  Estimated daily fluid intake: 64 oz (water ) Supplements: n/a Current average weekly physical activity: ADLs  24-Hr Dietary Recall First Meal: 9-10 am, bowl of cereal (frosted flakes) Snack: nutri-grain bar (to avoid alternate snack options) Second Meal: sandwich (toasted bread or grilled cheese or grilled turkey sandwich) Snack:  Third Meal: meat, mashed potatoes, vegetable (meals on wheel type food that his wife doesn't eat) Snack: rice crispy treat Beverages: water   Alcoholic beverages per week: 0   Estimated Energy  Needs Calories: 1700  NUTRITION DIAGNOSIS  Overweight/obesity ([place]-3.3) related to past poor dietary habits and physical inactivity as evidenced by patient w/ planned gastric by-pass surgery following dietary guidelines for continued weight loss.  NUTRITION INTERVENTION  Nutrition counseling (C-1) and education (E-2) to facilitate bariatric surgery goals.  Pre-Op Goals Reviewed with the Patient  Tracking your protein with a goal of about 80 grams per day helps you stay consistent and understand how much youre truly getting from food. A simple guideline is that 1 ounce of cooked meat provides roughly 7 grams of protein, and you can estimate a 3-ounce portion--about 21 grams--by comparing it to the size of a deck of cards. Using the Nutrition Facts label makes tracking easier, since it shows protein per serving and helps you adjust portions as needed. This approach keeps your intake balanced and grounded in real food rather than relying too heavily on supplements.  Protein plays a central role both before and after bariatric surgery because it supports healing, preserves muscle mass, and helps you stay fuller on smaller portions. Before surgery, building the habit of prioritizing protein prepares you for the structured eating pattern youll need afterward. After surgery, when your intake is limited, protein becomes even more important for recovery, maintaining strength, and keeping your metabolism steady. Making protein the anchor of each meal or snack sets you up for long-term success with your nutrition goals.  Pre-Op Goals Progress & New Goals Continue: Eliminate distractions while eating (TV, computer, reading, driving, texting) Continue: Take 20-30 minutes to eat a meal  Continue: aiming  for 64 ounces of water  Continue: eat every 3-5 hours; use meal planning and prepping to execute  Continue: aim for a protein with every meal or snack, to help get to goal for the day. New: track protein with  a goal of 80 grams per day; 1 oz of meat = 7 grams of protein; deck of cards estimated to be 3 oz of meat; use nutrition facts to calculate protein  Handouts Provided Include  Goals Printed  Learning Style & Readiness for Change Teaching method utilized: Visual & Auditory  Demonstrated degree of understanding via: Teach Back  Readiness Level: preparation Barriers to learning/adherence to lifestyle change: reduced physical activity  RD's Notes for next Visit  Patient progress toward chosen goals  MONITORING & EVALUATION Dietary intake, weekly physical activity, body weight, and pre-op goals. This note has been created using automated tools and reviewed for accuracy.    Next Steps  Patient is to return to [facility] in 1 month for next SWL visit.

## 2025-03-02 ENCOUNTER — Encounter: Admitting: Dietician

## 2025-04-12 ENCOUNTER — Encounter: Admitting: Neurology
